# Patient Record
Sex: Male | Born: 1955 | Hispanic: No | Marital: Married | Smoking: Former smoker
Health system: Southern US, Community
[De-identification: ages and names within clinical notes are randomized; demographics above are authoritative.]

## PROBLEM LIST (undated history)

## (undated) DIAGNOSIS — K56609 Unspecified intestinal obstruction, unspecified as to partial versus complete obstruction: Secondary | ICD-10-CM

## (undated) DIAGNOSIS — I10 Essential (primary) hypertension: Secondary | ICD-10-CM

## (undated) DIAGNOSIS — E1110 Type 2 diabetes mellitus with ketoacidosis without coma: Secondary | ICD-10-CM

## (undated) DIAGNOSIS — N179 Acute kidney failure, unspecified: Secondary | ICD-10-CM

## (undated) DIAGNOSIS — E119 Type 2 diabetes mellitus without complications: Secondary | ICD-10-CM

## (undated) DIAGNOSIS — E785 Hyperlipidemia, unspecified: Secondary | ICD-10-CM

## (undated) HISTORY — PX: ESOPHAGOGASTRODUODENOSCOPY ENDOSCOPY: SHX5814

## (undated) HISTORY — PX: TONSILLECTOMY: SUR1361

## (undated) HISTORY — PX: WRIST SURGERY: SHX841

---

## 1998-05-31 ENCOUNTER — Other Ambulatory Visit: Admission: RE | Admit: 1998-05-31 | Discharge: 1998-05-31 | Payer: Self-pay | Admitting: Emergency Medicine

## 2009-01-18 ENCOUNTER — Inpatient Hospital Stay (HOSPITAL_COMMUNITY): Admission: EM | Admit: 2009-01-18 | Discharge: 2009-01-19 | Payer: Self-pay | Admitting: Emergency Medicine

## 2011-02-26 LAB — LIPID PANEL
HDL: 42 mg/dL (ref >39)
LDL Cholesterol: 91 mg/dL (ref 0–99)
Total CHOL/HDL Ratio: 3.7 RATIO
Triglycerides: 106 mg/dL (ref <150)
VLDL: 21 mg/dL (ref 0–40)

## 2011-02-26 LAB — BASIC METABOLIC PANEL
GFR calc Af Amer: >60 mL/min (ref >60)
GFR calc non Af Amer: >60 mL/min (ref >60)
Potassium: 4.2 mEq/L (ref 3.5–5.1)
Sodium: 139 mEq/L (ref 135–145)

## 2011-02-26 LAB — CBC
HCT: 42.5 % (ref 39.0–52.0)
Hemoglobin: 14.7 g/dL (ref 13.0–17.0)
RBC: 4.86 MIL/uL (ref 4.22–5.81)
WBC: 4.8 10*3/uL (ref 4.0–10.5)

## 2011-02-26 LAB — GLUCOSE, CAPILLARY: Glucose-Capillary: 118 mg/dL — ABNORMAL HIGH (ref 70–99)

## 2011-02-26 LAB — CK TOTAL AND CKMB (NOT AT ARMC)
CK, MB: 1 ng/mL (ref 0.3–4.0)
Total CK: 123 U/L (ref 7–232)

## 2011-02-26 LAB — HEMOGLOBIN A1C
Hgb A1c MFr Bld: 6 % (ref 4.6–6.1)
Mean Plasma Glucose: 126 mg/dL

## 2011-02-26 LAB — DIFFERENTIAL
Eosinophils Relative: 3 % (ref 0–5)
Lymphocytes Relative: 40 % (ref 12–46)
Lymphs Abs: 1.9 10*3/uL (ref 0.7–4.0)
Monocytes Absolute: 0.4 10*3/uL (ref 0.1–1.0)

## 2011-02-26 LAB — POCT I-STAT, CHEM 8
BUN: 7 mg/dL (ref 6–23)
Chloride: 106 mEq/L (ref 96–112)
HCT: 39 % (ref 39.0–52.0)
Sodium: 138 mEq/L (ref 135–145)
TCO2: 24 mmol/L (ref 0–100)

## 2011-02-26 LAB — CARDIAC PANEL(CRET KIN+CKTOT+MB+TROPI)
CK, MB: 0.8 ng/mL (ref 0.3–4.0)
Total CK: 110 U/L (ref 7–232)

## 2011-02-26 LAB — TSH: TSH: 1.925 u[IU]/mL (ref 0.350–4.500)

## 2011-02-26 LAB — BRAIN NATRIURETIC PEPTIDE: Pro B Natriuretic peptide (BNP): <30 pg/mL (ref 0.0–100.0)

## 2011-02-26 LAB — APTT: aPTT: 32 seconds (ref 24–37)

## 2011-02-26 LAB — POCT CARDIAC MARKERS: Troponin i, poc: <0.05 ng/mL (ref 0.00–0.09)

## 2011-03-31 NOTE — H&P (Signed)
NAME:  Jeremiah Bowman, Jeremiah Bowman NO.:  [id number]   MEDICAL RECORD NO.:  [id number]          PATIENT TYPE:  EMS   LOCATION:  [facility]JO                         FACILITY:  [facility]   PHYSICIAN:  Lucita Ferrara, MD         DATE OF BIRTH:  09/17/1956   DATE OF ADMISSION:  01/18/2009  DATE OF DISCHARGE:                              HISTORY & PHYSICAL   CHIEF COMPLAINT:  Chest pain.  The patient is a 55 year old African male  with chest pain located in the left anterior precordial area and  substernal area, nonradiating, it has been going on for the last 24  hours, worse upon exertion, aching, dull, pressure-like in its  characterization, 4/10 in intensity, not aggravated by p.o. intake,  there is no gastric symptoms.  No reproducibility or change with body  position.  No fevers, chills, cough.  No family history of coronary  artery disease; however, other risk factors include hypertension,  diabetes, hyperlipidemia.   FAMILY HISTORY:  Negative for premature coronary disease.   PAST SURGICAL HISTORY:  None.   SOCIAL HISTORY:  Nonsmoker, nondrinker.  Denies drugs.   ALLERGIES:  No known drug allergies.   MEDICATIONS:  Aspirin, lisinopril, metformin, Pravachol, Prevacid,  verapamil.   REVIEW OF SYSTEMS:  As per HPI, otherwise negative.   PHYSICAL EXAMINATION:  Generally speaking, the patient is in no acute  distress.  Blood pressure 155/95, pulse 76, respirations 23, temperature  98.1, pulse oximetry 95% on room air.  HEENT:  Normocephalic, atraumatic.  Sclerae is anicteric.  NECK:  Supple.  No JVD, no carotid bruits.  PERLA.  Extraocular muscles  intact.  CARDIOVASCULAR:  S1-S2, regular rate and rhythm.  No murmurs, rubs or  clicks.  LUNGS:  Clear to auscultation bilaterally.  No rhonchi, rales or wheeze.  ABDOMEN:  Obese, soft, nontender, nondistended.  Positive bowel sounds.  EXTREMITIES:  No clubbing, cyanosis or edema.  NEURO:  The patient is alert and oriented x3.  Cranial  nerves II-XII  grossly intact.   LABORATORY DATA:  Basic metabolic panel within normal limits.  Beta  natriuretic peptide less than 30.  Cardiac markers negative.   ASSESSMENT/PLAN:  The patient is a 55 year old with a really atypical  type of chest pain, relatively low risk.  The risk factors, however, do  include diabetes, hypertension which is not well-controlled.  Will go  ahead and admit the patient for observational stay for rule out  myocardial infarction, cardiac enzymes times three every 8 hours.  Will  put the patient on aspirin 325 mg, beta blocker 12.5 mg by mouth two  times daily.  The patient should be risk stratified.  The patient would  likely benefit from an outpatient stress test prior to leaving the  hospital.  Deep vein thrombosis prophylaxis with Lovenox, GI prophylaxis  with Protonix.      Lucita Ferrara, MD  Electronically Signed     RR/MEDQ  D:  01/18/2009  T:  01/18/2009  Job:  [id number]

## 2011-03-31 NOTE — Discharge Summary (Signed)
NAME:  Jeremiah Bowman, Jeremiah Bowman NO.:  [id number]   MEDICAL RECORD NO.:  [id number]          PATIENT TYPE:  INP   LOCATION:  4705                         FACILITY:  [facility]   PHYSICIAN:  Peggye Pitt, M.D. DATE OF BIRTH:  Jan 30, 1956   DATE OF ADMISSION:  01/18/2009  DATE OF DISCHARGE:  01/19/2009                               DISCHARGE SUMMARY   DISCHARGE DIAGNOSES:  1. Chest pain, ruled out for acute myocardial infarction.  2. Type 2 diabetes mellitus.  3. Hypertension.  4. Hyperlipidemia.   DISCHARGE MEDICATIONS:  1. Aspirin 81 mg daily.  2. Verapamil 180 mg daily.  3. Lisinopril/Hydrochlorothiazide 20/12.5 mg daily.  4. Metformin 1000 mg twice daily.  5. Pravachol 20 mg daily.   DISPOSITION AND FOLLOWUP:  Jeremiah Bowman is discharged home in stable  condition.  He has no longer had any chest pain.  Given his risk  factors, I would like him to have an outpatient stress test and I have  called Dr. Verdis Prime to schedule this.  I am still waiting for a call  back by him.  Nonetheless, the patient has been givien Dr. Michaelle Copas  office number [phone] to call on Monday and make sure that this  appointment has been arranged for him.   CONSULTATION THIS HOSPITALIZATION:  None.   IMAGES AND PROCEDURES PERFORMED:  Chest x-ray on January 18, 2009, that  showed no acute cardiopulmonary abnormality.   HISTORY AND PHYSICAL EXAMINATION:  For full details refer the dictation  by Dr. Purcell Mouton on January 18, 2009, but in brief, Jeremiah Bowman is a 55-year-  old African American man who presented to the hospital with chest pain  located in the left anterior precordial area, which was nonradiating  which have been going on for the prior 24 hours, intermittent, no  aggravating or alleviating factors.  He states that he has had this  chest pain on and off for the past month.  His coronary artery disease  risk factors include hypertension, hyperlipidemia, and diabetes.   HOSPITAL COURSE BY  PROBLEM:  1. Chest pain.  He has ruled out for an acute myocardial infarction by      3 sets of negative cardiac enzymes as well as a completely normal      EKG with no acute ST-T wave changes.  However, given a numerous      coronary artery disease risk factors, I have recommended that he      get an outpatient stress test.  I have called the cardiologist on      call Dr. Katrinka Blazing and are currently waiting for a call back.  In the      meantime, I have given Jeremiah Bowman, Dr. Michaelle Copas office number to      call on Monday and make sure this appointment has been arranged for      him.  2. Hyperlipidemia.  He has had an excellent fasting lipid profile      while in the hospital.  We continued his statin at home dose.  His      fasting lipid profile  was as followed, total cholesterol 154,      triglycerides 106, HDL 42, and an LDL of 91.  3. Hypertension.  He has obtained blood pressure control in the      hospital.  Continue on his home medications.  4. Type 2 diabetes mellitus, which is very well controlled with the      hemoglobin A1c of 6.0.  He will continue his metformin in the      outpatient setting.   VITAL SIGNS ON DAY OF DISCHARGE:  Blood pressure 123/82, heart rate 82,  respirations 18, O2 sat 95% on room air with temp of 98.4.      Peggye Pitt, M.D.  Electronically Signed     EH/MEDQ  D:  01/19/2009  T:  01/20/2009  Job:  [id number]   cc:   Maren Beach, M.D.

## 2013-03-01 ENCOUNTER — Encounter: Payer: Self-pay | Admitting: Gastroenterology

## 2013-05-01 ENCOUNTER — Encounter: Payer: Self-pay | Admitting: Gastroenterology

## 2013-05-24 ENCOUNTER — Encounter: Payer: Self-pay | Admitting: Gastroenterology

## 2015-10-17 DIAGNOSIS — K56609 Unspecified intestinal obstruction, unspecified as to partial versus complete obstruction: Secondary | ICD-10-CM

## 2015-10-17 HISTORY — DX: Unspecified intestinal obstruction, unspecified as to partial versus complete obstruction: K56.609

## 2015-10-24 ENCOUNTER — Inpatient Hospital Stay (HOSPITAL_COMMUNITY)
Admission: EM | Admit: 2015-10-24 | Discharge: 2015-10-27 | DRG: 388 | Disposition: A | Discharge status: Home / Self Care | Payer: Self-pay | Attending: Internal Medicine | Admitting: Internal Medicine

## 2015-10-24 ENCOUNTER — Encounter (HOSPITAL_COMMUNITY): Payer: Self-pay

## 2015-10-24 ENCOUNTER — Emergency Department (HOSPITAL_COMMUNITY): Payer: Self-pay

## 2015-10-24 DIAGNOSIS — E872 Acidosis, unspecified: Secondary | ICD-10-CM

## 2015-10-24 DIAGNOSIS — K56609 Unspecified intestinal obstruction, unspecified as to partial versus complete obstruction: Secondary | ICD-10-CM | POA: Diagnosis present

## 2015-10-24 DIAGNOSIS — K922 Gastrointestinal hemorrhage, unspecified: Secondary | ICD-10-CM | POA: Diagnosis present

## 2015-10-24 DIAGNOSIS — A419 Sepsis, unspecified organism: Secondary | ICD-10-CM | POA: Diagnosis present

## 2015-10-24 DIAGNOSIS — Z87891 Personal history of nicotine dependence: Secondary | ICD-10-CM

## 2015-10-24 DIAGNOSIS — E860 Dehydration: Secondary | ICD-10-CM | POA: Diagnosis present

## 2015-10-24 DIAGNOSIS — E669 Obesity, unspecified: Secondary | ICD-10-CM | POA: Diagnosis present

## 2015-10-24 DIAGNOSIS — R109 Unspecified abdominal pain: Secondary | ICD-10-CM | POA: Diagnosis present

## 2015-10-24 DIAGNOSIS — Z6841 Body Mass Index (BMI) 40.0 and over, adult: Secondary | ICD-10-CM

## 2015-10-24 DIAGNOSIS — K56600 Partial intestinal obstruction, unspecified as to cause: Secondary | ICD-10-CM | POA: Insufficient documentation

## 2015-10-24 DIAGNOSIS — R339 Retention of urine, unspecified: Secondary | ICD-10-CM | POA: Diagnosis present

## 2015-10-24 DIAGNOSIS — Z0189 Encounter for other specified special examinations: Secondary | ICD-10-CM

## 2015-10-24 DIAGNOSIS — R112 Nausea with vomiting, unspecified: Secondary | ICD-10-CM | POA: Diagnosis present

## 2015-10-24 DIAGNOSIS — R6520 Severe sepsis without septic shock: Secondary | ICD-10-CM | POA: Diagnosis present

## 2015-10-24 DIAGNOSIS — R338 Other retention of urine: Secondary | ICD-10-CM | POA: Diagnosis present

## 2015-10-24 DIAGNOSIS — E785 Hyperlipidemia, unspecified: Secondary | ICD-10-CM | POA: Diagnosis present

## 2015-10-24 DIAGNOSIS — F1020 Alcohol dependence, uncomplicated: Secondary | ICD-10-CM | POA: Diagnosis present

## 2015-10-24 DIAGNOSIS — D72829 Elevated white blood cell count, unspecified: Secondary | ICD-10-CM | POA: Diagnosis present

## 2015-10-24 DIAGNOSIS — I10 Essential (primary) hypertension: Secondary | ICD-10-CM | POA: Diagnosis present

## 2015-10-24 DIAGNOSIS — E119 Type 2 diabetes mellitus without complications: Secondary | ICD-10-CM

## 2015-10-24 DIAGNOSIS — I959 Hypotension, unspecified: Secondary | ICD-10-CM | POA: Diagnosis present

## 2015-10-24 DIAGNOSIS — N179 Acute kidney failure, unspecified: Secondary | ICD-10-CM

## 2015-10-24 HISTORY — DX: Essential (primary) hypertension: I10

## 2015-10-24 HISTORY — DX: Hyperlipidemia, unspecified: E78.5

## 2015-10-24 HISTORY — DX: Type 2 diabetes mellitus without complications: E11.9

## 2015-10-24 HISTORY — DX: Unspecified intestinal obstruction, unspecified as to partial versus complete obstruction: K56.609

## 2015-10-24 LAB — CBC WITH DIFFERENTIAL/PLATELET
BASOS ABS: 0 10*3/uL (ref 0.0–0.1)
BASOS ABS: 0 10*3/uL (ref 0.0–0.1)
Basophils Relative: 0 %
Basophils Relative: 0 %
EOS ABS: 0 10*3/uL (ref 0.0–0.7)
Eosinophils Absolute: 0.1 10*3/uL (ref 0.0–0.7)
Eosinophils Relative: 0 %
Eosinophils Relative: 1 %
HCT: 48 % (ref 39.0–52.0)
HEMATOCRIT: 39.2 % (ref 39.0–52.0)
Hemoglobin: 16.5 g/dL (ref 13.0–17.0)
Hemoglobin: 13.6 g/dL (ref 13.0–17.0)
LYMPHS ABS: 0.8 10*3/uL (ref 0.7–4.0)
LYMPHS ABS: 0.9 10*3/uL (ref 0.7–4.0)
Lymphocytes Relative: 6 %
Lymphocytes Relative: 10 %
MCH: 29.8 pg (ref 26.0–34.0)
MCH: 29.8 pg (ref 26.0–34.0)
MCHC: 34.4 g/dL (ref 30.0–36.0)
MCHC: 34.7 g/dL (ref 30.0–36.0)
MCV: 86.8 fL (ref 78.0–100.0)
MCV: 86 fL (ref 78.0–100.0)
MONO ABS: 1.5 10*3/uL — AB (ref 0.1–1.0)
MONOS PCT: 11 %
Monocytes Absolute: 1.4 10*3/uL — ABNORMAL HIGH (ref 0.1–1.0)
Monocytes Relative: 17 %
Neutro Abs: 10.8 10*3/uL — ABNORMAL HIGH (ref 1.7–7.7)
Neutro Abs: 6.4 10*3/uL (ref 1.7–7.7)
Neutrophils Relative %: 83 %
Neutrophils Relative %: 72 %
PLATELETS: 320 10*3/uL (ref 150–400)
Platelets: 230 10*3/uL (ref 150–400)
RBC: 5.53 MIL/uL (ref 4.22–5.81)
RBC: 4.56 MIL/uL (ref 4.22–5.81)
RDW: 13.4 % (ref 11.5–15.5)
RDW: 13.5 % (ref 11.5–15.5)
WBC Morphology: INCREASED
WBC Morphology: INCREASED
WBC: 13 10*3/uL — AB (ref 4.0–10.5)
WBC: 8.9 10*3/uL (ref 4.0–10.5)

## 2015-10-24 LAB — PROTIME-INR
INR: 1.19 (ref 0.00–1.49)
INR: 1.16 (ref 0.00–1.49)
PROTHROMBIN TIME: 15.2 s (ref 11.6–15.2)
PROTHROMBIN TIME: 15 s (ref 11.6–15.2)

## 2015-10-24 LAB — COMPREHENSIVE METABOLIC PANEL
ALT: 34 U/L (ref 17–63)
AST: 31 U/L (ref 15–41)
Albumin: 4.3 g/dL (ref 3.5–5.0)
Alkaline Phosphatase: 75 U/L (ref 38–126)
Anion gap: 19 — ABNORMAL HIGH (ref 5–15)
BILIRUBIN TOTAL: 1.7 mg/dL — AB (ref 0.3–1.2)
BUN: 27 mg/dL — ABNORMAL HIGH (ref 6–20)
CHLORIDE: 97 mmol/L — AB (ref 101–111)
CO2: 21 mmol/L — ABNORMAL LOW (ref 22–32)
CREATININE: 1.98 mg/dL — AB (ref 0.61–1.24)
Calcium: 11 mg/dL — ABNORMAL HIGH (ref 8.9–10.3)
GFR, EST AFRICAN AMERICAN: 41 mL/min — AB (ref >60)
GFR, EST NON AFRICAN AMERICAN: 35 mL/min — AB (ref >60)
Glucose, Bld: 413 mg/dL — ABNORMAL HIGH (ref 65–99)
POTASSIUM: 4.1 mmol/L (ref 3.5–5.1)
Sodium: 137 mmol/L (ref 135–145)
TOTAL PROTEIN: 8.8 g/dL — AB (ref 6.5–8.1)

## 2015-10-24 LAB — BASIC METABOLIC PANEL
ANION GAP: 8 (ref 5–15)
BUN: 20 mg/dL (ref 6–20)
CALCIUM: 9.4 mg/dL (ref 8.9–10.3)
CO2: 27 mmol/L (ref 22–32)
Chloride: 109 mmol/L (ref 101–111)
Creatinine, Ser: 1.13 mg/dL (ref 0.61–1.24)
Glucose, Bld: 200 mg/dL — ABNORMAL HIGH (ref 65–99)
POTASSIUM: 3.7 mmol/L (ref 3.5–5.1)
Sodium: 144 mmol/L (ref 135–145)

## 2015-10-24 LAB — POC OCCULT BLOOD, ED: FECAL OCCULT BLD: NEGATIVE

## 2015-10-24 LAB — T4, FREE: FREE T4: 0.94 ng/dL (ref 0.61–1.12)

## 2015-10-24 LAB — TYPE AND SCREEN
ABO/RH(D): A POS
Antibody Screen: NEGATIVE

## 2015-10-24 LAB — I-STAT CHEM 8, ED
BUN: 31 mg/dL — AB (ref 6–20)
CHLORIDE: 98 mmol/L — AB (ref 101–111)
Calcium, Ion: 1.13 mmol/L (ref 1.13–1.30)
Creatinine, Ser: 1.7 mg/dL — ABNORMAL HIGH (ref 0.61–1.24)
Glucose, Bld: 431 mg/dL — ABNORMAL HIGH (ref 65–99)
HEMATOCRIT: 54 % — AB (ref 39.0–52.0)
Hemoglobin: 18.4 g/dL — ABNORMAL HIGH (ref 13.0–17.0)
Potassium: 3.8 mmol/L (ref 3.5–5.1)
SODIUM: 137 mmol/L (ref 135–145)
TCO2: 23 mmol/L (ref 0–100)

## 2015-10-24 LAB — LACTIC ACID, PLASMA
LACTIC ACID, VENOUS: 2.3 mmol/L — AB (ref 0.5–2.0)
LACTIC ACID, VENOUS: 1.6 mmol/L (ref 0.5–2.0)

## 2015-10-24 LAB — MRSA PCR SCREENING: MRSA BY PCR: NEGATIVE

## 2015-10-24 LAB — I-STAT CG4 LACTIC ACID, ED
LACTIC ACID, VENOUS: 5.78 mmol/L — AB (ref 0.5–2.0)
LACTIC ACID, VENOUS: 2.46 mmol/L — AB (ref 0.5–2.0)
Lactic Acid, Venous: 5.9 mmol/L (ref 0.5–2.0)

## 2015-10-24 LAB — GLUCOSE, CAPILLARY: Glucose-Capillary: 160 mg/dL — ABNORMAL HIGH (ref 65–99)

## 2015-10-24 LAB — ABO/RH: ABO/RH(D): A POS

## 2015-10-24 LAB — TSH: TSH: 1.16 u[IU]/mL (ref 0.350–4.500)

## 2015-10-24 LAB — APTT: aPTT: 24 seconds (ref 24–37)

## 2015-10-24 LAB — CBG MONITORING, ED: Glucose-Capillary: 206 mg/dL — ABNORMAL HIGH (ref 65–99)

## 2015-10-24 LAB — LIPASE, BLOOD: LIPASE: 26 U/L (ref 11–51)

## 2015-10-24 LAB — PROCALCITONIN: PROCALCITONIN: 1.05 ng/mL

## 2015-10-24 MED ORDER — MORPHINE SULFATE (PF) 2 MG/ML IV SOLN
2.0000 mg | INTRAVENOUS | Status: DC | PRN
  Administered 2015-10-25: 2 mg via INTRAVENOUS
  Filled 2015-10-24: qty 1

## 2015-10-24 MED ORDER — IOHEXOL 300 MG/ML  SOLN
25.0000 mL | Freq: Once | INTRAMUSCULAR | Status: AC | PRN
  Administered 2015-10-24: 25 mL via ORAL

## 2015-10-24 MED ORDER — SODIUM CHLORIDE 0.9 % IV BOLUS (SEPSIS)
1000.0000 mL | Freq: Once | INTRAVENOUS | Status: AC
  Administered 2015-10-24: 1000 mL via INTRAVENOUS

## 2015-10-24 MED ORDER — PIPERACILLIN-TAZOBACTAM 3.375 G IVPB 30 MIN
3.3750 g | Freq: Once | INTRAVENOUS | Status: AC
  Administered 2015-10-24: 3.375 g via INTRAVENOUS
  Filled 2015-10-24: qty 50

## 2015-10-24 MED ORDER — METOPROLOL TARTRATE 1 MG/ML IV SOLN
2.5000 mg | INTRAVENOUS | Status: DC | PRN
  Administered 2015-10-25: 2.5 mg via INTRAVENOUS
  Filled 2015-10-24: qty 5

## 2015-10-24 MED ORDER — SODIUM CHLORIDE 0.9 % IV BOLUS (SEPSIS)
1000.0000 mL | Freq: Once | INTRAVENOUS | Status: AC
Start: 2015-10-24 — End: 2015-10-24
  Administered 2015-10-24: 1000 mL via INTRAVENOUS

## 2015-10-24 MED ORDER — LACTATED RINGERS IV SOLN
INTRAVENOUS | Status: DC
  Administered 2015-10-24: 17:00:00 via INTRAVENOUS

## 2015-10-24 MED ORDER — PHENOL 1.4 % MT LIQD
1.0000 | OROMUCOSAL | Status: DC | PRN
  Filled 2015-10-24: qty 177

## 2015-10-24 MED ORDER — MORPHINE SULFATE (PF) 4 MG/ML IV SOLN
4.0000 mg | Freq: Once | INTRAVENOUS | Status: AC
  Administered 2015-10-24: 4 mg via INTRAVENOUS
  Filled 2015-10-24: qty 1

## 2015-10-24 MED ORDER — THIAMINE HCL 100 MG/ML IJ SOLN
100.0000 mg | Freq: Every day | INTRAMUSCULAR | Status: DC
  Administered 2015-10-25 – 2015-10-26 (×2): 100 mg via INTRAVENOUS
  Filled 2015-10-24 (×2): qty 2

## 2015-10-24 MED ORDER — INSULIN ASPART 100 UNIT/ML ~~LOC~~ SOLN
0.0000 [IU] | SUBCUTANEOUS | Status: DC
  Administered 2015-10-24: 3 [IU] via SUBCUTANEOUS
  Administered 2015-10-24: 2 [IU] via SUBCUTANEOUS
  Administered 2015-10-25: 1 [IU] via SUBCUTANEOUS
  Administered 2015-10-25 (×2): 2 [IU] via SUBCUTANEOUS
  Administered 2015-10-25: 1 [IU] via SUBCUTANEOUS
  Administered 2015-10-25 (×2): 2 [IU] via SUBCUTANEOUS
  Administered 2015-10-26: 1 [IU] via SUBCUTANEOUS

## 2015-10-24 MED ORDER — SODIUM CHLORIDE 0.9 % IV SOLN
INTRAVENOUS | Status: DC
  Administered 2015-10-24 – 2015-10-26 (×3): via INTRAVENOUS
  Administered 2015-10-26: 1 mL via INTRAVENOUS
  Administered 2015-10-26: 13:00:00 via INTRAVENOUS

## 2015-10-24 MED ORDER — PIPERACILLIN-TAZOBACTAM 3.375 G IVPB 30 MIN: 3.3750 g | Freq: Once | INTRAVENOUS | Status: DC

## 2015-10-24 MED ORDER — LORAZEPAM 2 MG/ML IJ SOLN: 1.0000 mg | Freq: Four times a day (QID) | INTRAMUSCULAR | Status: DC | PRN

## 2015-10-24 MED ORDER — VITAMIN B-1 100 MG PO TABS
100.0000 mg | ORAL_TABLET | Freq: Every day | ORAL | Status: DC
  Administered 2015-10-27: 100 mg via ORAL
  Filled 2015-10-24: qty 1

## 2015-10-24 MED ORDER — ONDANSETRON HCL 4 MG/2ML IJ SOLN
4.0000 mg | Freq: Once | INTRAMUSCULAR | Status: AC
  Administered 2015-10-24: 4 mg via INTRAVENOUS
  Filled 2015-10-24: qty 2

## 2015-10-24 MED ORDER — PIPERACILLIN-TAZOBACTAM 3.375 G IVPB
3.3750 g | Freq: Three times a day (TID) | INTRAVENOUS | Status: DC
  Administered 2015-10-24 – 2015-10-26 (×6): 3.375 g via INTRAVENOUS
  Filled 2015-10-24 (×8): qty 50

## 2015-10-24 MED ORDER — INSULIN ASPART 100 UNIT/ML ~~LOC~~ SOLN: 0.0000 [IU] | Freq: Three times a day (TID) | SUBCUTANEOUS | Status: DC

## 2015-10-24 MED ORDER — DIATRIZOATE MEGLUMINE & SODIUM 66-10 % PO SOLN
ORAL | Status: AC
  Administered 2015-10-24: 16:00:00
  Filled 2015-10-24: qty 90

## 2015-10-24 MED ORDER — DIATRIZOATE MEGLUMINE & SODIUM 66-10 % PO SOLN
90.0000 mL | Freq: Once | ORAL | Status: AC
  Administered 2015-10-24: 90 mL via NASOGASTRIC

## 2015-10-24 MED ORDER — LORAZEPAM 1 MG PO TABS: 1.0000 mg | ORAL_TABLET | Freq: Four times a day (QID) | ORAL | Status: DC | PRN

## 2015-10-24 MED ORDER — PANTOPRAZOLE SODIUM 40 MG IV SOLR
80.0000 mg | Freq: Once | INTRAVENOUS | Status: AC
  Administered 2015-10-24: 80 mg via INTRAVENOUS
  Filled 2015-10-24: qty 80

## 2015-10-24 MED ORDER — ONDANSETRON HCL 4 MG/2ML IJ SOLN
4.0000 mg | Freq: Four times a day (QID) | INTRAMUSCULAR | Status: DC | PRN
  Administered 2015-10-26: 4 mg via INTRAVENOUS
  Filled 2015-10-24 (×2): qty 2

## 2015-10-24 MED ORDER — HEPARIN SODIUM (PORCINE) 5000 UNIT/ML IJ SOLN
5000.0000 [IU] | Freq: Three times a day (TID) | INTRAMUSCULAR | Status: DC
  Administered 2015-10-24 – 2015-10-27 (×9): 5000 [IU] via SUBCUTANEOUS
  Filled 2015-10-24 (×8): qty 1

## 2015-10-24 MED ORDER — ONDANSETRON HCL 4 MG PO TABS: 4.0000 mg | ORAL_TABLET | Freq: Four times a day (QID) | ORAL | Status: DC | PRN

## 2015-10-24 MED ORDER — INSULIN ASPART 100 UNIT/ML ~~LOC~~ SOLN: 10.0000 [IU] | Freq: Once | SUBCUTANEOUS | Status: DC

## 2015-10-24 NOTE — ED Notes (Signed)
General surgery at bedside. 

## 2015-10-24 NOTE — ED Notes (Signed)
Shown lactic acid to dr.goldston

## 2015-10-24 NOTE — ED Provider Notes (Signed)
CSN: [id number]     Arrival date & time 10/24/15  0940 History   First MD Initiated Contact with Patient 10/24/15 1000     Chief Complaint  Patient presents with  . Hypotension  . Emesis     (Consider location/radiation/quality/duration/timing/severity/associated sxs/prior Treatment) HPI  59year-old male presents with abdominal pain x 2 days. He states that the pain is epigastric and is also expressing heartburn. Today he has vomited twice, both times the emesis was black. No bowel movement in the last 2 days. He went to urgent care but was sent here because they could not get a blood pressure. He started having diaphoresis and worse dizziness today. Patient states he drinks about 8 shots of brandy per week. Denies any known liver problems. Has been using ibuprofen intermittently over the past 1 week for a headache, states he uses 2 tablets about once per day over a few days. Denies any known stomach problems.  Past Medical History  Diagnosis Date  . Diabetes ([place])   . Hypertension   . Hyperlipidemia    History reviewed. No pertinent past surgical history. History reviewed. No pertinent family history. Social History  Substance Use Topics  . Smoking status: Former Research scientist (life sciences)  . Smokeless tobacco: None  . Alcohol Use: Yes     Comment: 8 shots of brandy a week    Review of Systems  Constitutional: Positive for diaphoresis. Negative for fever.  Respiratory: Negative for shortness of breath.   Gastrointestinal: Positive for nausea, vomiting, abdominal pain, constipation and abdominal distention.  Neurological: Positive for dizziness. Negative for syncope.  All other systems reviewed and are negative.     Allergies  Review of patient's allergies indicates no known allergies.  Home Medications   Prior to Admission medications   Not on File   BP 118/108 mmHg  Pulse 134  Temp(Src) 98.1 F (36.7 C) (Oral)  Resp 13  Ht 5' (1.524 m)  Wt 210 lb (95.255 kg)  BMI 41.01 kg/m2   SpO2 97% Physical Exam  Constitutional: He is oriented to person, place, and time. He appears well-developed and well-nourished. No distress.  HENT:  Head: Normocephalic and atraumatic.  Right Ear: External ear normal.  Left Ear: External ear normal.  Nose: Nose normal.  Eyes: Right eye exhibits no discharge. Left eye exhibits no discharge.  Neck: Neck supple.  Cardiovascular: Regular rhythm, normal heart sounds and intact distal pulses.  Tachycardia present.   Pulmonary/Chest: Effort normal and breath sounds normal.  Abdominal: Soft. He exhibits distension. There is tenderness in the left upper quadrant. There is no rigidity.  Mild generalized tenderness, worst in LUQ  Musculoskeletal: He exhibits no edema.  Neurological: He is alert and oriented to person, place, and time.  Skin: Skin is warm. He is diaphoretic.  Nursing note and vitals reviewed.   ED Course  Procedures (including critical care time) Labs Review Labs Reviewed  COMPREHENSIVE METABOLIC PANEL - Abnormal; Notable for the following:    Chloride 97 (*)    CO2 21 (*)    Glucose, Bld 413 (*)    BUN 27 (*)    Creatinine, Ser 1.98 (*)    Calcium 11.0 (*)    Total Protein 8.8 (*)    Total Bilirubin 1.7 (*)    GFR calc non Af Amer 35 (*)    GFR calc Af Amer 41 (*)    Anion gap 19 (*)    All other components within normal limits  CBC WITH DIFFERENTIAL/PLATELET - Abnormal;  Notable for the following:    WBC 13.0 (*)    Neutro Abs 10.8 (*)    Monocytes Absolute 1.4 (*)    All other components within normal limits  I-STAT CG4 LACTIC ACID, ED - Abnormal; Notable for the following:    Lactic Acid, Venous 5.90 (*)    All other components within normal limits  I-STAT CHEM 8, ED - Abnormal; Notable for the following:    Chloride 98 (*)    BUN 31 (*)    Creatinine, Ser 1.70 (*)    Glucose, Bld 431 (*)    Hemoglobin 18.4 (*)    HCT 54.0 (*)    All other components within normal limits  I-STAT CG4 LACTIC ACID, ED -  Abnormal; Notable for the following:    Lactic Acid, Venous 5.78 (*)    All other components within normal limits  CULTURE, BLOOD (ROUTINE X 2)  CULTURE, BLOOD (ROUTINE X 2)  LIPASE, BLOOD  PROTIME-INR  LACTIC ACID, PLASMA  CBC WITH DIFFERENTIAL/PLATELET  LACTIC ACID, PLASMA  LACTIC ACID, PLASMA  PROCALCITONIN  PROTIME-INR  APTT  HEMOGLOBIN A1C  POC OCCULT BLOOD, ED  I-STAT CG4 LACTIC ACID, ED  TYPE AND SCREEN  ABO/RH    Imaging Review Ct Abdomen Pelvis Wo Contrast  10/24/2015  CLINICAL DATA:  Epigastric pain starting this morning. Coffee ground emesis. Diaphoresis. EXAM: CT ABDOMEN AND PELVIS WITHOUT CONTRAST TECHNIQUE: Multidetector CT imaging of the abdomen and pelvis was performed following the standard protocol without IV contrast. COMPARISON:  None. FINDINGS: Lower chest: Lung bases show subpleural nodules measuring up to 7 mm in the lateral segment right middle lobe (series 3, image 4). Minimal subsegmental atelectasis is seen dependently in both lower lobes. Heart size normal. No pericardial or pleural effusion. Nasogastric tube is seen in the distal esophagus. Hepatobiliary: Liver appears slightly decreased in attenuation diffusely. Liver and gallbladder are otherwise unremarkable. No biliary ductal dilatation. Pancreas: Negative. Spleen: Negative. Adrenals/Urinary Tract: Right adrenal gland is unremarkable. Thickening of the left adrenal gland. Kidneys and ureters are unremarkable. Bladder is grossly unremarkable. Stomach/Bowel: Stomach is distended with oral contrast. Duodenum is decompressed. Jejunum is dilated with contrast seen only in the proximal jejunum. There is decompression of small bowel in the left lower quadrant. A discrete transition point is difficult to see due to lack of oral contrast in this area. Remainder of the small bowel is markedly decompressed, as is the colon. Vascular/Lymphatic: Vascular structures are unremarkable. No pathologically enlarged lymph nodes.  Reproductive: Prostate is at the upper limits of normal in size. Other: Small amount of fluid is seen in the anterior left anatomic pelvis (series 2, image 71). Scattered haziness in the right-sided omentum (series 2, images 41 and 43), nonspecific. No nodularity. No free air. Musculoskeletal: No worrisome lytic or sclerotic lesions. IMPRESSION: 1. Small bowel obstruction with transition point likely in the distal jejunum. Please see discussion above. 2. Trace ascites. 3. Liver appears fatty. 4. Mild haziness in the right sided omentum, nonspecific. 5. Subpleural nodules in the lung bases, likely subpleural lymph nodes. If the patient is at high risk for bronchogenic carcinoma, follow-up chest CT at 3-55months is recommended. If the patient is at low risk for bronchogenic carcinoma, follow-up chest CT at 6-12 months is recommended. This recommendation follows the consensus statement: Guidelines for Management of Small Pulmonary Nodules Detected on CT Scans: A Statement from the [place] as published in Radiology 2005; 237:395-400. Electronically Signed   By: Lorin Picket M.D.  On: 10/24/2015 14:18   Dg Chest Port 1 View  10/24/2015  CLINICAL DATA:  Abdominal pain and vomiting EXAM: PORTABLE CHEST 1 VIEW COMPARISON:  01/18/2009 FINDINGS: Normal heart size and mediastinal contours. Low volumes without acute infiltrate or edema. No effusion or pneumothorax. No acute osseous findings. Gas below the diaphragm appears intragastric. IMPRESSION: Negative low volume chest. Electronically Signed   By: Monte Fantasia M.D.   On: 10/24/2015 10:38   I have personally reviewed and evaluated these images and lab results as part of my medical decision-making.   EKG Interpretation   Date/Time:  Thursday October 24 2015 09:52:29 EST Ventricular Rate:  134 PR Interval:  150 QRS Duration: 76 QT Interval:  280 QTC Calculation: 418 R Axis:   23 Text Interpretation:  Sinus tachycardia Ventricular premature  complex  Probable left atrial enlargement Low voltage, precordial leads Borderline  T wave abnormalities Baseline wander in lead(s) II III aVR aVF rate is  faster, afib gone compared to 2010 Confirmed by Makisha Marrin  MD, Vonn Sliger ([id number])  on 10/24/2015 10:03:27 AM      CRITICAL CARE Performed by: Sherwood Gambler T   Total critical care time: 50 minutes  Critical care time was exclusive of separately billable procedures and treating other patients.  Critical care was necessary to treat or prevent imminent or life-threatening deterioration.  Critical care was time spent personally by me on the following activities: development of treatment plan with patient and/or surrogate as well as nursing, discussions with consultants, evaluation of patient's response to treatment, examination of patient, obtaining history from patient or surrogate, ordering and performing treatments and interventions, ordering and review of laboratory studies, ordering and review of radiographic studies, pulse oximetry and re-evaluation of patient's condition.  MDM   Final diagnoses:  Lactic acidosis  Acute kidney injury ([place])    10:53 AM patient appears to have upper GI bleeding, concern for possible gastric rupture. Patient has some distention but no rigidity. He has mild diffuse tenderness, worst in his left upper quadrant. Chest x-ray shows possible free air although this could all just be gastric bubble. Given high concern with lactate of almost 6 and poor overall appearance, surgery was consulted. They recommend rechecking lactate after 3 L IV fluid bolus as well as placing an NG tube and getting a CT with oral contrast. They will evaluate the patient.  Patient CT scan shows small bowel obstruction is likely the cause of his abdominal distention and vomiting. Patient has not been hypotensive but has been quite tachycardic. His diaphoresis resolved, pain improved, and he appears much better after fluids and pain medicine.  A large volume of brown material has come out of his NG tube. He will continued to be resuscitated with IV fluids. Lactate is unchanged after the initial 3 L IV fluid bolus. He was given 2 more liters and placed on a lactated Ringer's infusion. Surgery has evaluated due to comorbidities requests medicine admit. Initially, patient was given antibiotics to cover an intra-abdominal source given concern for possible perforation, may not be necessary now that it seems the patient is more having fluid loss as the cause of his lactate and tachycardia. Admit to medicine in the step down unit.    Sherwood Gambler, MD 10/24/15 [phone]

## 2015-10-24 NOTE — Consult Note (Signed)
[name] 01/18/1956  [id number].   Requesting MD: Dr. Sherwood Gambler Chief Complaint/Reason for Consult: free air, abdominal pain HPI: This is a 59 yo [place] male who has lived in the [place] 38 yrs.  2 days ago he began having chest pain with heart burn.  He has initially no other symptoms.  He was passing minimal flatus and had not had a BM now in 2 days.  He awoke this morning around 0500am with horrible chest pain and heart burn.  He states that his abdomen was very distended and looked like he was pregnant.  He threw up twice prior to arrival in the [facility].  He states it was dark.  He denies seeing any bright red blood.  He threw up once more here.  He c/o bad pain and was somewhat diaphoretic according to the EDP.  He does take ibuprofen a couple of times a week only and a daily baby ASA.  He had a plain CXR which was normal.  We were asked to see the patient due to tachycardia, diaphoresis, and abdominal pain and distention.    ROS : Please see HPI, otherwise negative   History reviewed. No pertinent family history.  Past Medical History  Diagnosis Date  . Diabetes ([place])   . Hypertension   . Hyperlipidemia     Past Surgical History  Procedure Laterality Date  . Tonsillectomy    . Wrist surgery      Social History:  reports that he has quit smoking. He does not have any smokeless tobacco history on file. He reports that he drinks about 9.6 oz of alcohol per week. He reports that he does not use illicit drugs.  Allergies: No Known Allergies   (Not in a hospital admission)  Blood pressure 121/90, pulse 113, temperature 98.1 F (36.7 C), temperature source Oral, resp. rate 23, height 5' (1.524 m), weight 95.255 kg (210 lb), SpO2 94 %. Physical Exam: General: pleasant, obese black male who is laying in bed in NAD HEENT: head is normocephalic, atraumatic.  Sclera are noninjected.  PERRL.  Ears and nose without any masses or lesions.  Mouth is pink Heart: regular rhythm, but  tachycardic in 1 teens.  Normal s1,s2. No obvious murmurs, gallops, or rubs noted.  Palpable radial and pedal pulses bilaterally Lungs: CTAB, no wheezes, rhonchi, or rales noted.  Respiratory effort nonlabored Abd: soft, NT,  Less distended after NGT placed,  Hypoactive BS, no masses, hernias, or organomegaly.  NGT was placed in my presence.  Between his emesis bag of 600cc and the over 2000cc he immediately put out after his NGT was placed, the patient's abdominal pain dissipated and resolved and his HR decreased from the 130s to at that time the 110-120s and is now in the low 100s. MS: all 4 extremities are symmetrical with no cyanosis, clubbing, or edema. Skin: warm and dry with no masses, lesions, or rashes Psych: A&Ox3 with an appropriate affect.    Results for orders placed or performed during the hospital encounter of 10/24/15 (from the past 48 hour(s))  Comprehensive metabolic panel     Status: Abnormal   Collection Time: 10/24/15 10:09 AM  Result Value Ref Range   Sodium 137 135 - 145 mmol/L   Potassium 4.1 3.5 - 5.1 mmol/L    Comment: SPECIMEN HEMOLYZED. HEMOLYSIS MAY AFFECT INTEGRITY OF RESULTS.   Chloride 97 (L) 101 - 111 mmol/L   CO2 21 (L) 22 - 32 mmol/L   Glucose, Bld 413 (H)  65 - 99 mg/dL   BUN 27 (H) 6 - 20 mg/dL   Creatinine, Ser 1.98 (H) 0.61 - 1.24 mg/dL   Calcium 11.0 (H) 8.9 - 10.3 mg/dL   Total Protein 8.8 (H) 6.5 - 8.1 g/dL   Albumin 4.3 3.5 - 5.0 g/dL   AST 31 15 - 41 U/L   ALT 34 17 - 63 U/L   Alkaline Phosphatase 75 38 - 126 U/L   Total Bilirubin 1.7 (H) 0.3 - 1.2 mg/dL   GFR calc non Af Amer 35 (L) >60 mL/min   GFR calc Af Amer 41 (L) >60 mL/min    Comment: (NOTE) The eGFR has been calculated using the CKD EPI equation. This calculation has not been validated in all clinical situations. eGFR's persistently <60 mL/min signify possible Chronic Kidney Disease.    Anion gap 19 (H) 5 - 15  Lipase, blood     Status: None   Collection Time: 10/24/15 10:09 AM   Result Value Ref Range   Lipase 26 11 - 51 U/L  CBC with Differential     Status: Abnormal   Collection Time: 10/24/15 10:09 AM  Result Value Ref Range   WBC 13.0 (H) 4.0 - 10.5 K/uL   RBC 5.53 4.22 - 5.81 MIL/uL   Hemoglobin 16.5 13.0 - 17.0 g/dL   HCT 48.0 39.0 - 52.0 %   MCV 86.8 78.0 - 100.0 fL   MCH 29.8 26.0 - 34.0 pg   MCHC 34.4 30.0 - 36.0 g/dL   RDW 13.4 11.5 - 15.5 %   Platelets 320 150 - 400 K/uL   Neutrophils Relative % 83 %   Lymphocytes Relative 6 %   Monocytes Relative 11 %   Eosinophils Relative 0 %   Basophils Relative 0 %   Neutro Abs 10.8 (H) 1.7 - 7.7 K/uL   Lymphs Abs 0.8 0.7 - 4.0 K/uL   Monocytes Absolute 1.4 (H) 0.1 - 1.0 K/uL   Eosinophils Absolute 0.0 0.0 - 0.7 K/uL   Basophils Absolute 0.0 0.0 - 0.1 K/uL   RBC Morphology POLYCHROMASIA PRESENT    WBC Morphology INCREASED BANDS (>20% BANDS)    Smear Review LARGE PLATELETS PRESENT   Protime-INR     Status: None   Collection Time: 10/24/15 10:09 AM  Result Value Ref Range   Prothrombin Time 15.2 11.6 - 15.2 seconds   INR 1.19 0.00 - 1.49  Type and screen     Status: None   Collection Time: 10/24/15 10:09 AM  Result Value Ref Range   ABO/RH(D) A POS    Antibody Screen NEG    Sample Expiration 10/27/2015   ABO/Rh     Status: None   Collection Time: 10/24/15 10:09 AM  Result Value Ref Range   ABO/RH(D) A POS   I-stat Chem 8, ED     Status: Abnormal   Collection Time: 10/24/15 10:28 AM  Result Value Ref Range   Sodium 137 135 - 145 mmol/L   Potassium 3.8 3.5 - 5.1 mmol/L   Chloride 98 (L) 101 - 111 mmol/L   BUN 31 (H) 6 - 20 mg/dL   Creatinine, Ser 1.70 (H) 0.61 - 1.24 mg/dL   Glucose, Bld 431 (H) 65 - 99 mg/dL   Calcium, Ion 1.13 1.13 - 1.30 mmol/L   TCO2 23 0 - 100 mmol/L   Hemoglobin 18.4 (H) 13.0 - 17.0 g/dL   HCT 54.0 (H) 39.0 - 52.0 %  I-Stat CG4 Lactic Acid, ED  Status: Abnormal   Collection Time: 10/24/15 10:29 AM  Result Value Ref Range   Lactic Acid, Venous 5.90 (HH) 0.5 -  2.0 mmol/L   Comment NOTIFIED PHYSICIAN   POC occult blood, ED RN will collect     Status: None   Collection Time: 10/24/15 11:43 AM  Result Value Ref Range   Fecal Occult Bld NEGATIVE NEGATIVE   Dg Chest Port 1 View  10/24/2015  CLINICAL DATA:  Abdominal pain and vomiting EXAM: PORTABLE CHEST 1 VIEW COMPARISON:  01/18/2009 FINDINGS: Normal heart size and mediastinal contours. Low volumes without acute infiltrate or edema. No effusion or pneumothorax. No acute osseous findings. Gas below the diaphragm appears intragastric. IMPRESSION: Negative low volume chest. Electronically Signed   By: Monte Fantasia M.D.   On: 10/24/2015 10:38       Assessment/Plan 1. Small bowel obstruction -The patient has had close to 3L, if not more, of total emesis and NGT output since arrival this morning.  His CT scan is c/w SBO.  Dr. Hulen Skains and I have reviewed his CT scan and we can not determine whether we think this is a straight forward bowel obstruction vs possible closed loop.  Contrast does not reach this area, which makes it quite difficult to determine.  Either way, he needs to be admitted for further medical management and may need surgical intervention.  He is still very underresuscitated.  He is -3L at least just from enteric contents.  He also had a Cr of 1.70 from dehydration on arrival, and is currently only on his 4th or 5th bolus.  He needs aggressive IVF resuscitation and this will also likely start correcting his lactic acid more as well.  This will need to be followed closely though.  We will initiate the SBO protocol as well.  If he is not better relatively quickly, he will likely require a laparotomy given he has a virgin abdomen.  No prior surgeries means he is at higher risk for not correcting this problem on his own with conservative medical management. 2. DM -per primary service 3. HTN -per primary service 4. ARI -likely secondary to dehydration.  Cont aggressive fluid hydration, per primary  service as well   Jaxxon Naeem E 10/24/2015, 12:57 PM Pager: [phone]

## 2015-10-24 NOTE — ED Notes (Signed)
[facility]-pt. Coming from urgent care because of hypotension. Pt. C/o epigastric pain starting this morning around 0530. Pt. Has thrown up twice with dark coffee ground emesis. Pt reports relief after vomiting. EMS arrival pt. Was diaphoretic and was given 324mg  ASA en route. Pt. Has not have had a BM since Tuesday. Pt. Traveled to [place] 45 days ago. Pt. CBG 352 en route. Pt. Has no taken metformin/or other meds yet this morning. Pt. AOx4.

## 2015-10-24 NOTE — Progress Notes (Signed)
ANTIBIOTIC CONSULT NOTE - INITIAL  Pharmacy Consult for zosyn Indication: intra-abdominal pain  No Known Allergies  Patient Measurements: Height: 5' (152.4 cm) Weight: 210 lb (95.255 kg) IBW/kg (Calculated) : 50 Adjusted Body Weight:   Vital Signs: Temp: 98.1 F (36.7 C) (12/08 0950) Temp Source: Oral (12/08 0950) BP: 145/94 mmHg (12/08 1445) Pulse Rate: 111 (12/08 1445) Intake/Output from previous day:   Intake/Output from this shift: Total I/O In: 4000 [I.V.:4000] Out: 2850 [Emesis/NG output:900; Other:1950]  Labs:  Recent Labs  10/24/15 1009 10/24/15 1028  WBC 13.0*  --   HGB 16.5 18.4*  PLT 320  --   CREATININE 1.98* 1.70*   Estimated Creatinine Clearance: 44.5 mL/min (by C-G formula based on Cr of 1.7). No results for input(s): VANCOTROUGH, VANCOPEAK, VANCORANDOM, GENTTROUGH, GENTPEAK, GENTRANDOM, TOBRATROUGH, TOBRAPEAK, TOBRARND, AMIKACINPEAK, AMIKACINTROU, AMIKACIN in the last 72 hours.   Microbiology: No results found for this or any previous visit (from the past 720 hour(s)).  Medical History: Past Medical History  Diagnosis Date  . Diabetes ([place])   . Hypertension   . Hyperlipidemia     Medications:  Anti-infectives    Start     Dose/Rate Route Frequency Ordered Stop   10/24/15 1700  piperacillin-tazobactam (ZOSYN) IVPB 3.375 g     3.375 g 12.5 mL/hr over 240 Minutes Intravenous Every 8 hours 10/24/15 1523     10/24/15 1530  piperacillin-tazobactam (ZOSYN) IVPB 3.375 g  Status:  Discontinued     3.375 g 100 mL/hr over 30 Minutes Intravenous  Once 10/24/15 1520 10/24/15 1522   10/24/15 1045  piperacillin-tazobactam (ZOSYN) IVPB 3.375 g     3.375 g 100 mL/hr over 30 Minutes Intravenous  Once 10/24/15 1039 10/24/15 1122     Assessment: 17 yom presented to the ED with hypotension and epigastric pain. Pt is afebrile and WBC is elevated at 13. Lactic acid is elevated at 5.78. Scr is elevated at 1.7. First dose ordered by EDP.  Zosyn  12/8>>  Goal of Therapy:  Eradication infection  Plan:  - Zosyn 3.375gm IV Q8H (4 hr inf) - F/u renal fxn, C&S, clinical status  Margi Edmundson, Rande Lawman 10/24/2015,3:25 PM

## 2015-10-24 NOTE — ED Notes (Signed)
Wifes phone number: [phone]

## 2015-10-24 NOTE — ED Notes (Signed)
Pt able to stand at bedside and urinate with assistance into urinal. Pt denies dizziness upon standing. Vitals remain the same.

## 2015-10-24 NOTE — ED Notes (Signed)
Radiology called because delay in Portable XR for NG placement and told pt. Was to be next to get xray.

## 2015-10-24 NOTE — H&P (Signed)
[facility] History and Physical                                                                                    Jeremiah Bowman, is a 59 y.o. male  MRN: [id number]   DOB - [birth year]  Admit Date - 10/24/2015  Outpatient Primary MD for the patient is unassigned. Patient states he goes to late [place] urgent care.  Referring Physician:  Dr. Regenia Skeeter  Chief Complaint:   Chief Complaint  Patient presents with  . Hypotension  . Emesis     HPI  Jeremiah Bowman  is a 59 y.o. male, with diabetes, hypertension and hyperlipidemia.  He presents the emergency department with severe abdominal pain and vomiting black liquid. He reports that his last bowel movement was approximately 2 days ago when he began to develop GERD symptoms. The symptoms became more severe, his abdomen became distended and this morning he developed severe abdominal pain and vomiting. He has not eaten or had much to drink in the past 2 days. He has urinated very little in the past 2 days. He denies any previous history of bowel obstruction, or abdominal surgery. He has no family history of GI cancers or bowel obstruction.  He does not smoke tobacco. He states that he takes 2 shots of brandy each evening and will feel "antsy" if he doesn't get his brandy.  In the emergency department he is found to have severe sepsis with a pulse rate of 136, respirations 34, white count 13, lactic acid 5.9. His glucose is in the 400s. CT scan of his abdomen pelvis shows a small bowel obstruction with a transition point in the distal jejunum.  Review of Systems  Constitutional: Positive for malaise/fatigue.  HENT: Negative.   Eyes: Negative.   Respiratory: Negative.   Cardiovascular: Positive for chest pain. Negative for claudication, leg swelling and PND.  Gastrointestinal: Positive for heartburn, nausea, vomiting, abdominal pain and constipation.  Genitourinary: Positive for dysuria.  Musculoskeletal: Negative.   Skin: Negative.    Neurological: Negative.   Endo/Heme/Allergies: Negative.   Psychiatric/Behavioral: Negative.      Past Medical History  Past Medical History  Diagnosis Date  . Diabetes ([place])   . Hypertension   . Hyperlipidemia     Past Surgical History  Procedure Laterality Date  . Tonsillectomy    . Wrist surgery        Social History Social History  Substance Use Topics  . Smoking status: Former Research scientist (life sciences)  . Smokeless tobacco: Not on file  . Alcohol Use: 9.6 oz/week    2 Cans of beer, 14 Shots of liquor per week   lives at home with his wife. Independent with ADLs. Manages a Psychologist, occupational.  Family History He knows of no GI cancers or bowel obstructions in his family.  Prior to Admission medications   Medication Sig Start Date End Date Taking? Authorizing Provider  aspirin 81 MG chewable tablet Chew 81 mg by mouth daily.   Yes Historical Provider, MD  lisinopril-hydrochlorothiazide (PRINZIDE,ZESTORETIC) 20-12.5 MG tablet Take 1 tablet by mouth daily.   Yes Historical Provider, MD  metFORMIN (GLUCOPHAGE) 1000 MG tablet Take 1,000 mg by  mouth 2 (two) times daily with a meal.   Yes Historical Provider, MD  pravastatin (PRAVACHOL) 20 MG tablet Take 20 mg by mouth daily.   Yes Historical Provider, MD  verapamil (CALAN-SR) 180 MG CR tablet Take 180 mg by mouth daily.   Yes Historical Provider, MD    No Known Allergies  Physical Exam  Vitals  Blood pressure 145/94, pulse 111, temperature 98.1 F (36.7 C), temperature source Oral, resp. rate 25, height 5' (1.524 m), weight 95.255 kg (210 lb), SpO2 95 %.   General: Very pleasant, obese male lying in bed in NAD, NG tube in place with black liquid coming out.  Psych:  Normal affect and insight, Not Suicidal or Homicidal, Awake Alert, Oriented X 3.  Neuro:   No F.N deficits, ALL C.Nerves Intact, Strength 5/5 all 4 extremities, Sensation intact all 4 extremities.  ENT:  Ears and Eyes appear Normal, Conjunctivae clear, PER. Moist  oral mucosa without erythema or exudates.  Neck:  Supple, No lymphadenopathy appreciated  Respiratory:  Symmetrical chest wall movement, Good air movement bilaterally, CTAB.  Cardiac:  Tachycardic, No Murmurs, no LE edema noted, no JVD.    Abdomen:  Distended, positive normal sounding bowel sounds, nontender, unable to assess for masses due to distention.  Skin:  No Cyanosis, Normal Skin Turgor, No Skin Rash or Bruise.  Extremities:  Able to move all 4. 5/5 strength in each,  no effusions.  Data Review  Wt Readings from Last 3 Encounters:  10/24/15 95.255 kg (210 lb)    CBC  Recent Labs Lab 10/24/15 1009 10/24/15 1028  WBC 13.0*  --   HGB 16.5 18.4*  HCT 48.0 54.0*  PLT 320  --   MCV 86.8  --   MCH 29.8  --   MCHC 34.4  --   RDW 13.4  --   LYMPHSABS 0.8  --   MONOABS 1.4*  --   EOSABS 0.0  --   BASOSABS 0.0  --     Chemistries   Recent Labs Lab 10/24/15 1009 10/24/15 1028  NA 137 137  K 4.1 3.8  CL 97* 98*  CO2 21*  --   GLUCOSE 413* 431*  BUN 27* 31*  CREATININE 1.98* 1.70*  CALCIUM 11.0*  --   AST 31  --   ALT 34  --   ALKPHOS 75  --   BILITOT 1.7*  --        Lab Results  Component Value Date   HGBA1C  01/18/2009    6.0 (NOTE)   The ADA recommends the following therapeutic goal for glycemic   control related to Hgb A1C measurement:   Goal of Therapy:   < 7.0% Hgb A1C   Reference: American Diabetes Association: Clinical Practice   Recommendations 2008, Diabetes Care,  2008, 31:(Suppl 1).    CREATININE: 1.7 mg/dL ABNORMAL (10/24/15 1028) Estimated creatinine clearance - 44.5 mL/min    Coagulation profile  Recent Labs Lab 10/24/15 1009  INR 1.19     Imaging results:   Ct Abdomen Pelvis Wo Contrast  10/24/2015  CLINICAL DATA:  Epigastric pain starting this morning. Coffee ground emesis. Diaphoresis. EXAM: CT ABDOMEN AND PELVIS WITHOUT CONTRAST TECHNIQUE: Multidetector CT imaging of the abdomen and pelvis was performed following the  standard protocol without IV contrast. COMPARISON:  None. FINDINGS: Lower chest: Lung bases show subpleural nodules measuring up to 7 mm in the lateral segment right middle lobe (series 3, image 4). Minimal subsegmental atelectasis is seen  dependently in both lower lobes. Heart size normal. No pericardial or pleural effusion. Nasogastric tube is seen in the distal esophagus. Hepatobiliary: Liver appears slightly decreased in attenuation diffusely. Liver and gallbladder are otherwise unremarkable. No biliary ductal dilatation. Pancreas: Negative. Spleen: Negative. Adrenals/Urinary Tract: Right adrenal gland is unremarkable. Thickening of the left adrenal gland. Kidneys and ureters are unremarkable. Bladder is grossly unremarkable. Stomach/Bowel: Stomach is distended with oral contrast. Duodenum is decompressed. Jejunum is dilated with contrast seen only in the proximal jejunum. There is decompression of small bowel in the left lower quadrant. A discrete transition point is difficult to see due to lack of oral contrast in this area. Remainder of the small bowel is markedly decompressed, as is the colon. Vascular/Lymphatic: Vascular structures are unremarkable. No pathologically enlarged lymph nodes. Reproductive: Prostate is at the upper limits of normal in size. Other: Small amount of fluid is seen in the anterior left anatomic pelvis (series 2, image 71). Scattered haziness in the right-sided omentum (series 2, images 41 and 43), nonspecific. No nodularity. No free air. Musculoskeletal: No worrisome lytic or sclerotic lesions. IMPRESSION: 1. Small bowel obstruction with transition point likely in the distal jejunum. Please see discussion above. 2. Trace ascites. 3. Liver appears fatty. 4. Mild haziness in the right sided omentum, nonspecific. 5. Subpleural nodules in the lung bases, likely subpleural lymph nodes. If the patient is at high risk for bronchogenic carcinoma, follow-up chest CT at 3-77months is  recommended. If the patient is at low risk for bronchogenic carcinoma, follow-up chest CT at 6-12 months is recommended. This recommendation follows the consensus statement: Guidelines for Management of Small Pulmonary Nodules Detected on CT Scans: A Statement from the [employer] as published in Radiology 2005; 237:395-400. Electronically Signed   By: Lorin Picket M.D.   On: 10/24/2015 14:18   Dg Chest Port 1 View  10/24/2015  CLINICAL DATA:  Abdominal pain and vomiting EXAM: PORTABLE CHEST 1 VIEW COMPARISON:  01/18/2009 FINDINGS: Normal heart size and mediastinal contours. Low volumes without acute infiltrate or edema. No effusion or pneumothorax. No acute osseous findings. Gas below the diaphragm appears intragastric. IMPRESSION: Negative low volume chest. Electronically Signed   By: Monte Fantasia M.D.   On: 10/24/2015 10:38    My personal review of EKG: Sinus tach, QTC is 418.   Assessment & Plan  Principal Problem:   Severe sepsis ([place]) Active Problems:   Abdominal pain   Nausea and vomiting   Small bowel obstruction ([facility])   Alcohol dependency ([place])   Diabetes mellitus ([place])   HTN (hypertension)   Hyperlipidemia   Acute urinary retention   Acute renal failure (ARF) ([facility])   Severe sepsis Given white count 13, lactic acid of 5+, pulse rate and respirations. As well as acute renal failure. Patient has received 5 L of IV fluids in the emergency department and will continue to receive fluid at 150 mL an hour Blood cultures 2 are being obtained and he has been placed on Zosyn per pharmacy.  We will continue to follow his lactic acid.   Small bowel obstruction Gen. surgery has been consulted. They have initiated "SBO protocol". Per their note if he is not better relatively quickly he will likely require a laparotomy. NG tube is in place. Nothing by mouth.   Acute renal failure Secondary to severe sepsis and dehydration and likely acute urinary retention. Foley is being  placed. Strict I's & Os.  Continue IV hydration. Lisinopril and metformin are currently being held.  Diabetes mellitus Hold metformin. Start sliding scale sensitive with every 4 CBGs.   Alcohol dependency CIWA protocol ordered.  Patient states he gets "Antsy" if he does not have his brandy each evening.     Hypertension Holding lisinopril in the setting of ARF and sepsis.  Holding verapamil will order metoprolol IV PRN.   Consultants Called:    Surgery  Family Communication:     Patient is alert, orientated and understands their plan of care.  Code Status:    Full code  Condition:    Very guarded.  Potential Disposition:   To be determined.  Will likely need some type of rehab  eventually.  Time spent in minutes : [place],  [place] on 10/24/2015 at 3:41 PM Between 7am to 7pm - Pager - [phone] After 7pm go to [email or web address] - password [email or web address] And look for the night coverage person covering me after hours

## 2015-10-25 ENCOUNTER — Encounter (HOSPITAL_COMMUNITY): Payer: Self-pay | Admitting: General Practice

## 2015-10-25 ENCOUNTER — Inpatient Hospital Stay (HOSPITAL_COMMUNITY): Payer: Self-pay

## 2015-10-25 DIAGNOSIS — K56609 Unspecified intestinal obstruction, unspecified as to partial versus complete obstruction: Secondary | ICD-10-CM | POA: Insufficient documentation

## 2015-10-25 DIAGNOSIS — K5669 Other intestinal obstruction: Secondary | ICD-10-CM

## 2015-10-25 DIAGNOSIS — F1020 Alcohol dependence, uncomplicated: Secondary | ICD-10-CM

## 2015-10-25 DIAGNOSIS — E872 Acidosis: Secondary | ICD-10-CM

## 2015-10-25 DIAGNOSIS — N179 Acute kidney failure, unspecified: Secondary | ICD-10-CM

## 2015-10-25 DIAGNOSIS — E785 Hyperlipidemia, unspecified: Secondary | ICD-10-CM

## 2015-10-25 LAB — CBC
HEMATOCRIT: 40.3 % (ref 39.0–52.0)
HEMOGLOBIN: 13.5 g/dL (ref 13.0–17.0)
MCH: 29.2 pg (ref 26.0–34.0)
MCHC: 33.5 g/dL (ref 30.0–36.0)
MCV: 87.2 fL (ref 78.0–100.0)
Platelets: 242 10*3/uL (ref 150–400)
RBC: 4.62 MIL/uL (ref 4.22–5.81)
RDW: 13.6 % (ref 11.5–15.5)
WBC: 7.2 10*3/uL (ref 4.0–10.5)

## 2015-10-25 LAB — COMPREHENSIVE METABOLIC PANEL
ALT: 25 U/L (ref 17–63)
ANION GAP: 12 (ref 5–15)
AST: 22 U/L (ref 15–41)
Albumin: 3.7 g/dL (ref 3.5–5.0)
Alkaline Phosphatase: 50 U/L (ref 38–126)
BUN: 20 mg/dL (ref 6–20)
CHLORIDE: 108 mmol/L (ref 101–111)
CO2: 24 mmol/L (ref 22–32)
CREATININE: 1.11 mg/dL (ref 0.61–1.24)
Calcium: 9.4 mg/dL (ref 8.9–10.3)
GFR calc non Af Amer: >60 mL/min (ref >60)
Glucose, Bld: 202 mg/dL — ABNORMAL HIGH (ref 65–99)
POTASSIUM: 3.5 mmol/L (ref 3.5–5.1)
SODIUM: 144 mmol/L (ref 135–145)
Total Bilirubin: 1.1 mg/dL (ref 0.3–1.2)
Total Protein: 7 g/dL (ref 6.5–8.1)

## 2015-10-25 LAB — GLUCOSE, CAPILLARY
GLUCOSE-CAPILLARY: 151 mg/dL — AB (ref 65–99)
GLUCOSE-CAPILLARY: 170 mg/dL — AB (ref 65–99)
GLUCOSE-CAPILLARY: 193 mg/dL — AB (ref 65–99)
Glucose-Capillary: 199 mg/dL — ABNORMAL HIGH (ref 65–99)
Glucose-Capillary: 123 mg/dL — ABNORMAL HIGH (ref 65–99)
Glucose-Capillary: 130 mg/dL — ABNORMAL HIGH (ref 65–99)

## 2015-10-25 LAB — HEMOGLOBIN A1C
HEMOGLOBIN A1C: 7.4 % — AB (ref 4.8–5.6)
MEAN PLASMA GLUCOSE: 166 mg/dL

## 2015-10-25 MED ORDER — SODIUM CHLORIDE 0.9 % IV SOLN
12.5000 mg | Freq: Once | INTRAVENOUS | Status: AC
  Administered 2015-10-25: 12.5 mg via INTRAVENOUS
  Filled 2015-10-25: qty 0.5

## 2015-10-25 MED ORDER — CETYLPYRIDINIUM CHLORIDE 0.05 % MT LIQD
7.0000 mL | Freq: Two times a day (BID) | OROMUCOSAL | Status: DC
  Administered 2015-10-25 – 2015-10-26 (×4): 7 mL via OROMUCOSAL

## 2015-10-25 MED ORDER — METOPROLOL TARTRATE 1 MG/ML IV SOLN
2.5000 mg | Freq: Four times a day (QID) | INTRAVENOUS | Status: DC
  Administered 2015-10-25 – 2015-10-27 (×9): 2.5 mg via INTRAVENOUS
  Filled 2015-10-25 (×8): qty 5

## 2015-10-25 MED ORDER — PNEUMOCOCCAL VAC POLYVALENT 25 MCG/0.5ML IJ INJ
0.5000 mL | INJECTION | INTRAMUSCULAR | Status: AC
  Administered 2015-10-26: 0.5 mL via INTRAMUSCULAR
  Filled 2015-10-25: qty 0.5

## 2015-10-25 NOTE — Progress Notes (Signed)
PROGRESS NOTE  Jeremiah Bowman [id number] DOB: 1956-07-01 DOA: 10/24/2015 PCP: No primary care provider on file.    Brief History 59 year old male with a history of diabetes mellitus, hypertension, hyperlipidemia presented with 2 day history of worsening abdominal pain and distention. The patient also developed nausea and vomiting on the day prior to admission. Workup in the emergency department revealed tachycardia with lactic acid 5.9 CT abdomen and pelvis revealed small bowel obstruction with a transition point in the distal jejunum. Gen. surgery was consulted.NG tube was placed to suction with improvement of the patient's abdominal symptoms. The patient denies any previous history of a bowel obstruction or intra-abdominal surgeries.  Assessment/Plan: Small bowel obstruction  -Appreciate general surgery follow-up  -Continue NG decompression  -Remain nothing by mouth  -Transition pertinent medications to IV  -Continue IV fluids  -10/24/2015 CT abdomen and pelvis--small bowel obstruction with transition point in the distal jejunum  Lactic acidosis  -Secondary to volume depletion  -Improving with intravenous fluid resuscitation  Leukocytosis  -Likely stress demargination  -Continue Zosyn pending culture data  Acute kidney injury  -Secondary to volume depletion  -Improving with fluid resuscitation  Diabetes mellitus type 2  -Hemoglobin A1c  -Discontinue metformin  -NovoLog sliding scale  Hypertension  -Hold lisinopril in the setting of acute kidney injury  -Hold verapamil due to npo status  -Intravenous Lopressor while the patient remains npo -Discontinue HCTZ  Alcohol dependence  -CIWA protocol -pt has 2 shots brandy daily x 10 yrs  Family Communication:   Pt at beside Disposition Plan:   Transfer to tele        Procedures/Studies: Ct Abdomen Pelvis Wo Contrast  10/24/2015  CLINICAL DATA:  Epigastric pain starting this morning. Coffee ground emesis.  Diaphoresis. EXAM: CT ABDOMEN AND PELVIS WITHOUT CONTRAST TECHNIQUE: Multidetector CT imaging of the abdomen and pelvis was performed following the standard protocol without IV contrast. COMPARISON:  None. FINDINGS: Lower chest: Lung bases show subpleural nodules measuring up to 7 mm in the lateral segment right middle lobe (series 3, image 4). Minimal subsegmental atelectasis is seen dependently in both lower lobes. Heart size normal. No pericardial or pleural effusion. Nasogastric tube is seen in the distal esophagus. Hepatobiliary: Liver appears slightly decreased in attenuation diffusely. Liver and gallbladder are otherwise unremarkable. No biliary ductal dilatation. Pancreas: Negative. Spleen: Negative. Adrenals/Urinary Tract: Right adrenal gland is unremarkable. Thickening of the left adrenal gland. Kidneys and ureters are unremarkable. Bladder is grossly unremarkable. Stomach/Bowel: Stomach is distended with oral contrast. Duodenum is decompressed. Jejunum is dilated with contrast seen only in the proximal jejunum. There is decompression of small bowel in the left lower quadrant. A discrete transition point is difficult to see due to lack of oral contrast in this area. Remainder of the small bowel is markedly decompressed, as is the colon. Vascular/Lymphatic: Vascular structures are unremarkable. No pathologically enlarged lymph nodes. Reproductive: Prostate is at the upper limits of normal in size. Other: Small amount of fluid is seen in the anterior left anatomic pelvis (series 2, image 71). Scattered haziness in the right-sided omentum (series 2, images 41 and 43), nonspecific. No nodularity. No free air. Musculoskeletal: No worrisome lytic or sclerotic lesions. IMPRESSION: 1. Small bowel obstruction with transition point likely in the distal jejunum. Please see discussion above. 2. Trace ascites. 3. Liver appears fatty. 4. Mild haziness in the right sided omentum, nonspecific. 5. Subpleural nodules in the  lung bases, likely subpleural lymph nodes. If  the patient is at high risk for bronchogenic carcinoma, follow-up chest CT at 3-101months is recommended. If the patient is at low risk for bronchogenic carcinoma, follow-up chest CT at 6-12 months is recommended. This recommendation follows the consensus statement: Guidelines for Management of Small Pulmonary Nodules Detected on CT Scans: A Statement from the [place] as published in Radiology 2005; 237:395-400. Electronically Signed   By: Lorin Picket M.D.   On: 10/24/2015 14:18   Dg Chest Port 1 View  10/24/2015  CLINICAL DATA:  Abdominal pain and vomiting EXAM: PORTABLE CHEST 1 VIEW COMPARISON:  01/18/2009 FINDINGS: Normal heart size and mediastinal contours. Low volumes without acute infiltrate or edema. No effusion or pneumothorax. No acute osseous findings. Gas below the diaphragm appears intragastric. IMPRESSION: Negative low volume chest. Electronically Signed   By: Monte Fantasia M.D.   On: 10/24/2015 10:38   Dg Abd Portable 1v  10/25/2015  CLINICAL DATA:  Nasogastric tube placement.  Initial encounter. EXAM: PORTABLE ABDOMEN - 1 VIEW COMPARISON:  Abdominal radiograph performed earlier today at 12:39 a.m. FINDINGS: The patient's enteric tube is noted ending overlying the antrum of the stomach. The stomach is largely filled with dense material. The visualized bowel gas pattern is unremarkable. Scattered air and stool filled loops of colon are seen; no abnormal dilatation of small bowel loops is seen to suggest small bowel obstruction. No free intra-abdominal air is identified, though evaluation for free air is limited on a single supine view. The visualized osseous structures are within normal limits; the sacroiliac joints are unremarkable in appearance. IMPRESSION: Enteric tube noted ending overlying the antrum of the stomach. The stomach is largely filled with dense material. Electronically Signed   By: Garald Balding M.D.   On: 10/25/2015  02:49   Dg Abd Portable 1v-small Bowel Obstruction Protocol-initial, 8 Hr Delay  10/25/2015  CLINICAL DATA:  59 year old male with small bowel obstruction EXAM: PORTABLE ABDOMEN - 1 VIEW COMPARISON:  CT dated 10/24/2015 FINDINGS: Single portable view of the abdomen demonstrates severe distention of the stomach with oral contrast. Dilated loops of small bowel noted in the mid abdomen measuring up to 4 cm in diameter. No contrast identified within the colon. There is degenerative changes of the lower lumbar spine. No acute fracture. IMPRESSION: Severe distention of the stomach with dilated loops of small bowel in the mid abdomen. Consider placement of an enteric tube for decompression of the stomach. These results were called by telephone at the time of interpretation on 10/25/2015 at 1:16 am to nurse Marlowe Sax who verbally acknowledged these results. Electronically Signed   By: Anner Crete M.D.   On: 10/25/2015 01:17        Subjective:  patient still has some dominant pain but much improved. He is passing flatus but no bowel movement. Denies any fevers, chills, chest pain, shortness breath, dysuria, hematuria. No hematochezia or melena. Denies any headache or neck pain. No rashes. No arthralgias.   Objective: Filed Vitals:   10/25/15 0405 10/25/15 0500 10/25/15 0600 10/25/15 0800  BP: 141/104 133/96 142/100 136/96  Pulse: 128 96 105 122  Temp: 98.8 F (37.1 C)   98.1 F (36.7 C)  TempSrc: Oral   Oral  Resp: 23 18 24 26   Height:      Weight:   95 kg (209 lb 7 oz)   SpO2: 93% 96% 94% 94%    Intake/Output Summary (Last 24 hours) at 10/25/15 0942 Last data filed at 10/25/15 0235  Gross per 24 hour  Intake 4592.5 ml  Output   4150 ml  Net  442.5 ml   Weight change:  Exam:   General:  Pt is alert, follows commands appropriately, not in acute distress  HEENT: No icterus, No thrush, No neck mass, [place]/AT  Cardiovascular: RRR, S1/S2, no rubs, no gallops  Respiratory: bibasilar rales  without any wheezing. Good air movement  Abdomen: Soft/+BS, non distended, no guarding; mild lower quadrant tenderness without any rebound   Extremities: trace LE edema, No lymphangitis, No petechiae, No rashes, no synovitis  Data Reviewed: Basic Metabolic Panel:  Recent Labs Lab 10/24/15 1009 10/24/15 1028 10/24/15 1933 10/25/15 0313  NA 137 137 144 144  K 4.1 3.8 3.7 3.5  CL 97* 98* 109 108  CO2 21*  --  27 24  GLUCOSE 413* 431* 200* 202*  BUN 27* 31* 20 20  CREATININE 1.98* 1.70* 1.13 1.11  CALCIUM 11.0*  --  9.4 9.4   Liver Function Tests:  Recent Labs Lab 10/24/15 1009 10/25/15 0313  AST 31 22  ALT 34 25  ALKPHOS 75 50  BILITOT 1.7* 1.1  PROT 8.8* 7.0  ALBUMIN 4.3 3.7    Recent Labs Lab 10/24/15 1009  LIPASE 26   No results for input(s): AMMONIA in the last 168 hours. CBC:  Recent Labs Lab 10/24/15 1009 10/24/15 1028 10/24/15 1645 10/25/15 0313  WBC 13.0*  --  8.9 7.2  NEUTROABS 10.8*  --  6.4  --   HGB 16.5 18.4* 13.6 13.5  HCT 48.0 54.0* 39.2 40.3  MCV 86.8  --  86.0 87.2  PLT 320  --  230 242   Cardiac Enzymes: No results for input(s): CKTOTAL, CKMB, CKMBINDEX, TROPONINI in the last 168 hours. BNP: Invalid input(s): POCBNP CBG:  Recent Labs Lab 10/24/15 1602 10/24/15 2035 10/25/15 0020 10/25/15 0411 10/25/15 0835  GLUCAP 206* 160* 170* 199* 193*    Recent Results (from the past 240 hour(s))  MRSA PCR Screening     Status: None   Collection Time: 10/24/15  6:15 PM  Result Value Ref Range Status   MRSA by PCR NEGATIVE NEGATIVE Final    Comment:        The GeneXpert MRSA Assay (FDA approved for NASAL specimens only), is one component of a comprehensive MRSA colonization surveillance program. It is not intended to diagnose MRSA infection nor to guide or monitor treatment for MRSA infections.      Scheduled Meds: . heparin  5,000 Units Subcutaneous 3 times per day  . insulin aspart  0-9 Units Subcutaneous 6 times per  day  . metoprolol  2.5 mg Intravenous 4 times per day  . piperacillin-tazobactam (ZOSYN)  IV  3.375 g Intravenous Q8H  . thiamine  100 mg Oral Daily   Or  . thiamine  100 mg Intravenous Daily   Continuous Infusions: . sodium chloride 150 mL/hr at 10/24/15 1835     Jacinta Penalver, DO  [facility] Pager [phone]  If 7PM-7AM, please contact night-coverage [email or web address] Password [email or web address] 10/25/2015, 9:42 AM   LOS: 1 day

## 2015-10-25 NOTE — Progress Notes (Signed)
0055Called to patient room by patient.Patient stated," I was having hiccups and the tube in my nose just fell out." Call placed to NP Kathline Magic. NP Rogue Bussing wants NGT replaced and order received for medication for hiccups.

## 2015-10-25 NOTE — Evaluation (Signed)
Physical Therapy Evaluation Patient Details Name: Jeremiah Bowman MRN: [id number] DOB: 1956-01-19 Today's Date: 10/25/2015   History of Present Illness  59 year old male with a history of diabetes mellitus, hypertension, hyperlipidemia presented with 2 day history of worsening abdominal pain and distention. The patient also developed nausea and vomiting on the day prior to admission. Workup in the emergency department revealed tachycardia with lactic acid 5.9 CT abdomen and pelvis revealed small bowel obstruction with a transition point in the distal jejunum. Gen. surgery was consulted.NG tube was placed to suction with improvement of the patient's abdominal symptoms. The patient denies any previous history of a bowel obstruction or intra-abdominal surgeries  Clinical Impression  Pt admitted with above diagnosis. Pt currently with functional limitations due to the deficits listed below (see PT Problem List). Limited eval as pt on bedrest due to abdominal issues.  Will follow acutely.  At bed level, pt encouraged to perform UE and LE exercises to maintain strength.   Pt will benefit from skilled PT to increase their independence and safety with mobility to allow discharge to the venue listed below.      Follow Up Recommendations Home health PT;Supervision/Assistance - 24 hour    Equipment Recommendations  Other (comment) (TBA)    Recommendations for Other Services       Precautions / Restrictions Precautions Precautions: Fall Restrictions Weight Bearing Restrictions: No      Mobility  Bed Mobility Overal bed mobility: Independent             General bed mobility comments: Nursing stated to perform exercises only as pt on bedrest.  Pt moved up in bed without need for assist however once pt did so he was slightly nauseated with just that little movement.   Transfers                    Ambulation/Gait                Stairs            Wheelchair Mobility     Modified Rankin (Stroke Patients Only)       Balance                                             Pertinent Vitals/Pain Pain Assessment: No/denies pain  106 bpm, 93% RA, 136/96    Home Living Family/patient expects to be discharged to:: Private residence Living Arrangements: Spouse/significant other Available Help at Discharge: Family;Available 24 hours/day (wife and daughter) Type of Home: House Home Access: Stairs to enter Entrance Stairs-Rails: Left Entrance Stairs-Number of Steps: 3 Home Layout: Two level;Able to live on main level with bedroom/bathroom Home Equipment: None Additional Comments: Pt owns his own business.     Prior Function Level of Independence: Independent               Hand Dominance        Extremity/Trunk Assessment   Upper Extremity Assessment: Defer to OT evaluation           Lower Extremity Assessment: Overall WFL for tasks assessed      Cervical / Trunk Assessment: Normal  Communication   Communication: No difficulties  Cognition Arousal/Alertness: Awake/alert Behavior During Therapy: WFL for tasks assessed/performed Overall Cognitive Status: Within Functional Limits for tasks assessed  General Comments      Exercises General Exercises - Upper Extremity Shoulder Flexion: AROM;Both;10 reps;Supine Shoulder Extension: AROM;Both;10 reps;Supine Shoulder Horizontal ADduction: AROM;Both;10 reps;Supine General Exercises - Lower Extremity Ankle Circles/Pumps: AROM;Both;10 reps;Supine Quad Sets: AROM;Both;10 reps;Supine Heel Slides: AROM;Both;10 reps;Supine Hip ABduction/ADduction: AROM;Both;10 reps;Supine Straight Leg Raises: AROM;Both;10 reps;Supine      Assessment/Plan    PT Assessment Patient needs continued PT services  PT Diagnosis Generalized weakness   PT Problem List Decreased activity tolerance;Decreased balance;Decreased mobility;Decreased knowledge of use of  DME;Decreased safety awareness;Decreased knowledge of precautions  PT Treatment Interventions DME instruction;Gait training;Functional mobility training;Therapeutic activities;Therapeutic exercise;Balance training;Patient/family education   PT Goals (Current goals can be found in the Care Plan section) Acute Rehab PT Goals Patient Stated Goal: to get better PT Goal Formulation: With patient Time For Goal Achievement: 11/08/15 Potential to Achieve Goals: Good    Frequency Min 3X/week   Barriers to discharge        Co-evaluation               End of Session   Activity Tolerance: Patient limited by fatigue (limited by bedrest) Patient left: in bed;with call bell/phone within reach Nurse Communication: Mobility status         Time: 0911-0922 PT Time Calculation (min) (ACUTE ONLY): 11 min   Charges:   PT Evaluation $Initial PT Evaluation Tier I: 1 Procedure     PT G CodesDenice Paradise 2015/11/18, 10:26 AM [facility] [phone] (pager)

## 2015-10-25 NOTE — Progress Notes (Signed)
Utilization review completed.  

## 2015-10-25 NOTE — Clinical Documentation Improvement (Signed)
Hospitalist  Abnormal Lab/Test Results:  Blood sugars on admission was 431, 413, 206  Possible Clinical Conditions associated with below indicators  Diabetes uncontrolled  Other Condition  Cannot Clinically Determine   Supporting Information: Pt is diabetic and high blood sugars acknowledged in record  Treatment Provided: Changed to sliding scale.   Please exercise your independent, professional judgment when responding. A specific answer is not anticipated or expected.   Thank You,  [place] [phone]

## 2015-10-25 NOTE — Progress Notes (Signed)
Patient ID: Jeremiah Bowman, male   DOB: 1956-05-16, 59 y.o.   MRN: [id number]    Subjective: Pt having more pain this morning.  Some flatus, but no stool.  NGT came out overnight and replaced.  Wall mount does not register numbers so no one knew what the suction was set to all night.  When manipulated over 3L of gastric contents were removed and patient's pain dramatically improved and he feels much better!  Objective: Vital signs in last 24 hours: Temp:  [98.1 F (36.7 C)-100 F (37.8 C)] 98.8 F (37.1 C) (12/09 0405) Pulse Rate:  [96-136] 105 (12/09 0600) Resp:  [13-34] 24 (12/09 0600) BP: (110-160)/(76-108) 142/100 mmHg (12/09 0600) SpO2:  [90 %-97 %] 94 % (12/09 0600) Weight:  [95 kg (209 lb 7 oz)-95.4 kg (210 lb 5.1 oz)] 95 kg (209 lb 7 oz) (12/09 0600) Last BM Date: 10/22/15  Intake/Output from previous day: 12/08 0701 - 12/09 0700 In: 4592.5 [I.V.:4492.5; IV Piggyback:100] Out: 4150 [Urine:1000; Emesis/NG output:1200] Intake/Output this shift:    PE: Abd: initially distended, but after decompression it is much softer, few BS, NGT with brown bilious gastric output, less tender, but still mildly tender Heart: tachy, but regular  Lab Results:   Recent Labs  10/24/15 1645 10/25/15 0313  WBC 8.9 7.2  HGB 13.6 13.5  HCT 39.2 40.3  PLT 230 242   BMET  Recent Labs  10/24/15 1933 10/25/15 0313  NA 144 144  K 3.7 3.5  CL 109 108  CO2 27 24  GLUCOSE 200* 202*  BUN 20 20  CREATININE 1.13 1.11  CALCIUM 9.4 9.4   PT/INR  Recent Labs  10/24/15 1009 10/24/15 1712  LABPROT 15.2 15.0  INR 1.19 1.16   CMP     Component Value Date/Time   NA 144 10/25/2015 0313   K 3.5 10/25/2015 0313   CL 108 10/25/2015 0313   CO2 24 10/25/2015 0313   GLUCOSE 202* 10/25/2015 0313   BUN 20 10/25/2015 0313   CREATININE 1.11 10/25/2015 0313   CALCIUM 9.4 10/25/2015 0313   PROT 7.0 10/25/2015 0313   ALBUMIN 3.7 10/25/2015 0313   AST 22 10/25/2015 0313   ALT 25 10/25/2015 0313    ALKPHOS 50 10/25/2015 0313   BILITOT 1.1 10/25/2015 0313   GFRNONAA >60 10/25/2015 0313   GFRAA >60 10/25/2015 0313   Lipase     Component Value Date/Time   LIPASE 26 10/24/2015 1009       Studies/Results: Ct Abdomen Pelvis Wo Contrast  10/24/2015  CLINICAL DATA:  Epigastric pain starting this morning. Coffee ground emesis. Diaphoresis. EXAM: CT ABDOMEN AND PELVIS WITHOUT CONTRAST TECHNIQUE: Multidetector CT imaging of the abdomen and pelvis was performed following the standard protocol without IV contrast. COMPARISON:  None. FINDINGS: Lower chest: Lung bases show subpleural nodules measuring up to 7 mm in the lateral segment right middle lobe (series 3, image 4). Minimal subsegmental atelectasis is seen dependently in both lower lobes. Heart size normal. No pericardial or pleural effusion. Nasogastric tube is seen in the distal esophagus. Hepatobiliary: Liver appears slightly decreased in attenuation diffusely. Liver and gallbladder are otherwise unremarkable. No biliary ductal dilatation. Pancreas: Negative. Spleen: Negative. Adrenals/Urinary Tract: Right adrenal gland is unremarkable. Thickening of the left adrenal gland. Kidneys and ureters are unremarkable. Bladder is grossly unremarkable. Stomach/Bowel: Stomach is distended with oral contrast. Duodenum is decompressed. Jejunum is dilated with contrast seen only in the proximal jejunum. There is decompression of small bowel in the left  lower quadrant. A discrete transition point is difficult to see due to lack of oral contrast in this area. Remainder of the small bowel is markedly decompressed, as is the colon. Vascular/Lymphatic: Vascular structures are unremarkable. No pathologically enlarged lymph nodes. Reproductive: Prostate is at the upper limits of normal in size. Other: Small amount of fluid is seen in the anterior left anatomic pelvis (series 2, image 71). Scattered haziness in the right-sided omentum (series 2, images 41 and 43),  nonspecific. No nodularity. No free air. Musculoskeletal: No worrisome lytic or sclerotic lesions. IMPRESSION: 1. Small bowel obstruction with transition point likely in the distal jejunum. Please see discussion above. 2. Trace ascites. 3. Liver appears fatty. 4. Mild haziness in the right sided omentum, nonspecific. 5. Subpleural nodules in the lung bases, likely subpleural lymph nodes. If the patient is at high risk for bronchogenic carcinoma, follow-up chest CT at 3-24months is recommended. If the patient is at low risk for bronchogenic carcinoma, follow-up chest CT at 6-12 months is recommended. This recommendation follows the consensus statement: Guidelines for Management of Small Pulmonary Nodules Detected on CT Scans: A Statement from the [place] as published in Radiology 2005; 237:395-400. Electronically Signed   By: Lorin Picket M.D.   On: 10/24/2015 14:18   Dg Chest Port 1 View  10/24/2015  CLINICAL DATA:  Abdominal pain and vomiting EXAM: PORTABLE CHEST 1 VIEW COMPARISON:  01/18/2009 FINDINGS: Normal heart size and mediastinal contours. Low volumes without acute infiltrate or edema. No effusion or pneumothorax. No acute osseous findings. Gas below the diaphragm appears intragastric. IMPRESSION: Negative low volume chest. Electronically Signed   By: Monte Fantasia M.D.   On: 10/24/2015 10:38   Dg Abd Portable 1v  10/25/2015  CLINICAL DATA:  Nasogastric tube placement.  Initial encounter. EXAM: PORTABLE ABDOMEN - 1 VIEW COMPARISON:  Abdominal radiograph performed earlier today at 12:39 a.m. FINDINGS: The patient's enteric tube is noted ending overlying the antrum of the stomach. The stomach is largely filled with dense material. The visualized bowel gas pattern is unremarkable. Scattered air and stool filled loops of colon are seen; no abnormal dilatation of small bowel loops is seen to suggest small bowel obstruction. No free intra-abdominal air is identified, though evaluation for free  air is limited on a single supine view. The visualized osseous structures are within normal limits; the sacroiliac joints are unremarkable in appearance. IMPRESSION: Enteric tube noted ending overlying the antrum of the stomach. The stomach is largely filled with dense material. Electronically Signed   By: Garald Balding M.D.   On: 10/25/2015 02:49   Dg Abd Portable 1v-small Bowel Obstruction Protocol-initial, 8 Hr Delay  10/25/2015  CLINICAL DATA:  59 year old male with small bowel obstruction EXAM: PORTABLE ABDOMEN - 1 VIEW COMPARISON:  CT dated 10/24/2015 FINDINGS: Single portable view of the abdomen demonstrates severe distention of the stomach with oral contrast. Dilated loops of small bowel noted in the mid abdomen measuring up to 4 cm in diameter. No contrast identified within the colon. There is degenerative changes of the lower lumbar spine. No acute fracture. IMPRESSION: Severe distention of the stomach with dilated loops of small bowel in the mid abdomen. Consider placement of an enteric tube for decompression of the stomach. These results were called by telephone at the time of interpretation on 10/25/2015 at 1:16 am to nurse Marlowe Sax who verbally acknowledged these results. Electronically Signed   By: Anner Crete M.D.   On: 10/25/2015 01:17    Anti-infectives: Anti-infectives  Start     Dose/Rate Route Frequency Ordered Stop   10/24/15 1700  piperacillin-tazobactam (ZOSYN) IVPB 3.375 g     3.375 g 12.5 mL/hr over 240 Minutes Intravenous Every 8 hours 10/24/15 1523     10/24/15 1530  piperacillin-tazobactam (ZOSYN) IVPB 3.375 g  Status:  Discontinued     3.375 g 100 mL/hr over 30 Minutes Intravenous  Once 10/24/15 1520 10/24/15 1522   10/24/15 1045  piperacillin-tazobactam (ZOSYN) IVPB 3.375 g     3.375 g 100 mL/hr over 30 Minutes Intravenous  Once 10/24/15 1039 10/24/15 1122       Assessment/Plan  1. SBO -will repeat films this morning and see where his contrast currently is.   Given his NGT has not been working, we would really like to give him a good 24 hours on suction to determine if he is going to improve on his own or if he will need an operation. -would recommend transfer from [place] to a floor if felt medically stable, specifically the surgical floor, 6N. - i spoke to the director so she can educate her nursing staff on NGTs and what happened overnight.  2. ARI -resolved -cont fluid hydration given significant NGT output -follow electrolytes  LOS: 1 day    Arrington Bencomo E 10/25/2015, 8:30 AM Pager: [phone]

## 2015-10-26 ENCOUNTER — Inpatient Hospital Stay (HOSPITAL_COMMUNITY): Payer: Self-pay

## 2015-10-26 DIAGNOSIS — I10 Essential (primary) hypertension: Secondary | ICD-10-CM

## 2015-10-26 DIAGNOSIS — R109 Unspecified abdominal pain: Secondary | ICD-10-CM

## 2015-10-26 DIAGNOSIS — K56600 Partial intestinal obstruction, unspecified as to cause: Secondary | ICD-10-CM | POA: Insufficient documentation

## 2015-10-26 LAB — GLUCOSE, CAPILLARY
GLUCOSE-CAPILLARY: 110 mg/dL — AB (ref 65–99)
GLUCOSE-CAPILLARY: 117 mg/dL — AB (ref 65–99)
GLUCOSE-CAPILLARY: 99 mg/dL (ref 65–99)
Glucose-Capillary: 125 mg/dL — ABNORMAL HIGH (ref 65–99)
Glucose-Capillary: 137 mg/dL — ABNORMAL HIGH (ref 65–99)
Glucose-Capillary: 94 mg/dL (ref 65–99)

## 2015-10-26 LAB — BASIC METABOLIC PANEL
Anion gap: 10 (ref 5–15)
BUN: 19 mg/dL (ref 6–20)
CHLORIDE: 113 mmol/L — AB (ref 101–111)
CO2: 26 mmol/L (ref 22–32)
Calcium: 9 mg/dL (ref 8.9–10.3)
Creatinine, Ser: 1.02 mg/dL (ref 0.61–1.24)
Glucose, Bld: 122 mg/dL — ABNORMAL HIGH (ref 65–99)
POTASSIUM: 3.4 mmol/L — AB (ref 3.5–5.1)
SODIUM: 149 mmol/L — AB (ref 135–145)

## 2015-10-26 LAB — CBC
HCT: 37.1 % — ABNORMAL LOW (ref 39.0–52.0)
HEMOGLOBIN: 12.4 g/dL — AB (ref 13.0–17.0)
MCH: 29.6 pg (ref 26.0–34.0)
MCHC: 33.4 g/dL (ref 30.0–36.0)
MCV: 88.5 fL (ref 78.0–100.0)
PLATELETS: 208 10*3/uL (ref 150–400)
RBC: 4.19 MIL/uL — AB (ref 4.22–5.81)
RDW: 13.7 % (ref 11.5–15.5)
WBC: 8.2 10*3/uL (ref 4.0–10.5)

## 2015-10-26 MED ORDER — CHLORPROMAZINE HCL 25 MG/ML IJ SOLN
25.0000 mg | Freq: Three times a day (TID) | INTRAMUSCULAR | Status: DC | PRN
  Filled 2015-10-26: qty 1

## 2015-10-26 MED ORDER — INSULIN ASPART 100 UNIT/ML ~~LOC~~ SOLN
0.0000 [IU] | Freq: Three times a day (TID) | SUBCUTANEOUS | Status: DC
  Administered 2015-10-26: 1 [IU] via SUBCUTANEOUS

## 2015-10-26 NOTE — Progress Notes (Signed)
  Subjective: Passing flatus and had BM, pain better LLQ  Objective: Vital signs in last 24 hours: Temp:  [98.7 F (37.1 C)-99.3 F (37.4 C)] 98.7 F (37.1 C) (12/10 0526) Pulse Rate:  [86-110] 87 (12/10 0526) Resp:  [14-17] 17 (12/10 0526) BP: (108-135)/(77-82) 135/80 mmHg (12/10 0526) SpO2:  [95 %-100 %] 98 % (12/10 0526) Weight:  [94.1 kg (207 lb 7.3 oz)] 94.1 kg (207 lb 7.3 oz) (12/10 0526) Last BM Date: 10/25/15  Intake/Output from previous day: 12/09 0701 - 12/10 0700 In: 3370 [I.V.:3320; IV Piggyback:50] Out: 5726 [Urine:500; Emesis/NG output:5225; Stool:1] Intake/Output this shift: Total I/O In: 20 [P.O.:20] Out: 400 [Urine:400]  General appearance: cooperative Resp: clear to auscultation bilaterally Cardio: regular rate and rhythm GI: soft, distended, not sig tender, +BS  Lab Results:   Recent Labs  10/25/15 0313 10/26/15 0359  WBC 7.2 8.2  HGB 13.5 12.4*  HCT 40.3 37.1*  PLT 242 208   BMET  Recent Labs  10/25/15 0313 10/26/15 0359  NA 144 149*  K 3.5 3.4*  CL 108 113*  CO2 24 26  GLUCOSE 202* 122*  BUN 20 19  CREATININE 1.11 1.02  CALCIUM 9.4 9.0   Anti-infectives: Anti-infectives    Start     Dose/Rate Route Frequency Ordered Stop   10/24/15 1700  piperacillin-tazobactam (ZOSYN) IVPB 3.375 g     3.375 g 12.5 mL/hr over 240 Minutes Intravenous Every 8 hours 10/24/15 1523     10/24/15 1530  piperacillin-tazobactam (ZOSYN) IVPB 3.375 g  Status:  Discontinued     3.375 g 100 mL/hr over 30 Minutes Intravenous  Once 10/24/15 1520 10/24/15 1522   10/24/15 1045  piperacillin-tazobactam (ZOSYN) IVPB 3.375 g     3.375 g 100 mL/hr over 30 Minutes Intravenous  Once 10/24/15 1039 10/24/15 1122      Assessment/Plan: PSBO - film this AM has contrast in colon, dilated SB LLQ somewhat smaller. Try clamping NGT for 4h and possibly D/C.  LOS: 2 days    Aniket Paye E 10/26/2015

## 2015-10-26 NOTE — Progress Notes (Signed)
PROGRESS NOTE  Avaan Vink [id number] DOB: 19-Feb-1956 DOA: 10/24/2015 PCP: Imelda Pillow, NP   Brief History 59 year old male with a history of diabetes mellitus, hypertension, hyperlipidemia presented with 2 day history of worsening abdominal pain and distention. The patient also developed nausea and vomiting on the day prior to admission. Workup in the emergency department revealed tachycardia with lactic acid 5.9 CT abdomen and pelvis revealed small bowel obstruction with a transition point in the distal jejunum. Gen. surgery was consulted.NG tube was placed to suction with improvement of the patient's abdominal symptoms. The patient denies any previous history of a bowel obstruction or intra-abdominal surgeries.  Assessment/Plan: Small bowel obstruction  -Appreciate general surgery follow-up  -NG decompression-->repeat AXR on 10/26/15--improving SBO -10/26/2015--> remove NG and start clear liquids -Keep pertinent medications  IV for now until pt has reliable oral intake -Continue IV fluids  -10/24/2015 CT abdomen and pelvis--small bowel obstruction with transition point in the distal jejunum  Lactic acidosis  -Secondary to volume depletion  -Improving with intravenous fluid resuscitation  Leukocytosis  -Likely stress demargination  -Blood cultures negative  -Discontinue Zosyn  Acute kidney injury  -Secondary to volume depletion  -Improving with fluid resuscitation  Diabetes mellitus type 2  -Hemoglobin A1c--7.4  -Discontinue metformin  -NovoLog sliding scale  Hypertension  -Hold lisinopril in the setting of acute kidney injury  -Hold verapamil due to npo status  -Intravenous Lopressor until the patient has reliable oral intake -Discontinue HCTZ  Alcohol dependence  -CIWA protocol -pt has 2 shots brandy daily x 10 yrs  Family Communication: Pt at beside Disposition Plan: 1-2 days   Procedures/Studies: Ct Abdomen Pelvis Wo  Contrast  10/24/2015  CLINICAL DATA:  Epigastric pain starting this morning. Coffee ground emesis. Diaphoresis. EXAM: CT ABDOMEN AND PELVIS WITHOUT CONTRAST TECHNIQUE: Multidetector CT imaging of the abdomen and pelvis was performed following the standard protocol without IV contrast. COMPARISON:  None. FINDINGS: Lower chest: Lung bases show subpleural nodules measuring up to 7 mm in the lateral segment right middle lobe (series 3, image 4). Minimal subsegmental atelectasis is seen dependently in both lower lobes. Heart size normal. No pericardial or pleural effusion. Nasogastric tube is seen in the distal esophagus. Hepatobiliary: Liver appears slightly decreased in attenuation diffusely. Liver and gallbladder are otherwise unremarkable. No biliary ductal dilatation. Pancreas: Negative. Spleen: Negative. Adrenals/Urinary Tract: Right adrenal gland is unremarkable. Thickening of the left adrenal gland. Kidneys and ureters are unremarkable. Bladder is grossly unremarkable. Stomach/Bowel: Stomach is distended with oral contrast. Duodenum is decompressed. Jejunum is dilated with contrast seen only in the proximal jejunum. There is decompression of small bowel in the left lower quadrant. A discrete transition point is difficult to see due to lack of oral contrast in this area. Remainder of the small bowel is markedly decompressed, as is the colon. Vascular/Lymphatic: Vascular structures are unremarkable. No pathologically enlarged lymph nodes. Reproductive: Prostate is at the upper limits of normal in size. Other: Small amount of fluid is seen in the anterior left anatomic pelvis (series 2, image 71). Scattered haziness in the right-sided omentum (series 2, images 41 and 43), nonspecific. No nodularity. No free air. Musculoskeletal: No worrisome lytic or sclerotic lesions. IMPRESSION: 1. Small bowel obstruction with transition point likely in the distal jejunum. Please see discussion above. 2. Trace ascites. 3. Liver  appears fatty. 4. Mild haziness in the right sided omentum, nonspecific. 5. Subpleural nodules in the lung bases, likely subpleural lymph nodes.  If the patient is at high risk for bronchogenic carcinoma, follow-up chest CT at 3-89months is recommended. If the patient is at low risk for bronchogenic carcinoma, follow-up chest CT at 6-12 months is recommended. This recommendation follows the consensus statement: Guidelines for Management of Small Pulmonary Nodules Detected on CT Scans: A Statement from the [employer] as published in Radiology 2005; 237:395-400. Electronically Signed   By: Lorin Picket M.D.   On: 10/24/2015 14:18   Dg Chest Port 1 View  10/24/2015  CLINICAL DATA:  Abdominal pain and vomiting EXAM: PORTABLE CHEST 1 VIEW COMPARISON:  01/18/2009 FINDINGS: Normal heart size and mediastinal contours. Low volumes without acute infiltrate or edema. No effusion or pneumothorax. No acute osseous findings. Gas below the diaphragm appears intragastric. IMPRESSION: Negative low volume chest. Electronically Signed   By: Monte Fantasia M.D.   On: 10/24/2015 10:38   Dg Abd 2 Views  10/26/2015  CLINICAL DATA:  Partial small obstruction EXAM: ABDOMEN - 2 VIEW COMPARISON:  10/25/2015, CT 10/24/2015 FINDINGS: There is a NG tube extends to the gastric antrum. No dilated loops of large or small bowel. Oral contrast from CT of 10/24/2015 has progressed into the LEFT colon and rectum. There are multiple diverticula the colon noted. IMPRESSION: Improvement in small bowel obstruction with oral contrast progressing to the rectum. Electronically Signed   By: Suzy Bouchard M.D.   On: 10/26/2015 09:59   Dg Abd 2 Views  10/25/2015  CLINICAL DATA:  Follow-up small bowel obstruction. EXAM: ABDOMEN - 2 VIEW COMPARISON:  Earlier today FINDINGS: Interval shortening of nasogastric tube with tip either at the cardia or in the lower esophagus (uncertainty due to motion). Small bowel obstruction with unchanged bowel  distention and fluid levels. Oral contrast has reached the proximal colon. No evidence of pneumoperitoneum or pneumatosis. Lung bases are clear. IMPRESSION: 1. Ongoing small bowel obstruction, partial based on oral contrast in the colon. 2. Migrated nasogastric tube with tip either at the gastric cardia or lower esophagus. Electronically Signed   By: Monte Fantasia M.D.   On: 10/25/2015 10:08   Dg Abd Portable 1v  10/25/2015  CLINICAL DATA:  Nasogastric tube placement.  Initial encounter. EXAM: PORTABLE ABDOMEN - 1 VIEW COMPARISON:  Abdominal radiograph performed earlier today at 12:39 a.m. FINDINGS: The patient's enteric tube is noted ending overlying the antrum of the stomach. The stomach is largely filled with dense material. The visualized bowel gas pattern is unremarkable. Scattered air and stool filled loops of colon are seen; no abnormal dilatation of small bowel loops is seen to suggest small bowel obstruction. No free intra-abdominal air is identified, though evaluation for free air is limited on a single supine view. The visualized osseous structures are within normal limits; the sacroiliac joints are unremarkable in appearance. IMPRESSION: Enteric tube noted ending overlying the antrum of the stomach. The stomach is largely filled with dense material. Electronically Signed   By: Garald Balding M.D.   On: 10/25/2015 02:49   Dg Abd Portable 1v-small Bowel Obstruction Protocol-initial, 8 Hr Delay  10/25/2015  CLINICAL DATA:  59 year old male with small bowel obstruction EXAM: PORTABLE ABDOMEN - 1 VIEW COMPARISON:  CT dated 10/24/2015 FINDINGS: Single portable view of the abdomen demonstrates severe distention of the stomach with oral contrast. Dilated loops of small bowel noted in the mid abdomen measuring up to 4 cm in diameter. No contrast identified within the colon. There is degenerative changes of the lower lumbar spine. No acute fracture. IMPRESSION: Severe distention  of the stomach with dilated  loops of small bowel in the mid abdomen. Consider placement of an enteric tube for decompression of the stomach. These results were called by telephone at the time of interpretation on 10/25/2015 at 1:16 am to nurse Marlowe Sax who verbally acknowledged these results. Electronically Signed   By: Anner Crete M.D.   On: 10/25/2015 01:17         Subjective: Patient is feeling better. States abdominal pain is improved. The patient is a 3 bowel movements for 4 hours. Denies any fevers, chills, chest pain contrast but, vomiting, diarrhea. No dysuria or hematuria.   Objective: Filed Vitals:   10/25/15 1451 10/25/15 2157 10/26/15 0526 10/26/15 1445  BP: 135/82 131/82 135/80 142/87  Pulse: 110 98 87 83  Temp: 99.2 F (37.3 C) 99.3 F (37.4 C) 98.7 F (37.1 C) 98.1 F (36.7 C)  TempSrc: Oral Oral  Oral  Resp: 16 17 17 17   Height:      Weight:   94.1 kg (207 lb 7.3 oz)   SpO2: 96% 100% 98% 100%    Intake/Output Summary (Last 24 hours) at 10/26/15 1636 Last data filed at 10/26/15 1448  Gross per 24 hour  Intake   4059 ml  Output   2878 ml  Net   1181 ml   Weight change: -1.155 kg (-2 lb 8.8 oz) Exam:   General:  Pt is alert, follows commands appropriately, not in acute distress  HEENT: No icterus, No thrush, No neck mass, [place]/AT  Cardiovascular: RRR, S1/S2, no rubs, no gallops  Respiratory: CTA bilaterally, no wheezing, no crackles, no rhonchi  Abdomen: Soft/+BS, non tender, non distended, no guarding  Extremities: No edema, No lymphangitis, No petechiae, No rashes, no synovitis  Data Reviewed: Basic Metabolic Panel:  Recent Labs Lab 10/24/15 1009 10/24/15 1028 10/24/15 1933 10/25/15 0313 10/26/15 0359  NA 137 137 144 144 149*  K 4.1 3.8 3.7 3.5 3.4*  CL 97* 98* 109 108 113*  CO2 21*  --  27 24 26   GLUCOSE 413* 431* 200* 202* 122*  BUN 27* 31* 20 20 19   CREATININE 1.98* 1.70* 1.13 1.11 1.02  CALCIUM 11.0*  --  9.4 9.4 9.0   Liver Function Tests:  Recent  Labs Lab 10/24/15 1009 10/25/15 0313  AST 31 22  ALT 34 25  ALKPHOS 75 50  BILITOT 1.7* 1.1  PROT 8.8* 7.0  ALBUMIN 4.3 3.7    Recent Labs Lab 10/24/15 1009  LIPASE 26   No results for input(s): AMMONIA in the last 168 hours. CBC:  Recent Labs Lab 10/24/15 1009 10/24/15 1028 10/24/15 1645 10/25/15 0313 10/26/15 0359  WBC 13.0*  --  8.9 7.2 8.2  NEUTROABS 10.8*  --  6.4  --   --   HGB 16.5 18.4* 13.6 13.5 12.4*  HCT 48.0 54.0* 39.2 40.3 37.1*  MCV 86.8  --  86.0 87.2 88.5  PLT 320  --  230 242 208   Cardiac Enzymes: No results for input(s): CKTOTAL, CKMB, CKMBINDEX, TROPONINI in the last 168 hours. BNP: Invalid input(s): POCBNP CBG:  Recent Labs Lab 10/25/15 1625 10/25/15 2024 10/26/15 0003 10/26/15 0405 10/26/15 0741  GLUCAP 123* 130* 117* 110* 125*    Recent Results (from the past 240 hour(s))  Culture, blood (x 2)     Status: None (Preliminary result)   Collection Time: 10/24/15  4:28 PM  Result Value Ref Range Status   Specimen Description BLOOD LEFT WRIST  Final  Special Requests IN PEDIATRIC BOTTLE 1CC  Final   Culture NO GROWTH 2 DAYS  Final   Report Status PENDING  Incomplete  Culture, blood (x 2)     Status: None (Preliminary result)   Collection Time: 10/24/15  4:45 PM  Result Value Ref Range Status   Specimen Description BLOOD RIGHT HAND  Final   Special Requests BOTTLES DRAWN AEROBIC AND ANAEROBIC 5CC  Final   Culture NO GROWTH 2 DAYS  Final   Report Status PENDING  Incomplete  MRSA PCR Screening     Status: None   Collection Time: 10/24/15  6:15 PM  Result Value Ref Range Status   MRSA by PCR NEGATIVE NEGATIVE Final    Comment:        The GeneXpert MRSA Assay (FDA approved for NASAL specimens only), is one component of a comprehensive MRSA colonization surveillance program. It is not intended to diagnose MRSA infection nor to guide or monitor treatment for MRSA infections.      Scheduled Meds: . antiseptic oral rinse  7  mL Mouth Rinse BID  . heparin  5,000 Units Subcutaneous 3 times per day  . insulin aspart  0-9 Units Subcutaneous 6 times per day  . metoprolol  2.5 mg Intravenous 4 times per day  . piperacillin-tazobactam (ZOSYN)  IV  3.375 g Intravenous Q8H  . thiamine  100 mg Oral Daily   Or  . thiamine  100 mg Intravenous Daily   Continuous Infusions: . sodium chloride 150 mL/hr at 10/26/15 1302     Allesha Aronoff, DO  [facility] Pager [phone]  If 7PM-7AM, please contact night-coverage [email or web address] Password [email or web address] 10/26/2015, 4:36 PM   LOS: 2 days

## 2015-10-26 NOTE — Progress Notes (Signed)
At 1400, checked NGT to see if any residual, none.  No c/o of abd pain or nausea.  D/C'd NGT.

## 2015-10-27 DIAGNOSIS — A419 Sepsis, unspecified organism: Secondary | ICD-10-CM

## 2015-10-27 DIAGNOSIS — R6520 Severe sepsis without septic shock: Secondary | ICD-10-CM

## 2015-10-27 LAB — GLUCOSE, CAPILLARY
GLUCOSE-CAPILLARY: 98 mg/dL (ref 65–99)
GLUCOSE-CAPILLARY: 121 mg/dL — AB (ref 65–99)

## 2015-10-27 NOTE — Progress Notes (Signed)
  Subjective: Having bms and flatus, no n/v, hungry  Objective: Vital signs in last 24 hours: Temp:  [98.1 F (36.7 C)-99.4 F (37.4 C)] 99.2 F (37.3 C) (12/11 0544) Pulse Rate:  [71-83] 71 (12/11 0544) Resp:  [17-18] 18 (12/11 0544) BP: (116-142)/(76-87) 116/76 mmHg (12/11 0544) SpO2:  [97 %-100 %] 97 % (12/11 0544) Weight:  [98.6 kg (217 lb 6 oz)] 98.6 kg (217 lb 6 oz) (12/11 0500) Last BM Date: 11/20/2015  Intake/Output from previous day: Nov 20, 2023 0701 - 12/11 0700 In: 5201.5 [P.O.:1780; I.V.:3271.5; IV Piggyback:150] Out: 452 [Urine:400; Emesis/NG output:50; Stool:2] Intake/Output this shift: Total I/O In: -  Out: 375 [Urine:375]  GI: soft nt nd  Lab Results:   Recent Labs  10/25/15 0313 11/20/2015 0359  WBC 7.2 8.2  HGB 13.5 12.4*  HCT 40.3 37.1*  PLT 242 208   BMET  Recent Labs  10/25/15 0313 11-20-2015 0359  NA 144 149*  K 3.5 3.4*  CL 108 113*  CO2 24 26  GLUCOSE 202* 122*  BUN 20 19  CREATININE 1.11 1.02  CALCIUM 9.4 9.0   PT/INR  Recent Labs  10/24/15 1009 10/24/15 1712  LABPROT 15.2 15.0  INR 1.19 1.16   ABG No results for input(s): PHART, HCO3 in the last 72 hours.  Invalid input(s): PCO2, PO2  Studies/Results: Dg Abd 2 Views  2015-11-20  CLINICAL DATA:  Partial small obstruction EXAM: ABDOMEN - 2 VIEW COMPARISON:  10/25/2015, CT 10/24/2015 FINDINGS: There is a NG tube extends to the gastric antrum. No dilated loops of large or small bowel. Oral contrast from CT of 10/24/2015 has progressed into the LEFT colon and rectum. There are multiple diverticula the colon noted. IMPRESSION: Improvement in small bowel obstruction with oral contrast progressing to the rectum. Electronically Signed   By: Suzy Bouchard M.D.   On: 20-Nov-2015 09:59   Dg Abd 2 Views  10/25/2015  CLINICAL DATA:  Follow-up small bowel obstruction. EXAM: ABDOMEN - 2 VIEW COMPARISON:  Earlier today FINDINGS: Interval shortening of nasogastric tube with tip either at the  cardia or in the lower esophagus (uncertainty due to motion). Small bowel obstruction with unchanged bowel distention and fluid levels. Oral contrast has reached the proximal colon. No evidence of pneumoperitoneum or pneumatosis. Lung bases are clear. IMPRESSION: 1. Ongoing small bowel obstruction, partial based on oral contrast in the colon. 2. Migrated nasogastric tube with tip either at the gastric cardia or lower esophagus. Electronically Signed   By: Monte Fantasia M.D.   On: 10/25/2015 10:08    Anti-infectives: Anti-infectives    Start     Dose/Rate Route Frequency Ordered Stop   10/24/15 1700  piperacillin-tazobactam (ZOSYN) IVPB 3.375 g  Status:  Discontinued     3.375 g 12.5 mL/hr over 240 Minutes Intravenous Every 8 hours 10/24/15 1523 20-Nov-2015 1650   10/24/15 1530  piperacillin-tazobactam (ZOSYN) IVPB 3.375 g  Status:  Discontinued     3.375 g 100 mL/hr over 30 Minutes Intravenous  Once 10/24/15 1520 10/24/15 1522   10/24/15 1045  piperacillin-tazobactam (ZOSYN) IVPB 3.375 g     3.375 g 100 mL/hr over 30 Minutes Intravenous  Once 10/24/15 1039 10/24/15 1122      Assessment/Plan: psbo resolved  Has not had prior surgery but this has resolved quickly with no other concerns on exam or ct Can advance diet and dc today if tolerates Will follow up as needed  [facility] 10/27/2015

## 2015-10-27 NOTE — Progress Notes (Signed)
Pt ready for discharge to home.

## 2015-10-27 NOTE — Discharge Summary (Addendum)
Physician Discharge Summary  Jeremiah Bowman [id number] DOB: 1956-05-30 DOA: 10/24/2015  PCP: Imelda Pillow, NP  Admit date: 10/24/2015 Discharge date: 10/27/2015  Recommendations for Outpatient Follow-up:  1. Pt will need to follow up with PCP in 2 weeks post discharge 2. Please obtain BMP in one week Discharge Diagnoses:   Small bowel obstruction  -Appreciate general surgery follow-up  -NG decompression-->repeat AXR on 10/26/15--improving SBO -10/26/2015--> remove NG and start clear liquids -10/27/15 diet advanced to soft diet-->tolerated -Keep pertinent medications IV for now until pt has reliable oral intake -Continued IV fluids while pt was npo -10/24/2015 CT abdomen and pelvis--small bowel obstruction with transition point in the distal jejunum  Lactic acidosis  -Secondary to volume depletion  -Improving with intravenous fluid resuscitation  Leukocytosis  -Likely stress demargination  -Blood cultures negative  -Discontinue Zosyn--remained afebrile and hemodynamically stable Acute kidney injury  -Secondary to volume depletion  -Improving with fluid resuscitation  Diabetes mellitus type 2  -Hemoglobin A1c--7.4  -Discontinue metformin-->resume after discharge -NovoLog sliding scale while in hospital Hypertension  -Hold lisinopril in the setting of acute kidney injury  -Hold verapamil due to npo status--restart after d/c -Intravenous Lopressor until the patient has reliable oral intake -Discontinue HCTZ  -instructed pt to stop taking lisinopril/HCTZ in the setting of AKI--BP remains controlled -follow up with PCP to determine need to restart Alcohol dependence  -CIWA protocol -pt has 2 shots brandy daily x 10 yrs   Discharge Condition: stable  Disposition: home  Diet:soft Wt Readings from Last 3 Encounters:  10/27/15 98.6 kg (217 lb 6 oz)    History of present illness:  59 year old male with a history of diabetes mellitus,  hypertension, hyperlipidemia presented with 2 day history of worsening abdominal pain and distention. The patient also developed nausea and vomiting on the day prior to admission. Workup in the emergency department revealed tachycardia with lactic acid 5.9 CT abdomen and pelvis revealed small bowel obstruction with a transition point in the distal jejunum. Gen. surgery was consulted.NG tube was placed to suction with improvement of the patient's abdominal symptoms. The patient denies any previous history of a bowel obstruction or intra-abdominal surgeries. The patient improved clinically with conservative therapy. Repeat abdominal x-ray showed improvement of the patient's multiple obstruction. The patient's NG tube was removed and his diet was advanced. The patient tolerated his diet without difficulty.  Consultants: General surgery  Discharge Exam: Filed Vitals:   10/26/15 2213 10/27/15 0544  BP: 133/83 116/76  Pulse: 78 71  Temp: 99.4 F (37.4 C) 99.2 F (37.3 C)  Resp: 18 18   Filed Vitals:   10/26/15 1445 10/26/15 2213 10/27/15 0500 10/27/15 0544  BP: 142/87 133/83  116/76  Pulse: 83 78  71  Temp: 98.1 F (36.7 C) 99.4 F (37.4 C)  99.2 F (37.3 C)  TempSrc: Oral Oral  Oral  Resp: 17 18  18   Height:      Weight:   98.6 kg (217 lb 6 oz)   SpO2: 100% 98%  97%   General: A&O x 3, NAD, pleasant, cooperative Cardiovascular: RRR, no rub, no gallop, no S3 Respiratory: CTAB, no wheeze, no rhonchi Abdomen:soft, nontender, nondistended, positive bowel sounds Extremities: No edema, No lymphangitis, no petechiae  Discharge Instructions     Medication List    STOP taking these medications        lisinopril-hydrochlorothiazide 20-12.5 MG tablet  Commonly known as:  PRINZIDE,ZESTORETIC      TAKE these medications  aspirin 81 MG chewable tablet  Chew 81 mg by mouth daily.     metFORMIN 1000 MG tablet  Commonly known as:  GLUCOPHAGE  Take 1,000 mg by mouth 2 (two)  times daily with a meal.     pravastatin 20 MG tablet  Commonly known as:  PRAVACHOL  Take 20 mg by mouth daily.     verapamil 180 MG CR tablet  Commonly known as:  CALAN-SR  Take 180 mg by mouth daily.         The results of significant diagnostics from this hospitalization (including imaging, microbiology, ancillary and laboratory) are listed below for reference.    Significant Diagnostic Studies: Ct Abdomen Pelvis Wo Contrast  10/24/2015  CLINICAL DATA:  Epigastric pain starting this morning. Coffee ground emesis. Diaphoresis. EXAM: CT ABDOMEN AND PELVIS WITHOUT CONTRAST TECHNIQUE: Multidetector CT imaging of the abdomen and pelvis was performed following the standard protocol without IV contrast. COMPARISON:  None. FINDINGS: Lower chest: Lung bases show subpleural nodules measuring up to 7 mm in the lateral segment right middle lobe (series 3, image 4). Minimal subsegmental atelectasis is seen dependently in both lower lobes. Heart size normal. No pericardial or pleural effusion. Nasogastric tube is seen in the distal esophagus. Hepatobiliary: Liver appears slightly decreased in attenuation diffusely. Liver and gallbladder are otherwise unremarkable. No biliary ductal dilatation. Pancreas: Negative. Spleen: Negative. Adrenals/Urinary Tract: Right adrenal gland is unremarkable. Thickening of the left adrenal gland. Kidneys and ureters are unremarkable. Bladder is grossly unremarkable. Stomach/Bowel: Stomach is distended with oral contrast. Duodenum is decompressed. Jejunum is dilated with contrast seen only in the proximal jejunum. There is decompression of small bowel in the left lower quadrant. A discrete transition point is difficult to see due to lack of oral contrast in this area. Remainder of the small bowel is markedly decompressed, as is the colon. Vascular/Lymphatic: Vascular structures are unremarkable. No pathologically enlarged lymph nodes. Reproductive: Prostate is at the upper  limits of normal in size. Other: Small amount of fluid is seen in the anterior left anatomic pelvis (series 2, image 71). Scattered haziness in the right-sided omentum (series 2, images 41 and 43), nonspecific. No nodularity. No free air. Musculoskeletal: No worrisome lytic or sclerotic lesions. IMPRESSION: 1. Small bowel obstruction with transition point likely in the distal jejunum. Please see discussion above. 2. Trace ascites. 3. Liver appears fatty. 4. Mild haziness in the right sided omentum, nonspecific. 5. Subpleural nodules in the lung bases, likely subpleural lymph nodes. If the patient is at high risk for bronchogenic carcinoma, follow-up chest CT at 3-20months is recommended. If the patient is at low risk for bronchogenic carcinoma, follow-up chest CT at 6-12 months is recommended. This recommendation follows the consensus statement: Guidelines for Management of Small Pulmonary Nodules Detected on CT Scans: A Statement from the [employer] as published in Radiology 2005; 237:395-400. Electronically Signed   By: Lorin Picket M.D.   On: 10/24/2015 14:18   Dg Chest Port 1 View  10/24/2015  CLINICAL DATA:  Abdominal pain and vomiting EXAM: PORTABLE CHEST 1 VIEW COMPARISON:  01/18/2009 FINDINGS: Normal heart size and mediastinal contours. Low volumes without acute infiltrate or edema. No effusion or pneumothorax. No acute osseous findings. Gas below the diaphragm appears intragastric. IMPRESSION: Negative low volume chest. Electronically Signed   By: Monte Fantasia M.D.   On: 10/24/2015 10:38   Dg Abd 2 Views  10/26/2015  CLINICAL DATA:  Partial small obstruction EXAM: ABDOMEN - 2 VIEW COMPARISON:  10/25/2015,  CT 10/24/2015 FINDINGS: There is a NG tube extends to the gastric antrum. No dilated loops of large or small bowel. Oral contrast from CT of 10/24/2015 has progressed into the LEFT colon and rectum. There are multiple diverticula the colon noted. IMPRESSION: Improvement in small bowel  obstruction with oral contrast progressing to the rectum. Electronically Signed   By: Suzy Bouchard M.D.   On: 10/26/2015 09:59   Dg Abd 2 Views  10/25/2015  CLINICAL DATA:  Follow-up small bowel obstruction. EXAM: ABDOMEN - 2 VIEW COMPARISON:  Earlier today FINDINGS: Interval shortening of nasogastric tube with tip either at the cardia or in the lower esophagus (uncertainty due to motion). Small bowel obstruction with unchanged bowel distention and fluid levels. Oral contrast has reached the proximal colon. No evidence of pneumoperitoneum or pneumatosis. Lung bases are clear. IMPRESSION: 1. Ongoing small bowel obstruction, partial based on oral contrast in the colon. 2. Migrated nasogastric tube with tip either at the gastric cardia or lower esophagus. Electronically Signed   By: Monte Fantasia M.D.   On: 10/25/2015 10:08   Dg Abd Portable 1v  10/25/2015  CLINICAL DATA:  Nasogastric tube placement.  Initial encounter. EXAM: PORTABLE ABDOMEN - 1 VIEW COMPARISON:  Abdominal radiograph performed earlier today at 12:39 a.m. FINDINGS: The patient's enteric tube is noted ending overlying the antrum of the stomach. The stomach is largely filled with dense material. The visualized bowel gas pattern is unremarkable. Scattered air and stool filled loops of colon are seen; no abnormal dilatation of small bowel loops is seen to suggest small bowel obstruction. No free intra-abdominal air is identified, though evaluation for free air is limited on a single supine view. The visualized osseous structures are within normal limits; the sacroiliac joints are unremarkable in appearance. IMPRESSION: Enteric tube noted ending overlying the antrum of the stomach. The stomach is largely filled with dense material. Electronically Signed   By: Garald Balding M.D.   On: 10/25/2015 02:49   Dg Abd Portable 1v-small Bowel Obstruction Protocol-initial, 8 Hr Delay  10/25/2015  CLINICAL DATA:  59 year old male with small bowel  obstruction EXAM: PORTABLE ABDOMEN - 1 VIEW COMPARISON:  CT dated 10/24/2015 FINDINGS: Single portable view of the abdomen demonstrates severe distention of the stomach with oral contrast. Dilated loops of small bowel noted in the mid abdomen measuring up to 4 cm in diameter. No contrast identified within the colon. There is degenerative changes of the lower lumbar spine. No acute fracture. IMPRESSION: Severe distention of the stomach with dilated loops of small bowel in the mid abdomen. Consider placement of an enteric tube for decompression of the stomach. These results were called by telephone at the time of interpretation on 10/25/2015 at 1:16 am to nurse Marlowe Sax who verbally acknowledged these results. Electronically Signed   By: Anner Crete M.D.   On: 10/25/2015 01:17     Microbiology: Recent Results (from the past 240 hour(s))  Culture, blood (x 2)     Status: None (Preliminary result)   Collection Time: 10/24/15  4:28 PM  Result Value Ref Range Status   Specimen Description BLOOD LEFT WRIST  Final   Special Requests IN PEDIATRIC BOTTLE [place]  Final   Culture NO GROWTH 3 DAYS  Final   Report Status PENDING  Incomplete  Culture, blood (x 2)     Status: None (Preliminary result)   Collection Time: 10/24/15  4:45 PM  Result Value Ref Range Status   Specimen Description BLOOD RIGHT HAND  Final  Special Requests BOTTLES DRAWN AEROBIC AND ANAEROBIC 5CC  Final   Culture NO GROWTH 3 DAYS  Final   Report Status PENDING  Incomplete  MRSA PCR Screening     Status: None   Collection Time: 10/24/15  6:15 PM  Result Value Ref Range Status   MRSA by PCR NEGATIVE NEGATIVE Final    Comment:        The GeneXpert MRSA Assay (FDA approved for NASAL specimens only), is one component of a comprehensive MRSA colonization surveillance program. It is not intended to diagnose MRSA infection nor to guide or monitor treatment for MRSA infections.      Labs: Basic Metabolic Panel:  Recent  Labs Lab 10/24/15 1009 10/24/15 1028 10/24/15 1933 10/25/15 0313 10/26/15 0359  NA 137 137 144 144 149*  K 4.1 3.8 3.7 3.5 3.4*  CL 97* 98* 109 108 113*  CO2 21*  --  27 24 26   GLUCOSE 413* 431* 200* 202* 122*  BUN 27* 31* 20 20 19   CREATININE 1.98* 1.70* 1.13 1.11 1.02  CALCIUM 11.0*  --  9.4 9.4 9.0   Liver Function Tests:  Recent Labs Lab 10/24/15 1009 10/25/15 0313  AST 31 22  ALT 34 25  ALKPHOS 75 50  BILITOT 1.7* 1.1  PROT 8.8* 7.0  ALBUMIN 4.3 3.7    Recent Labs Lab 10/24/15 1009  LIPASE 26   No results for input(s): AMMONIA in the last 168 hours. CBC:  Recent Labs Lab 10/24/15 1009 10/24/15 1028 10/24/15 1645 10/25/15 0313 10/26/15 0359  WBC 13.0*  --  8.9 7.2 8.2  NEUTROABS 10.8*  --  6.4  --   --   HGB 16.5 18.4* 13.6 13.5 12.4*  HCT 48.0 54.0* 39.2 40.3 37.1*  MCV 86.8  --  86.0 87.2 88.5  PLT 320  --  230 242 208   Cardiac Enzymes: No results for input(s): CKTOTAL, CKMB, CKMBINDEX, TROPONINI in the last 168 hours. BNP: Invalid input(s): POCBNP CBG:  Recent Labs Lab 10/26/15 1218 10/26/15 1632 10/26/15 2212 10/27/15 0748 10/27/15 1201  GLUCAP 99 137* 94 98 121*    Time coordinating discharge:  Greater than 30 minutes  Signed:  Zaine Elsass, DO [facility] Pager: [phone] 10/27/2015, 12:42 PM

## 2015-10-27 NOTE — Progress Notes (Signed)
Reviewed DC instructions and copy given to pt.  Instructed to see PCP 1-2 weeks and for blood draw BMP 1 week as per Dr. Doristine Devoid notes.  Pt understands.  Also, his CBG meter is broken.  Informed him and he agreed to get a new meter and to check his CBG regularly.  No further questions about home self care.

## 2015-10-29 LAB — CULTURE, BLOOD (ROUTINE X 2)
CULTURE: NO GROWTH
CULTURE: NO GROWTH

## 2017-05-13 ENCOUNTER — Encounter (HOSPITAL_COMMUNITY): Payer: Self-pay | Admitting: Emergency Medicine

## 2017-05-13 ENCOUNTER — Inpatient Hospital Stay (HOSPITAL_COMMUNITY)
Admission: EM | Admit: 2017-05-13 | Discharge: 2017-05-16 | DRG: 638 | Disposition: A | Discharge status: Home / Self Care | Payer: Self-pay | Attending: Internal Medicine | Admitting: Internal Medicine

## 2017-05-13 ENCOUNTER — Inpatient Hospital Stay (HOSPITAL_COMMUNITY): Payer: Self-pay

## 2017-05-13 ENCOUNTER — Emergency Department (HOSPITAL_COMMUNITY): Payer: Self-pay

## 2017-05-13 DIAGNOSIS — E860 Dehydration: Secondary | ICD-10-CM | POA: Insufficient documentation

## 2017-05-13 DIAGNOSIS — E1165 Type 2 diabetes mellitus with hyperglycemia: Secondary | ICD-10-CM

## 2017-05-13 DIAGNOSIS — E118 Type 2 diabetes mellitus with unspecified complications: Secondary | ICD-10-CM | POA: Insufficient documentation

## 2017-05-13 DIAGNOSIS — Z7982 Long term (current) use of aspirin: Secondary | ICD-10-CM

## 2017-05-13 DIAGNOSIS — Z791 Long term (current) use of non-steroidal anti-inflammatories (NSAID): Secondary | ICD-10-CM

## 2017-05-13 DIAGNOSIS — I10 Essential (primary) hypertension: Secondary | ICD-10-CM | POA: Diagnosis present

## 2017-05-13 DIAGNOSIS — Z8249 Family history of ischemic heart disease and other diseases of the circulatory system: Secondary | ICD-10-CM

## 2017-05-13 DIAGNOSIS — Z79899 Other long term (current) drug therapy: Secondary | ICD-10-CM

## 2017-05-13 DIAGNOSIS — R112 Nausea with vomiting, unspecified: Secondary | ICD-10-CM | POA: Diagnosis present

## 2017-05-13 DIAGNOSIS — K56600 Partial intestinal obstruction, unspecified as to cause: Secondary | ICD-10-CM | POA: Diagnosis present

## 2017-05-13 DIAGNOSIS — Z7984 Long term (current) use of oral hypoglycemic drugs: Secondary | ICD-10-CM

## 2017-05-13 DIAGNOSIS — N179 Acute kidney failure, unspecified: Secondary | ICD-10-CM | POA: Diagnosis present

## 2017-05-13 DIAGNOSIS — IMO0002 Reserved for concepts with insufficient information to code with codable children: Secondary | ICD-10-CM

## 2017-05-13 DIAGNOSIS — K567 Ileus, unspecified: Secondary | ICD-10-CM | POA: Diagnosis present

## 2017-05-13 DIAGNOSIS — K56609 Unspecified intestinal obstruction, unspecified as to partial versus complete obstruction: Secondary | ICD-10-CM

## 2017-05-13 DIAGNOSIS — E872 Acidosis, unspecified: Secondary | ICD-10-CM | POA: Diagnosis present

## 2017-05-13 DIAGNOSIS — E785 Hyperlipidemia, unspecified: Secondary | ICD-10-CM | POA: Diagnosis present

## 2017-05-13 DIAGNOSIS — Z9111 Patient's noncompliance with dietary regimen: Secondary | ICD-10-CM

## 2017-05-13 DIAGNOSIS — Z9114 Patient's other noncompliance with medication regimen: Secondary | ICD-10-CM

## 2017-05-13 DIAGNOSIS — R000 Tachycardia, unspecified: Secondary | ICD-10-CM | POA: Diagnosis present

## 2017-05-13 DIAGNOSIS — E1143 Type 2 diabetes mellitus with diabetic autonomic (poly)neuropathy: Secondary | ICD-10-CM | POA: Diagnosis present

## 2017-05-13 DIAGNOSIS — E119 Type 2 diabetes mellitus without complications: Secondary | ICD-10-CM

## 2017-05-13 DIAGNOSIS — A419 Sepsis, unspecified organism: Secondary | ICD-10-CM | POA: Diagnosis present

## 2017-05-13 DIAGNOSIS — R739 Hyperglycemia, unspecified: Secondary | ICD-10-CM

## 2017-05-13 DIAGNOSIS — K3184 Gastroparesis: Secondary | ICD-10-CM | POA: Diagnosis present

## 2017-05-13 DIAGNOSIS — Z87891 Personal history of nicotine dependence: Secondary | ICD-10-CM

## 2017-05-13 DIAGNOSIS — E876 Hypokalemia: Secondary | ICD-10-CM

## 2017-05-13 DIAGNOSIS — R6510 Systemic inflammatory response syndrome (SIRS) of non-infectious origin without acute organ dysfunction: Secondary | ICD-10-CM | POA: Diagnosis present

## 2017-05-13 DIAGNOSIS — E1110 Type 2 diabetes mellitus with ketoacidosis without coma: Principal | ICD-10-CM | POA: Diagnosis present

## 2017-05-13 DIAGNOSIS — R109 Unspecified abdominal pain: Secondary | ICD-10-CM | POA: Diagnosis present

## 2017-05-13 DIAGNOSIS — Z0189 Encounter for other specified special examinations: Secondary | ICD-10-CM

## 2017-05-13 HISTORY — DX: Acute kidney failure, unspecified: N17.9

## 2017-05-13 HISTORY — DX: Type 2 diabetes mellitus with ketoacidosis without coma: E11.10

## 2017-05-13 LAB — CBC WITH DIFFERENTIAL/PLATELET
BASOS PCT: 0 %
Basophils Absolute: 0 10*3/uL (ref 0.0–0.1)
EOS PCT: 0 %
Eosinophils Absolute: 0 10*3/uL (ref 0.0–0.7)
HEMATOCRIT: 42.9 % (ref 39.0–52.0)
Hemoglobin: 14.9 g/dL (ref 13.0–17.0)
LYMPHS ABS: 2 10*3/uL (ref 0.7–4.0)
Lymphocytes Relative: 14 %
MCH: 29.7 pg (ref 26.0–34.0)
MCHC: 34.7 g/dL (ref 30.0–36.0)
MCV: 85.5 fL (ref 78.0–100.0)
MONOS PCT: 14 %
Monocytes Absolute: 2 10*3/uL — ABNORMAL HIGH (ref 0.1–1.0)
NEUTROS ABS: 10.5 10*3/uL — AB (ref 1.7–7.7)
Neutrophils Relative %: 72 %
Platelets: 245 10*3/uL (ref 150–400)
RBC: 5.02 MIL/uL (ref 4.22–5.81)
RDW: 13.9 % (ref 11.5–15.5)
WBC Morphology: INCREASED
WBC: 14.5 10*3/uL — ABNORMAL HIGH (ref 4.0–10.5)

## 2017-05-13 LAB — COMPREHENSIVE METABOLIC PANEL
ALK PHOS: 62 U/L (ref 38–126)
ALT: 30 U/L (ref 17–63)
ALT: 25 U/L (ref 17–63)
ANION GAP: 21 — AB (ref 5–15)
ANION GAP: 13 (ref 5–15)
AST: 29 U/L (ref 15–41)
AST: 26 U/L (ref 15–41)
Albumin: 4.2 g/dL (ref 3.5–5.0)
Albumin: 3.4 g/dL — ABNORMAL LOW (ref 3.5–5.0)
Alkaline Phosphatase: 78 U/L (ref 38–126)
BILIRUBIN TOTAL: 1.4 mg/dL — AB (ref 0.3–1.2)
BILIRUBIN TOTAL: 1 mg/dL (ref 0.3–1.2)
BUN: 40 mg/dL — AB (ref 6–20)
BUN: 44 mg/dL — AB (ref 6–20)
CALCIUM: 8.9 mg/dL (ref 8.9–10.3)
CHLORIDE: 91 mmol/L — AB (ref 101–111)
CO2: 22 mmol/L (ref 22–32)
CO2: 21 mmol/L — ABNORMAL LOW (ref 22–32)
Calcium: 9.9 mg/dL (ref 8.9–10.3)
Chloride: 105 mmol/L (ref 101–111)
Creatinine, Ser: 2.61 mg/dL — ABNORMAL HIGH (ref 0.61–1.24)
Creatinine, Ser: 2.49 mg/dL — ABNORMAL HIGH (ref 0.61–1.24)
GFR calc Af Amer: 30 mL/min — ABNORMAL LOW (ref >60)
GFR, EST AFRICAN AMERICAN: 29 mL/min — AB (ref >60)
GFR, EST NON AFRICAN AMERICAN: 25 mL/min — AB (ref >60)
GFR, EST NON AFRICAN AMERICAN: 26 mL/min — AB (ref >60)
Glucose, Bld: 615 mg/dL (ref 65–99)
Glucose, Bld: 140 mg/dL — ABNORMAL HIGH (ref 65–99)
POTASSIUM: 4.2 mmol/L (ref 3.5–5.1)
POTASSIUM: 3.9 mmol/L (ref 3.5–5.1)
Sodium: 134 mmol/L — ABNORMAL LOW (ref 135–145)
Sodium: 139 mmol/L (ref 135–145)
TOTAL PROTEIN: 7.8 g/dL (ref 6.5–8.1)
TOTAL PROTEIN: 6.7 g/dL (ref 6.5–8.1)

## 2017-05-13 LAB — URINALYSIS, ROUTINE W REFLEX MICROSCOPIC
BILIRUBIN URINE: NEGATIVE
KETONES UR: NEGATIVE mg/dL
NITRITE: NEGATIVE
PH: 5 (ref 5.0–8.0)
PROTEIN: 30 mg/dL — AB
Specific Gravity, Urine: 1.013 (ref 1.005–1.030)

## 2017-05-13 LAB — CBC
HEMATOCRIT: 46.4 % (ref 39.0–52.0)
HEMOGLOBIN: 16 g/dL (ref 13.0–17.0)
MCH: 29.7 pg (ref 26.0–34.0)
MCHC: 34.5 g/dL (ref 30.0–36.0)
MCV: 86.1 fL (ref 78.0–100.0)
Platelets: 278 10*3/uL (ref 150–400)
RBC: 5.39 MIL/uL (ref 4.22–5.81)
RDW: 13.9 % (ref 11.5–15.5)
WBC: 15.8 10*3/uL — ABNORMAL HIGH (ref 4.0–10.5)

## 2017-05-13 LAB — I-STAT TROPONIN, ED: Troponin i, poc: 0.02 ng/mL (ref 0.00–0.08)

## 2017-05-13 LAB — I-STAT CG4 LACTIC ACID, ED
LACTIC ACID, VENOUS: 4.48 mmol/L — AB (ref 0.5–1.9)
Lactic Acid, Venous: 5.59 mmol/L (ref 0.5–1.9)
Lactic Acid, Venous: 3.62 mmol/L (ref 0.5–1.9)

## 2017-05-13 LAB — GLUCOSE, CAPILLARY
Glucose-Capillary: 131 mg/dL — ABNORMAL HIGH (ref 65–99)
Glucose-Capillary: 168 mg/dL — ABNORMAL HIGH (ref 65–99)
Glucose-Capillary: 182 mg/dL — ABNORMAL HIGH (ref 65–99)

## 2017-05-13 LAB — I-STAT VENOUS BLOOD GAS, ED
Bicarbonate: 26.5 mmol/L (ref 20.0–28.0)
O2 Saturation: 33 %
TCO2: 28 mmol/L (ref 0–100)
pCO2, Ven: 48.7 mmHg (ref 44.0–60.0)
pH, Ven: 7.344 (ref 7.250–7.430)
pO2, Ven: 22 mmHg — CL (ref 32.0–45.0)

## 2017-05-13 LAB — BASIC METABOLIC PANEL
Anion gap: 11 (ref 5–15)
BUN: 47 mg/dL — AB (ref 6–20)
CHLORIDE: 103 mmol/L (ref 101–111)
CO2: 23 mmol/L (ref 22–32)
Calcium: 8.6 mg/dL — ABNORMAL LOW (ref 8.9–10.3)
Creatinine, Ser: 2.37 mg/dL — ABNORMAL HIGH (ref 0.61–1.24)
GFR calc Af Amer: 32 mL/min — ABNORMAL LOW (ref >60)
GFR calc non Af Amer: 28 mL/min — ABNORMAL LOW (ref >60)
GLUCOSE: 171 mg/dL — AB (ref 65–99)
POTASSIUM: 4.3 mmol/L (ref 3.5–5.1)
Sodium: 137 mmol/L (ref 135–145)

## 2017-05-13 LAB — CBG MONITORING, ED
GLUCOSE-CAPILLARY: 396 mg/dL — AB (ref 65–99)
GLUCOSE-CAPILLARY: 341 mg/dL — AB (ref 65–99)
Glucose-Capillary: 552 mg/dL (ref 65–99)
Glucose-Capillary: 271 mg/dL — ABNORMAL HIGH (ref 65–99)
Glucose-Capillary: 206 mg/dL — ABNORMAL HIGH (ref 65–99)
Glucose-Capillary: 158 mg/dL — ABNORMAL HIGH (ref 65–99)
Glucose-Capillary: 170 mg/dL — ABNORMAL HIGH (ref 65–99)

## 2017-05-13 LAB — PROTIME-INR
INR: 1.08
Prothrombin Time: 14 seconds (ref 11.4–15.2)

## 2017-05-13 LAB — OCCULT BLOOD GASTRIC / DUODENUM (SPECIMEN CUP): Occult Blood, Gastric: POSITIVE — AB

## 2017-05-13 LAB — PROCALCITONIN: PROCALCITONIN: 1.01 ng/mL

## 2017-05-13 LAB — APTT: APTT: 27 s (ref 24–36)

## 2017-05-13 LAB — LIPASE, BLOOD: Lipase: 27 U/L (ref 11–51)

## 2017-05-13 MED ORDER — HEPARIN SODIUM (PORCINE) 5000 UNIT/ML IJ SOLN
5000.0000 [IU] | Freq: Three times a day (TID) | INTRAMUSCULAR | Status: DC
  Administered 2017-05-13 – 2017-05-16 (×10): 5000 [IU] via SUBCUTANEOUS
  Filled 2017-05-13 (×10): qty 1

## 2017-05-13 MED ORDER — DEXTROSE-NACL 5-0.45 % IV SOLN: INTRAVENOUS | Status: DC

## 2017-05-13 MED ORDER — SODIUM CHLORIDE 0.9 % IV BOLUS (SEPSIS)
1000.0000 mL | Freq: Once | INTRAVENOUS | Status: AC
  Administered 2017-05-13: 1000 mL via INTRAVENOUS

## 2017-05-13 MED ORDER — PIPERACILLIN-TAZOBACTAM 3.375 G IVPB 30 MIN
3.3750 g | Freq: Once | INTRAVENOUS | Status: DC
  Administered 2017-05-13: 3.375 g via INTRAVENOUS
  Filled 2017-05-13: qty 50

## 2017-05-13 MED ORDER — SODIUM CHLORIDE 0.9 % IV SOLN
INTRAVENOUS | Status: DC
  Administered 2017-05-13: 2.9 [IU]/h via INTRAVENOUS
  Filled 2017-05-13: qty 1

## 2017-05-13 MED ORDER — ONDANSETRON 4 MG PO TBDP
4.0000 mg | ORAL_TABLET | Freq: Once | ORAL | Status: AC | PRN
  Administered 2017-05-13: 4 mg via ORAL

## 2017-05-13 MED ORDER — METOCLOPRAMIDE HCL 5 MG/ML IJ SOLN
5.0000 mg | Freq: Three times a day (TID) | INTRAMUSCULAR | Status: DC
  Administered 2017-05-13 – 2017-05-15 (×7): 5 mg via INTRAVENOUS
  Filled 2017-05-13 (×7): qty 2

## 2017-05-13 MED ORDER — METOPROLOL TARTRATE 5 MG/5ML IV SOLN
5.0000 mg | Freq: Four times a day (QID) | INTRAVENOUS | Status: DC
  Administered 2017-05-14 – 2017-05-16 (×9): 5 mg via INTRAVENOUS
  Filled 2017-05-13 (×12): qty 5

## 2017-05-13 MED ORDER — DEXTROSE-NACL 5-0.45 % IV SOLN
INTRAVENOUS | Status: DC
  Administered 2017-05-13: 75 mL/h via INTRAVENOUS
  Administered 2017-05-14: 05:00:00 via INTRAVENOUS

## 2017-05-13 MED ORDER — POTASSIUM CHLORIDE 10 MEQ/100ML IV SOLN
10.0000 meq | INTRAVENOUS | Status: AC
  Filled 2017-05-13: qty 100

## 2017-05-13 MED ORDER — SODIUM CHLORIDE 0.9 % IV SOLN
INTRAVENOUS | Status: DC
  Administered 2017-05-13: 4.9 [IU]/h via INTRAVENOUS
  Filled 2017-05-13: qty 1

## 2017-05-13 MED ORDER — SODIUM CHLORIDE 0.9 % IV SOLN
INTRAVENOUS | Status: DC
  Administered 2017-05-13: 125 mL/h via INTRAVENOUS

## 2017-05-13 MED ORDER — PIPERACILLIN-TAZOBACTAM 3.375 G IVPB: 3.3750 g | Freq: Three times a day (TID) | INTRAVENOUS | Status: DC

## 2017-05-13 MED ORDER — ONDANSETRON 4 MG PO TBDP
ORAL_TABLET | ORAL | Status: AC
  Administered 2017-05-13: 4 mg via ORAL
  Filled 2017-05-13: qty 1

## 2017-05-13 MED ORDER — SODIUM CHLORIDE 0.9 % IV SOLN: INTRAVENOUS | Status: DC

## 2017-05-13 MED ORDER — IOPAMIDOL (ISOVUE-300) INJECTION 61%
INTRAVENOUS | Status: AC
Start: 2017-05-13 — End: 2017-05-13
  Filled 2017-05-13: qty 30

## 2017-05-13 NOTE — Progress Notes (Signed)
Pharmacy Antibiotic Note  Jeremiah Bowman is a 61 y.o. male admitted on 05/13/2017 with sepsis.  Pharmacy has been consulted for Zosyn dosing.  Plan: Zosyn 3.375g IV q8h (4 hour infusion) starting at 2200 Follow c/s, clinical progression, renal function  Height: 5\' 8"  (172.7 cm) Weight: 215 lb (97.5 kg) IBW/kg (Calculated) : 68.4  Temp (24hrs), Avg:98.1 F (36.7 C), Min:98.1 F (36.7 C), Max:98.1 F (36.7 C)   Recent Labs Lab 05/13/17 1007 05/13/17 1156 05/13/17 1550  WBC 15.8*  --   --   CREATININE 2.61*  --   --   LATICACIDVEN  --  5.59* 4.48*    Estimated Creatinine Clearance: 33.6 mL/min (A) (by C-G formula based on SCr of 2.61 mg/dL (H)).    No Known Allergies  Antimicrobials this admission: Zosyn 6/28 >>   Dose adjustments this admission: n/a  Microbiology results: 6/28 BCx:   Thank you for allowing pharmacy to be a part of this patient's care.  Rayson Rando D. Elayna Tobler, PharmD, BCPS Clinical Pharmacist [phone] 05/13/2017 4:09 PM

## 2017-05-13 NOTE — ED Notes (Signed)
No suction was applied but pt has filled NG drainage canister.

## 2017-05-13 NOTE — ED Notes (Signed)
Patient transported to CT 

## 2017-05-13 NOTE — ED Notes (Signed)
Dr. Marily Memos contacted about 2 liter out in ng collection without sucction.Dr. Marily Memos states to clamp tube and reopen at 20008 pm

## 2017-05-13 NOTE — ED Notes (Signed)
Iv team at bedside  

## 2017-05-13 NOTE — ED Notes (Signed)
Karen NP at bedside 

## 2017-05-13 NOTE — Consult Note (Signed)
Reason for Consult:sbo Referring Physician: Dr Mikki Santee is an 61 y.o. male.  HPI: 61 yom from [place] with history of dm and htn not well managed it sounds like. For past four days he has diffuse abdominal pain and bloating.  This is associated with frequent urination and thirst.  He has not had flatus or bm in 2 days.  He has no fever. He has had n/v.  He has prior history in 2016 that resolved quickly and never had any further workup.  He has no history of endoscopy.Nothing was making pain better at home leading him to come to er. No radiation of pain.    Past Medical History:  Diagnosis Date  . Diabetes ([place])   . Hyperlipidemia   . Hypertension   . Small bowel obstruction ([place]) 10/2015    Past Surgical History:  Procedure Laterality Date  . TONSILLECTOMY     patient denies  . WRIST SURGERY      No family history on file.  Social History:  reports that he has quit smoking. He has never used smokeless tobacco. He reports that he drinks about 9.6 oz of alcohol per week . He reports that he does not use drugs.  Allergies: No Known Allergies  Medications: I have reviewed the patient's current medications.  Results for orders placed or performed during the hospital encounter of 05/13/17 (from the past 48 hour(s))  CBC     Status: Abnormal   Collection Time: 05/13/17 10:07 AM  Result Value Ref Range   WBC 15.8 (H) 4.0 - 10.5 K/uL   RBC 5.39 4.22 - 5.81 MIL/uL   Hemoglobin 16.0 13.0 - 17.0 g/dL   HCT 46.4 39.0 - 52.0 %   MCV 86.1 78.0 - 100.0 fL   MCH 29.7 26.0 - 34.0 pg   MCHC 34.5 30.0 - 36.0 g/dL   RDW 13.9 11.5 - 15.5 %   Platelets 278 150 - 400 K/uL  Lipase, blood     Status: None   Collection Time: 05/13/17 10:07 AM  Result Value Ref Range   Lipase 27 11 - 51 U/L  Comprehensive metabolic panel     Status: Abnormal   Collection Time: 05/13/17 10:07 AM  Result Value Ref Range   Sodium 134 (L) 135 - 145 mmol/L   Potassium 4.2 3.5 - 5.1 mmol/L   Chloride 91  (L) 101 - 111 mmol/L   CO2 22 22 - 32 mmol/L   Glucose, Bld 615 (HH) 65 - 99 mg/dL    Comment: CRITICAL RESULT CALLED TO, READ BACK BY AND VERIFIED WITH: GAGE,L RN 05/13/2017 1143 JORDANS    BUN 40 (H) 6 - 20 mg/dL   Creatinine, Ser 2.61 (H) 0.61 - 1.24 mg/dL   Calcium 9.9 8.9 - 10.3 mg/dL   Total Protein 7.8 6.5 - 8.1 g/dL   Albumin 4.2 3.5 - 5.0 g/dL   AST 29 15 - 41 U/L   ALT 30 17 - 63 U/L   Alkaline Phosphatase 78 38 - 126 U/L   Total Bilirubin 1.4 (H) 0.3 - 1.2 mg/dL   GFR calc non Af Amer 25 (L) >60 mL/min   GFR calc Af Amer 29 (L) >60 mL/min    Comment: (NOTE) The eGFR has been calculated using the CKD EPI equation. This calculation has not been validated in all clinical situations. eGFR's persistently <60 mL/min signify possible Chronic Kidney Disease.    Anion gap 21 (H) 5 - 15    Comment:  RESULT CHECKED  I-stat troponin, ED     Status: None   Collection Time: 05/13/17 10:54 AM  Result Value Ref Range   Troponin i, poc 0.02 0.00 - 0.08 ng/mL   Comment 3            Comment: Due to the release kinetics of cTnI, a negative result within the first hours of the onset of symptoms does not rule out myocardial infarction with certainty. If myocardial infarction is still suspected, repeat the test at appropriate intervals.   I-Stat CG4 Lactic Acid, ED     Status: Abnormal   Collection Time: 05/13/17 11:56 AM  Result Value Ref Range   Lactic Acid, Venous 5.59 (HH) 0.5 - 1.9 mmol/L   Comment NOTIFIED PHYSICIAN   CBG monitoring, ED     Status: Abnormal   Collection Time: 05/13/17 12:45 PM  Result Value Ref Range   Glucose-Capillary 552 (HH) 65 - 99 mg/dL  CBG monitoring, ED     Status: Abnormal   Collection Time: 05/13/17  2:06 PM  Result Value Ref Range   Glucose-Capillary 396 (H) 65 - 99 mg/dL  CBG monitoring, ED     Status: Abnormal   Collection Time: 05/13/17  3:19 PM  Result Value Ref Range   Glucose-Capillary 341 (H) 65 - 99 mg/dL  I-Stat CG4 Lactic Acid, ED      Status: Abnormal   Collection Time: 05/13/17  3:50 PM  Result Value Ref Range   Lactic Acid, Venous 4.48 (HH) 0.5 - 1.9 mmol/L   Comment NOTIFIED PHYSICIAN   I-Stat venous blood gas, ED     Status: Abnormal   Collection Time: 05/13/17  3:51 PM  Result Value Ref Range   pH, Ven 7.344 7.250 - 7.430   pCO2, Ven 48.7 44.0 - 60.0 mmHg   pO2, Ven 22.0 (LL) 32.0 - 45.0 mmHg   Bicarbonate 26.5 20.0 - 28.0 mmol/L   TCO2 28 0 - 100 mmol/L   O2 Saturation 33.0 %   Patient temperature HIDE    Sample type VENOUS   CBG monitoring, ED     Status: Abnormal   Collection Time: 05/13/17  4:27 PM  Result Value Ref Range   Glucose-Capillary 271 (H) 65 - 99 mg/dL    Ct Abdomen Pelvis Wo Contrast  Result Date: 05/13/2017 CLINICAL DATA:  Diffuse abdominal pain for several days EXAM: CT ABDOMEN AND PELVIS WITHOUT CONTRAST TECHNIQUE: Multidetector CT imaging of the abdomen and pelvis was performed following the standard protocol without IV contrast. COMPARISON:  10/24/2015 FINDINGS: Lower chest: Mild scarring is noted bilaterally. No focal infiltrate is seen. Small nodule is noted along the major fissure on the left stable from the prior exam. Some other previously seen nodules are not as well appreciated on today's exam. Hepatobiliary: The liver is within normal limits. The gallbladder is unremarkable. Pancreas: Unremarkable. No pancreatic ductal dilatation or surrounding inflammatory changes. Spleen: Normal in size without focal abnormality. Adrenals/Urinary Tract: Fullness of the left adrenal gland is again seen and stable. The right adrenal gland is unremarkable. The kidneys demonstrate no renal calculi or obstructive changes. The bladder is partially distended. Stomach/Bowel: The stomach is significantly distended with fluid and food stuffs. This continues into the proximal small bowel with significant distention and fecalization of small bowel contents in the distal jejunum/ proximal ileum. The distal ileum  is decompressed. The colon shows diverticular change. The appendix is within normal limits. A definitive transition zone is not appreciated similar to that noted  on prior exam but lies deep within the pelvis. No definitive mass lesion is seen. Vascular/Lymphatic: No significant vascular findings are present. No enlarged abdominal or pelvic lymph nodes. Reproductive: Prostate is unremarkable. Other: No abdominal wall hernia or abnormality. No abdominopelvic ascites. Musculoskeletal: No acute or significant osseous findings. IMPRESSION: 1. Significant dilatation of the stomach, jejunum and likely proximal ileum related to an area within the pelvis seen on images 73-76 of series 3. This is consistent with a partial small bowel obstruction. The actual transition zone is not well appreciated. The overall appearance is similar to that seen on the prior exam although the degree of small bowel dilatation is increased. 2. Diverticulosis without diverticulitis. 3. Previously seen nodular densities in the lung bases are not as well appreciated Electronically Signed   By: Inez Catalina M.D.   On: 05/13/2017 15:06   Dg Chest 2 View  Result Date: 05/13/2017 CLINICAL DATA:  States developed nausea and emesis denies diarrhea one day ago and continued today with chest burning and abdominal pain. Pt also complaining of left shoulder pain radiating into his lower back. EXAM: CHEST  2 VIEW COMPARISON:  10/24/2015 FINDINGS: The heart size and mediastinal contours are within normal limits. Both lungs are clear. The visualized skeletal structures are unremarkable. IMPRESSION: No active cardiopulmonary disease. Electronically Signed   By: Kathreen Devoid   On: 05/13/2017 10:24    Review of Systems  Constitutional: Negative for chills and fever.  Respiratory: Negative for cough.   Cardiovascular: Negative for chest pain.  Gastrointestinal: Positive for abdominal pain, constipation, nausea and vomiting.  All other systems reviewed  and are negative.  Blood pressure (!) 137/104, pulse (!) 109, temperature 98.1 F (36.7 C), temperature source Oral, resp. rate (!) 36, height _0  (1.727 m), weight 97.5 kg (215 lb), SpO2 100 %. Physical Exam  Vitals reviewed. Constitutional: He is oriented to person, place, and time. He appears well-developed and well-nourished.  HENT:  Head: Normocephalic and atraumatic.  Right Ear: External ear normal.  Left Ear: External ear normal.  Mouth/Throat: Oropharynx is clear and moist.  Eyes: EOM are normal. Pupils are equal, round, and reactive to light.  Neck: Neck supple.  Cardiovascular: Normal rate, regular rhythm, normal heart sounds and intact distal pulses.   Respiratory: Effort normal and breath sounds normal. He has no wheezes.  GI: He exhibits distension. Bowel sounds are decreased. There is no tenderness. No hernia.  Musculoskeletal: Normal range of motion. He exhibits no edema or tenderness.  Lymphadenopathy:    He has no cervical adenopathy.  Neurological: He is alert and oriented to person, place, and time.  Skin: Skin is warm and dry.    Assessment/Plan: Likely sbo vs ileus  He does not have prior abdominal surgery and has episode before. Not sure if these are ileus related to dka or actual sbo.  There is not transition zone I see on ct scan but certainly has dilated proximal stomach and bowel. He has elevated lactate etc consistent with dka at this time. His abdomen is not tender and I do not think he needs surgery. Will have ng tube placed and do small bowel protocol.  I discussed with him that if this resolves quickly with dka likely no surgery but if not better soon due to fact he has no prior abdominal surgery may need surgery. I discussed this plan with the patient.  I do not think he needs abx for any intraabdominal infection right now.   Valoree Agent 05/13/2017,  4:31 PM

## 2017-05-13 NOTE — ED Provider Notes (Signed)
[facility] DEPT Provider Note   CSN: [id number] Arrival date & time: 05/13/17  3818     History   Chief Complaint Chief Complaint  Patient presents with  . Chest Pain  . Shortness of Breath  . Abdominal Pain    HPI Jeremiah Bowman is a 61 y.o. male.  HPI Patient presents to the emergency department with nausea vomiting along with these abdominal pain and distention.  He states she has also had intermittent burning in his chest.  Patient states that this also makes him have some shortness of breath as well.  The patient states that he does not check his blood sugars regularly.  Patient states that palpation of the abdomen seems to make the discomfort worse.  The patient states that he has been having excessive thirst.  Patient states that nothing seems make the condition better or worse. The patient denies  headache,blurred vision, neck pain, fever, cough, weakness, numbness, dizziness, anorexia, edema, abdominal pain, nausea, vomiting, diarrhea, rash, back pain, dysuria, hematemesis, bloody stool, near syncope, or syncope. Past Medical History:  Diagnosis Date  . Diabetes ([place])   . Hyperlipidemia   . Hypertension   . Small bowel obstruction ([place]) 10/2015    Patient Active Problem List   Diagnosis Date Noted  . Partial small bowel obstruction ([place])   . SBO (small bowel obstruction) ([place])   . Abdominal pain 10/24/2015  . Nausea and vomiting 10/24/2015  . Small bowel obstruction ([place]) 10/24/2015  . Severe sepsis ([place]) 10/24/2015  . Alcohol dependency ([place]) 10/24/2015  . Diabetes mellitus ([place]) 10/24/2015  . HTN (hypertension) 10/24/2015  . Hyperlipidemia 10/24/2015  . Acute urinary retention 10/24/2015  . Acute renal failure (ARF) ([place]) 10/24/2015  . Sepsis ([place]) 10/24/2015  . Acute kidney injury ([place])   . Lactic acidosis     Past Surgical History:  Procedure Laterality Date  . TONSILLECTOMY     patient denies  . WRIST SURGERY         Home Medications     Prior to Admission medications   Medication Sig Start Date End Date Taking? Authorizing Provider  aspirin 81 MG chewable tablet Chew 81 mg by mouth daily.   Yes [provider]  cyclobenzaprine (FLEXERIL) 5 MG tablet Take 5 mg by mouth 3 (three) times daily as needed for muscle spasms.   Yes [provider]  lidocaine (ASPERCREME W/LIDOCAINE) 4 % cream Apply 1 application topically daily as needed (back pain).   Yes [provider]  lisinopril-hydrochlorothiazide (PRINZIDE,ZESTORETIC) 20-12.5 MG tablet Take 1 tablet by mouth 2 (two) times daily.   Yes [provider]  metFORMIN (GLUCOPHAGE) 1000 MG tablet Take 1,000 mg by mouth 2 (two) times daily with a meal.   Yes [provider]  naproxen (NAPROSYN) 500 MG tablet Take 500 mg by mouth 2 (two) times daily with a meal.   Yes [provider]  oxybutynin (DITROPAN-XL) 5 MG 24 hr tablet Take 5 mg by mouth 2 (two) times daily.   Yes [provider]  pravastatin (PRAVACHOL) 20 MG tablet Take 20 mg by mouth daily.   Yes [provider]  ranitidine (ZANTAC) 150 MG tablet Take 150 mg by mouth daily as needed for heartburn.   Yes [provider]  verapamil (CALAN-SR) 180 MG CR tablet Take 180 mg by mouth daily.   Yes [provider]    Family History No family history on file.  Social History Social History  Substance Use Topics  .  Smoking status: Former Research scientist (life sciences)  . Smokeless tobacco: Never Used  . Alcohol use 9.6 oz/week    2 Cans of beer, 14 Shots of liquor per week     Allergies   Patient has no known allergies.   Review of Systems Review of Systems  All other systems negative except as documented in the HPI. All pertinent positives and negatives as reviewed in the HPI.= Physical Exam Updated Vital Signs BP 127/90   Pulse (!) 110   Temp 98.1 F (36.7 C) (Oral)   Resp (!) 30   Wt 97.5 kg (215 lb)   SpO2 100%   BMI 32.69 kg/m   Physical  Exam  Constitutional: He is oriented to person, place, and time. He appears well-developed and well-nourished. No distress.  HENT:  Head: Normocephalic and atraumatic.  Mouth/Throat: Mucous membranes are dry.  Eyes: Pupils are equal, round, and reactive to light.  Neck: Normal range of motion. Neck supple.  Cardiovascular: Normal rate, regular rhythm and normal heart sounds.  Exam reveals no gallop and no friction rub.   No murmur heard. Pulmonary/Chest: Effort normal and breath sounds normal. No respiratory distress. He has no wheezes. He exhibits no tenderness.  Abdominal: Soft. Bowel sounds are normal. He exhibits distension. He exhibits no mass. There is tenderness. There is no rebound and no guarding.  Neurological: He is alert and oriented to person, place, and time. He exhibits normal muscle tone. Coordination normal.  Skin: Skin is warm and dry. Capillary refill takes less than 2 seconds. No rash noted. No erythema.  Psychiatric: He has a normal mood and affect. His behavior is normal.  Nursing note and vitals reviewed.    ED Treatments / Results  Labs (all labs ordered are listed, but only abnormal results are displayed) Labs Reviewed  CBC - Abnormal; Notable for the following:       Result Value   WBC 15.8 (*)    All other components within normal limits  COMPREHENSIVE METABOLIC PANEL - Abnormal; Notable for the following:    Sodium 134 (*)    Chloride 91 (*)    Glucose, Bld 615 (*)    BUN 40 (*)    Creatinine, Ser 2.61 (*)    Total Bilirubin 1.4 (*)    GFR calc non Af Amer 25 (*)    GFR calc Af Amer 29 (*)    Anion gap 21 (*)    All other components within normal limits  I-STAT CG4 LACTIC ACID, ED - Abnormal; Notable for the following:    Lactic Acid, Venous 5.59 (*)    All other components within normal limits  CBG MONITORING, ED - Abnormal; Notable for the following:    Glucose-Capillary 552 (*)    All other components within normal limits  CBG MONITORING, ED -  Abnormal; Notable for the following:    Glucose-Capillary 396 (*)    All other components within normal limits  CBG MONITORING, ED - Abnormal; Notable for the following:    Glucose-Capillary 341 (*)    All other components within normal limits  LIPASE, BLOOD  URINALYSIS, ROUTINE W REFLEX MICROSCOPIC  I-STAT TROPOININ, ED  I-STAT VENOUS BLOOD GAS, ED  I-STAT CG4 LACTIC ACID, ED    EKG  EKG Interpretation  Date/Time:  Thursday May 13 2017 09:40:37 EDT Ventricular Rate:  123 PR Interval:  156 QRS Duration: 64 QT Interval:  312 QTC Calculation: 446 R Axis:   66 Text Interpretation:  Sinus tachycardia with occasional Premature  ventricular complexes Cannot rule out Anterior infarct , age undetermined Abnormal ECG When compared to prior, no significant changes.  No STEMI Confirmed by Antony Blackbird [phone]) on 05/13/2017 12:18:12 PM       Radiology Ct Abdomen Pelvis Wo Contrast  Result Date: 05/13/2017 CLINICAL DATA:  Diffuse abdominal pain for several days EXAM: CT ABDOMEN AND PELVIS WITHOUT CONTRAST TECHNIQUE: Multidetector CT imaging of the abdomen and pelvis was performed following the standard protocol without IV contrast. COMPARISON:  10/24/2015 FINDINGS: Lower chest: Mild scarring is noted bilaterally. No focal infiltrate is seen. Small nodule is noted along the major fissure on the left stable from the prior exam. Some other previously seen nodules are not as well appreciated on today's exam. Hepatobiliary: The liver is within normal limits. The gallbladder is unremarkable. Pancreas: Unremarkable. No pancreatic ductal dilatation or surrounding inflammatory changes. Spleen: Normal in size without focal abnormality. Adrenals/Urinary Tract: Fullness of the left adrenal gland is again seen and stable. The right adrenal gland is unremarkable. The kidneys demonstrate no renal calculi or obstructive changes. The bladder is partially distended. Stomach/Bowel: The stomach is significantly  distended with fluid and food stuffs. This continues into the proximal small bowel with significant distention and fecalization of small bowel contents in the distal jejunum/ proximal ileum. The distal ileum is decompressed. The colon shows diverticular change. The appendix is within normal limits. A definitive transition zone is not appreciated similar to that noted on prior exam but lies deep within the pelvis. No definitive mass lesion is seen. Vascular/Lymphatic: No significant vascular findings are present. No enlarged abdominal or pelvic lymph nodes. Reproductive: Prostate is unremarkable. Other: No abdominal wall hernia or abnormality. No abdominopelvic ascites. Musculoskeletal: No acute or significant osseous findings. IMPRESSION: 1. Significant dilatation of the stomach, jejunum and likely proximal ileum related to an area within the pelvis seen on images 73-76 of series 3. This is consistent with a partial small bowel obstruction. The actual transition zone is not well appreciated. The overall appearance is similar to that seen on the prior exam although the degree of small bowel dilatation is increased. 2. Diverticulosis without diverticulitis. 3. Previously seen nodular densities in the lung bases are not as well appreciated Electronically Signed   By: Inez Catalina M.D.   On: 05/13/2017 15:06   Dg Chest 2 View  Result Date: 05/13/2017 CLINICAL DATA:  States developed nausea and emesis denies diarrhea one day ago and continued today with chest burning and abdominal pain. Pt also complaining of left shoulder pain radiating into his lower back. EXAM: CHEST  2 VIEW COMPARISON:  10/24/2015 FINDINGS: The heart size and mediastinal contours are within normal limits. Both lungs are clear. The visualized skeletal structures are unremarkable. IMPRESSION: No active cardiopulmonary disease. Electronically Signed   By: Kathreen Devoid   On: 05/13/2017 10:24    Procedures Procedures (including critical care  time)  Medications Ordered in ED Medications  iopamidol (ISOVUE-300) 61 % injection (not administered)  dextrose 5 %-0.45 % sodium chloride infusion (not administered)  insulin regular (NOVOLIN R,HUMULIN R) 100 Units in sodium chloride 0.9 % 100 mL (1 Units/mL) infusion (5.6 Units/hr Intravenous Rate/Dose Change 05/13/17 1522)  sodium chloride 0.9 % bolus 1,000 mL (1,000 mLs Intravenous New Bag/Given 05/13/17 1427)    And  sodium chloride 0.9 % bolus 1,000 mL (0 mLs Intravenous Stopped 05/13/17 1427)    And  0.9 %  sodium chloride infusion (not administered)  ondansetron (ZOFRAN-ODT) disintegrating tablet 4 mg (4 mg Oral  Given 05/13/17 0950)  sodium chloride 0.9 % bolus 1,000 mL (0 mLs Intravenous Stopped 05/13/17 1313)     Initial Impression / Assessment and Plan / ED Course  I have reviewed the triage vital signs and the nursing notes.  Pertinent labs & imaging results that were available during my care of the patient were reviewed by me and considered in my medical decision making (see chart for details).     The patient appears to be dehydrated from the vomiting along with elevated blood sugars at this point he does not appear to be in DKA.  The patient's CT scan does show what appears to be a partial small bowel obstruction.  The patient's tachycardia has improved with fluids.  Patient will need admission for the dehydration along with elevated blood sugars and partial small bowel obstruction.   Final Clinical Impressions(s) / ED Diagnoses   Final diagnoses:  Pain in the abdomen    New Prescriptions New Prescriptions   No medications on file     Dalia Heading, Hershal Coria 05/13/17 [place], Gwenyth Allegra, MD 05/13/17 [phone]

## 2017-05-13 NOTE — ED Triage Notes (Signed)
States developed nausea and emesis denies diarrhea one day ago and continued today with chest burning and abdominal pain.

## 2017-05-13 NOTE — ED Notes (Signed)
IV attempted x 3 by 2 nurses without success.

## 2017-05-13 NOTE — ED Notes (Addendum)
Dr Marily Memos spoke with [facility] RN about clamping the NG tube until 8. Also spoke to him about giving heparin he wants to test the gastric content for blood then consider to give heparin.

## 2017-05-13 NOTE — H&P (Signed)
History and Physical    Jeremiah Bowman ENI:[id number] DOB: December 29, 1955 DOA: 05/13/2017  PCP: Everardo Beals, NP Patient coming from: home  Chief Complaint: Abdominal pain/nausea and vomiting  HPI: Jeremiah Bowman is a very pleasant 61 y.o. male [place] his lived here for 20 years with medical history significant for diabetes, hypertension, small bowel obstruction presents to the emergency department with chief complaint 4 day history of persistent abdominal pain nausea and vomiting. Initial evaluation includes CT of the abdomen and pelvis concerning for partial small bowel obstruction and also reveals DKA  Information is obtained from the patient. He states he was in his usual state of health until about 4 days ago he developed gradual worsening nausea and vomiting. Associated symptoms include worsening distention, constipation shortness of breath and reflux. He also reports increased thirst and urination. He states he takes metformin for diabetes but admits to not managing/monitoring his blood sugar. He denies headache dizziness syncope or near-syncope. He denies chest pain palpitations lower extremity edema dysuria hematuria frequency or urgency. He denies fever chills cough.    ED Course: In the emergency department he is provided with IV fluids and insulin drip was initiated. He is hemodynamically stable and not hypoxic.  Review of Systems: As per HPI otherwise all other systems reviewed and are negative.   Ambulatory Status: He relates independently is independent with ADLs  Past Medical History:  Diagnosis Date  . Acute kidney injury ([place])   . Diabetes ([place])   . DKA (diabetic ketoacidoses) ([place])   . Hyperlipidemia   . Hypertension   . Small bowel obstruction ([place]) 10/2015    Past Surgical History:  Procedure Laterality Date  . TONSILLECTOMY     patient denies  . WRIST SURGERY      Social History   Social History  . Marital status: Married    Spouse name: N/A  . Number of  children: N/A  . Years of education: N/A   Occupational History  . Not on file.   Social History Main Topics  . Smoking status: Former Research scientist (life sciences)  . Smokeless tobacco: Never Used  . Alcohol use 9.6 oz/week    2 Cans of beer, 14 Shots of liquor per week  . Drug use: No  . Sexual activity: Not on file   Other Topics Concern  . Not on file   Social History Narrative  . No narrative on file    No Known Allergies  Family History  Problem Relation Age of Onset  . Hypertension Mother   . Hypertension Father     Prior to Admission medications   Medication Sig Start Date End Date Taking? Authorizing Provider  aspirin 81 MG chewable tablet Chew 81 mg by mouth daily.   Yes [provider]  cyclobenzaprine (FLEXERIL) 5 MG tablet Take 5 mg by mouth 3 (three) times daily as needed for muscle spasms.   Yes [provider]  lidocaine (ASPERCREME W/LIDOCAINE) 4 % cream Apply 1 application topically daily as needed (back pain).   Yes [provider]  lisinopril-hydrochlorothiazide (PRINZIDE,ZESTORETIC) 20-12.5 MG tablet Take 1 tablet by mouth 2 (two) times daily.   Yes [provider]  metFORMIN (GLUCOPHAGE) 1000 MG tablet Take 1,000 mg by mouth 2 (two) times daily with a meal.   Yes [provider]  naproxen (NAPROSYN) 500 MG tablet Take 500 mg by mouth 2 (two) times daily with a meal.   Yes [provider]  oxybutynin (DITROPAN-XL) 5 MG 24 hr tablet Take 5  mg by mouth 2 (two) times daily.   Yes [provider]  pravastatin (PRAVACHOL) 20 MG tablet Take 20 mg by mouth daily.   Yes [provider]  ranitidine (ZANTAC) 150 MG tablet Take 150 mg by mouth daily as needed for heartburn.   Yes [provider]  verapamil (CALAN-SR) 180 MG CR tablet Take 180 mg by mouth daily.   Yes [provider]    Physical Exam: Vitals:   05/13/17 1500 05/13/17 1515 05/13/17 1545 05/13/17 1600  BP: (!) 129/115 127/90 (!)  137/104   Pulse: (!) 109 (!) 110 (!) 109   Resp:  (!) 30 (!) 36   Temp:      TempSrc:      SpO2: 98% 100% 100%   Weight:      Height:    5\' 8"  (1.727 m)     General:  Appears Acutely ill and slightly uncomfortable. Eyes:  PERRL, EOMI, normal lids, iris ENT:  grossly normal hearing, lips & tongue, mucous membranes of his mouth are pink but dry Neck:  no LAD, masses or thyromegaly Cardiovascular:  Tachycardic but regular, no m/r/g. No LE edema.  Respiratory:  Mild increased work of breathing sounds somewhat distant I hear no wheeze no crackles Abdomen:  Quite distended with diminished bowel sounds no guarding or rebounding Skin:  no rash or induration seen on limited exam Musculoskeletal:  grossly normal tone BUE/BLE, good ROM, no bony abnormality Psychiatric:  grossly normal mood and affect, speech fluent and appropriate, AOx3 Neurologic:  CN 2-12 grossly intact, moves all extremities in coordinated fashion, sensation intact  Labs on Admission: I have personally reviewed following labs and imaging studies  CBC:  Recent Labs Lab 05/13/17 1007  WBC 15.8*  HGB 16.0  HCT 46.4  MCV 86.1  PLT 382   Basic Metabolic Panel:  Recent Labs Lab 05/13/17 1007  NA 134*  K 4.2  CL 91*  CO2 22  GLUCOSE 615*  BUN 40*  CREATININE 2.61*  CALCIUM 9.9   GFR: Estimated Creatinine Clearance: 33.6 mL/min (A) (by C-G formula based on SCr of 2.61 mg/dL (H)). Liver Function Tests:  Recent Labs Lab 05/13/17 1007  AST 29  ALT 30  ALKPHOS 78  BILITOT 1.4*  PROT 7.8  ALBUMIN 4.2    Recent Labs Lab 05/13/17 1007  LIPASE 27   No results for input(s): AMMONIA in the last 168 hours. Coagulation Profile: No results for input(s): INR, PROTIME in the last 168 hours. Cardiac Enzymes: No results for input(s): CKTOTAL, CKMB, CKMBINDEX, TROPONINI in the last 168 hours. BNP (last 3 results) No results for input(s): PROBNP in the last 8760 hours. HbA1C: No results for input(s): HGBA1C  in the last 72 hours. CBG:  Recent Labs Lab 05/13/17 1245 05/13/17 1406 05/13/17 1519 05/13/17 1627  GLUCAP 552* 396* 341* 271*   Lipid Profile: No results for input(s): CHOL, HDL, LDLCALC, TRIG, CHOLHDL, LDLDIRECT in the last 72 hours. Thyroid Function Tests: No results for input(s): TSH, T4TOTAL, FREET4, T3FREE, THYROIDAB in the last 72 hours. Anemia Panel: No results for input(s): VITAMINB12, FOLATE, FERRITIN, TIBC, IRON, RETICCTPCT in the last 72 hours. Urine analysis:    Component Value Date/Time   COLORURINE YELLOW 05/13/2017 1608   APPEARANCEUR CLOUDY (A) 05/13/2017 1608   LABSPEC 1.013 05/13/2017 1608   PHURINE 5.0 05/13/2017 1608   GLUCOSEU >=500 (A) 05/13/2017 1608   HGBUR SMALL (A) 05/13/2017 1608   BILIRUBINUR NEGATIVE 05/13/2017 1608   KETONESUR NEGATIVE 05/13/2017  [place] (A) 05/13/2017 1608   NITRITE NEGATIVE 05/13/2017 1608   LEUKOCYTESUR TRACE (A) 05/13/2017 1608    Creatinine Clearance: Estimated Creatinine Clearance: 33.6 mL/min (A) (by C-G formula based on SCr of 2.61 mg/dL (H)).  Sepsis Labs: @LABRCNTIP (procalcitonin:4,lacticidven:4) )No results found for this or any previous visit (from the past 240 hour(s)).   Radiological Exams on Admission: Ct Abdomen Pelvis Wo Contrast  Result Date: 05/13/2017 CLINICAL DATA:  Diffuse abdominal pain for several days EXAM: CT ABDOMEN AND PELVIS WITHOUT CONTRAST TECHNIQUE: Multidetector CT imaging of the abdomen and pelvis was performed following the standard protocol without IV contrast. COMPARISON:  10/24/2015 FINDINGS: Lower chest: Mild scarring is noted bilaterally. No focal infiltrate is seen. Small nodule is noted along the major fissure on the left stable from the prior exam. Some other previously seen nodules are not as well appreciated on today's exam. Hepatobiliary: The liver is within normal limits. The gallbladder is unremarkable. Pancreas: Unremarkable. No pancreatic ductal dilatation or  surrounding inflammatory changes. Spleen: Normal in size without focal abnormality. Adrenals/Urinary Tract: Fullness of the left adrenal gland is again seen and stable. The right adrenal gland is unremarkable. The kidneys demonstrate no renal calculi or obstructive changes. The bladder is partially distended. Stomach/Bowel: The stomach is significantly distended with fluid and food stuffs. This continues into the proximal small bowel with significant distention and fecalization of small bowel contents in the distal jejunum/ proximal ileum. The distal ileum is decompressed. The colon shows diverticular change. The appendix is within normal limits. A definitive transition zone is not appreciated similar to that noted on prior exam but lies deep within the pelvis. No definitive mass lesion is seen. Vascular/Lymphatic: No significant vascular findings are present. No enlarged abdominal or pelvic lymph nodes. Reproductive: Prostate is unremarkable. Other: No abdominal wall hernia or abnormality. No abdominopelvic ascites. Musculoskeletal: No acute or significant osseous findings. IMPRESSION: 1. Significant dilatation of the stomach, jejunum and likely proximal ileum related to an area within the pelvis seen on images 73-76 of series 3. This is consistent with a partial small bowel obstruction. The actual transition zone is not well appreciated. The overall appearance is similar to that seen on the prior exam although the degree of small bowel dilatation is increased. 2. Diverticulosis without diverticulitis. 3. Previously seen nodular densities in the lung bases are not as well appreciated Electronically Signed   By: Inez Catalina M.D.   On: 05/13/2017 15:06   Dg Chest 2 View  Result Date: 05/13/2017 CLINICAL DATA:  States developed nausea and emesis denies diarrhea one day ago and continued today with chest burning and abdominal pain. Pt also complaining of left shoulder pain radiating into his lower back. EXAM:  CHEST  2 VIEW COMPARISON:  10/24/2015 FINDINGS: The heart size and mediastinal contours are within normal limits. Both lungs are clear. The visualized skeletal structures are unremarkable. IMPRESSION: No active cardiopulmonary disease. Electronically Signed   By: Kathreen Devoid   On: 05/13/2017 10:24    EKG: Independently reviewed. Sinus tachycardia with occasional Premature ventricular complexes Cannot rule out Anterior infarct , age undetermined Abnormal ECG When compared to prior, no significant changes.No STEMI  Assessment/Plan Principal Problem:   Sepsis ([place]) Active Problems:   Abdominal pain   Nausea and vomiting   Diabetes mellitus ([facility])   HTN (hypertension)   Hyperlipidemia   Acute kidney injury ([place])   Lactic acidosis   Partial small bowel obstruction ([facility])   DKA (diabetic ketoacidoses) ([place])   #  1. Sepsis. Patient meets criteria however at this point doubt true infectious process. He is tachycardic with an elevated lactic acid and leukocytosis tachypnea likely related to dehydration and DKA. He is afebrile. Chest xray without infiltrate, urinalysis with protein and glucose. lactic acid trending down on admission  He is provided with vigorous IV fluids and insulin drip initiated -Admit to step down -No antibiotics at this time -Continue vigorous IV fluids -Continue insulin drip -Follow blood cultures -Track lactic acid -follow procalcitonin  #2. DKA. Serum glucose 615 on admission within an ion gap of 21. Home medications include metformin. I'm not sure he takes this consistently. He admits to not monitoring his blood sugar. -Continue insulin drip per "command or protocol -Basic metabolic panel every 4 hours -Transition to subcutaneous insulin once gap closed -Nothing by mouth for now -Diabetes coordinator consult -Obtain hemoglobin A1c  #3. Small bowel obstruction per imaging as noted above. 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 No bowel movement or flatus for  2 days. Abdomen quite distended and tight but nontender. Evaluated by general surgery who opined that if etiology is to DKA should resolve quickly if not patient may need surgery. Of note patient with admission December 2016 with similar presentation. -NG tube -Nothing by mouth -Reglan -Patient a general surgery input -Once resolved may benefit from GI consult for work up  #4. Acute kidney injury. Likely related to dehydration secondary to #2. In the setting of lisinopril and hydrochlorothiazide.  Creatinine 2.0 on admission. -Continue IV fluids -Hold nephrotoxins -Monitor urine output -Patient with a history of urinary retention so would have low threshold for foley if needed  #5. Diabetes. See #2. Home medications include metformin. He appears to be quite noncompliant but I'm not sure why. He states he has a primary care provider and saw this person 6 months ago -Diabetes coordinator -Case management for PCP if needed -See #2     DVT prophylaxis: heparin Code Status: full  Family Communication: none present  Disposition Plan: home  Consults called: [facility]  Admission status: inpatient    Radene Gunning MD [facility]  If 7PM-7AM, please contact night-coverage [email or web address] Password [email or web address]  05/13/2017, 5:00 PM

## 2017-05-13 NOTE — ED Notes (Signed)
PT did not have to use RR at this time  

## 2017-05-14 ENCOUNTER — Inpatient Hospital Stay (HOSPITAL_COMMUNITY): Payer: Self-pay

## 2017-05-14 DIAGNOSIS — K56609 Unspecified intestinal obstruction, unspecified as to partial versus complete obstruction: Secondary | ICD-10-CM

## 2017-05-14 DIAGNOSIS — E0810 Diabetes mellitus due to underlying condition with ketoacidosis without coma: Secondary | ICD-10-CM

## 2017-05-14 DIAGNOSIS — I10 Essential (primary) hypertension: Secondary | ICD-10-CM

## 2017-05-14 DIAGNOSIS — N171 Acute kidney failure with acute cortical necrosis: Secondary | ICD-10-CM

## 2017-05-14 DIAGNOSIS — E784 Other hyperlipidemia: Secondary | ICD-10-CM

## 2017-05-14 DIAGNOSIS — E1165 Type 2 diabetes mellitus with hyperglycemia: Secondary | ICD-10-CM

## 2017-05-14 LAB — BASIC METABOLIC PANEL
ANION GAP: 13 (ref 5–15)
ANION GAP: 8 (ref 5–15)
BUN: 43 mg/dL — ABNORMAL HIGH (ref 6–20)
BUN: 33 mg/dL — ABNORMAL HIGH (ref 6–20)
CALCIUM: 9 mg/dL (ref 8.9–10.3)
CALCIUM: 9.1 mg/dL (ref 8.9–10.3)
CO2: 23 mmol/L (ref 22–32)
CO2: 25 mmol/L (ref 22–32)
Chloride: 103 mmol/L (ref 101–111)
Chloride: 105 mmol/L (ref 101–111)
Creatinine, Ser: 1.86 mg/dL — ABNORMAL HIGH (ref 0.61–1.24)
Creatinine, Ser: 1.19 mg/dL (ref 0.61–1.24)
GFR, EST AFRICAN AMERICAN: 43 mL/min — AB (ref >60)
GFR, EST NON AFRICAN AMERICAN: 37 mL/min — AB (ref >60)
GLUCOSE: 239 mg/dL — AB (ref 65–99)
GLUCOSE: 183 mg/dL — AB (ref 65–99)
POTASSIUM: 3.6 mmol/L (ref 3.5–5.1)
Potassium: 3.3 mmol/L — ABNORMAL LOW (ref 3.5–5.1)
SODIUM: 138 mmol/L (ref 135–145)
Sodium: 139 mmol/L (ref 135–145)

## 2017-05-14 LAB — LIPID PANEL
CHOLESTEROL: 119 mg/dL (ref 0–200)
Cholesterol: 120 mg/dL (ref 0–200)
HDL: 38 mg/dL — ABNORMAL LOW (ref >40)
HDL: 39 mg/dL — ABNORMAL LOW (ref >40)
LDL CALC: 51 mg/dL (ref 0–99)
LDL CALC: 51 mg/dL (ref 0–99)
Total CHOL/HDL Ratio: 3.2 RATIO
Total CHOL/HDL Ratio: 3.1 RATIO
Triglycerides: 156 mg/dL — ABNORMAL HIGH (ref <150)
Triglycerides: 145 mg/dL (ref <150)
VLDL: 31 mg/dL (ref 0–40)
VLDL: 29 mg/dL (ref 0–40)

## 2017-05-14 LAB — GLUCOSE, CAPILLARY
GLUCOSE-CAPILLARY: 135 mg/dL — AB (ref 65–99)
GLUCOSE-CAPILLARY: 147 mg/dL — AB (ref 65–99)
GLUCOSE-CAPILLARY: 264 mg/dL — AB (ref 65–99)
GLUCOSE-CAPILLARY: 175 mg/dL — AB (ref 65–99)
Glucose-Capillary: 144 mg/dL — ABNORMAL HIGH (ref 65–99)
Glucose-Capillary: 104 mg/dL — ABNORMAL HIGH (ref 65–99)
Glucose-Capillary: 165 mg/dL — ABNORMAL HIGH (ref 65–99)
Glucose-Capillary: 208 mg/dL — ABNORMAL HIGH (ref 65–99)
Glucose-Capillary: 278 mg/dL — ABNORMAL HIGH (ref 65–99)
Glucose-Capillary: 133 mg/dL — ABNORMAL HIGH (ref 65–99)

## 2017-05-14 LAB — CBC
HEMATOCRIT: 39.7 % (ref 39.0–52.0)
HEMOGLOBIN: 13.5 g/dL (ref 13.0–17.0)
MCH: 29.1 pg (ref 26.0–34.0)
MCHC: 34 g/dL (ref 30.0–36.0)
MCV: 85.6 fL (ref 78.0–100.0)
Platelets: 212 10*3/uL (ref 150–400)
RBC: 4.64 MIL/uL (ref 4.22–5.81)
RDW: 14.1 % (ref 11.5–15.5)
WBC: 10.3 10*3/uL (ref 4.0–10.5)

## 2017-05-14 LAB — HIV ANTIBODY (ROUTINE TESTING W REFLEX): HIV Screen 4th Generation wRfx: NONREACTIVE

## 2017-05-14 LAB — MRSA PCR SCREENING: MRSA by PCR: NEGATIVE

## 2017-05-14 LAB — LACTIC ACID, PLASMA: LACTIC ACID, VENOUS: 1.9 mmol/L (ref 0.5–1.9)

## 2017-05-14 MED ORDER — INSULIN STARTER KIT- SYRINGES (ENGLISH)
1.0000 | Freq: Once | Status: AC
  Administered 2017-05-14: 1
  Filled 2017-05-14: qty 1

## 2017-05-14 MED ORDER — SODIUM CHLORIDE 0.9 % IV SOLN
INTRAVENOUS | Status: DC
  Administered 2017-05-14: 19:00:00 via INTRAVENOUS

## 2017-05-14 MED ORDER — INSULIN ASPART 100 UNIT/ML ~~LOC~~ SOLN
0.0000 [IU] | Freq: Three times a day (TID) | SUBCUTANEOUS | Status: DC
  Administered 2017-05-14: 5 [IU] via SUBCUTANEOUS
  Administered 2017-05-14: 2 [IU] via SUBCUTANEOUS
  Administered 2017-05-14: 5 [IU] via SUBCUTANEOUS
  Administered 2017-05-15: 1 [IU] via SUBCUTANEOUS
  Administered 2017-05-16: 2 [IU] via SUBCUTANEOUS

## 2017-05-14 MED ORDER — DIATRIZOATE MEGLUMINE & SODIUM 66-10 % PO SOLN
90.0000 mL | Freq: Once | ORAL | Status: AC
  Administered 2017-05-14: 90 mL via NASOGASTRIC
  Filled 2017-05-14: qty 90

## 2017-05-14 MED ORDER — LIVING WELL WITH DIABETES BOOK
Freq: Once | Status: AC
  Administered 2017-05-14: 12:00:00
  Filled 2017-05-14: qty 1

## 2017-05-14 MED ORDER — INSULIN GLARGINE 100 UNIT/ML ~~LOC~~ SOLN
10.0000 [IU] | Freq: Every day | SUBCUTANEOUS | Status: DC
  Administered 2017-05-15 – 2017-05-16 (×2): 10 [IU] via SUBCUTANEOUS
  Filled 2017-05-14 (×2): qty 0.1

## 2017-05-14 MED ORDER — INSULIN ASPART 100 UNIT/ML ~~LOC~~ SOLN
0.0000 [IU] | SUBCUTANEOUS | Status: DC
  Administered 2017-05-14 – 2017-05-15 (×2): 1 [IU] via SUBCUTANEOUS
  Administered 2017-05-15: 2 [IU] via SUBCUTANEOUS

## 2017-05-14 MED ORDER — INSULIN GLARGINE 100 UNIT/ML ~~LOC~~ SOLN
10.0000 [IU] | Freq: Once | SUBCUTANEOUS | Status: AC
  Administered 2017-05-14: 10 [IU] via SUBCUTANEOUS
  Filled 2017-05-14: qty 0.1

## 2017-05-14 MED ORDER — POTASSIUM CHLORIDE 10 MEQ/100ML IV SOLN
10.0000 meq | INTRAVENOUS | Status: AC
  Administered 2017-05-14 (×5): 10 meq via INTRAVENOUS
  Filled 2017-05-14 (×5): qty 100

## 2017-05-14 NOTE — Progress Notes (Addendum)
Inpatient Diabetes Program Recommendations  AACE/ADA: New Consensus Statement on Inpatient Glycemic Control (2015)  Target Ranges:  Prepandial:   less than 140 mg/dL      Peak postprandial:   less than 180 mg/dL (1-2 hours)      Critically ill patients:  140 - 180 mg/dL  Results for CHAMAR, BROUGHTON (MRN [id number]) as of 05/14/2017 11:02  Ref. Range 05/14/2017 00:00 05/14/2017 01:03 05/14/2017 02:07 05/14/2017 03:15 05/14/2017 04:30 05/14/2017 05:44 05/14/2017 08:27  Glucose-Capillary Latest Ref Range: 65 - 99 mg/dL 131 (H) 135 (H) 147 (H) 104 (H) 165 (H) 208 (H) 264 (H)   Results for FERLIN, FAIRHURST (MRN [id number]) as of 05/14/2017 11:02  Ref. Range 05/13/2017 10:07  Glucose Latest Ref Range: 65 - 99 mg/dL [facility])  Results for LUCILE, DIDONATO (MRN [id number]) as of 05/14/2017 11:02  Ref. Range 05/13/2017 10:07  CO2 Latest Ref Range: 22 - 32 mmol/L 22  Results for NATHANYL, ANDUJO (MRN [id number]) as of 05/14/2017 11:02  Ref. Range 05/13/2017 10:07  Anion gap Latest Ref Range: 5 - 15  21 (H)  Results for KEONDRICK, DILKS (MRN [id number]) as of 05/14/2017 11:02  Ref. Range 05/13/2017 11:56  Lactic Acid, Venous Latest Ref Range: 0.5 - 1.9 mmol/L 5.59 (HH)   Review of Glycemic Control  Diabetes history: DM2 Outpatient Diabetes medications: Metformin 1000 mg BID Current orders for Inpatient glycemic control: Novolog 0-9 units TID with meals  Inpatient Diabetes Program Recommendations: Insulin - Basal: Note patient received Lantus 10 units at 9:14 today. Recommend ordering Lantus 10 units BID (to start at bedtime today). Correction (SSI): Since patient is NPO, please consider changing frequency of CBGs and Novolog correction to Q4H. HgbA1C: Please add on an A1C to blood in lab to evaluate glycemic control over the past 2-3 months.  NOTE: Initial glucose 615 mg/dl on 05/13/17, CO2 22, and AG 21 on initial labs. Patient was started on IV insulin drip and was transitioned to SQ insulin. Patient received one time dose of  Lantus 10 units today at 9:14 am.   Addendum 05/14/17@12 :12-Spoke with patient about diabetes and home regimen for diabetes control. Patient reports that he is followed by PCP for diabetes management and currently he takes Metformin 1000 mg BID as an outpatient for diabetes control. Patient confirms that he does not have any insurance and pays for doctor visits and medications out of pocket. Patient states that he had not been taking his Metformin like he should but starting taking it consistently about 3 months ago.  Patient reports that he went to an urgent care about 3 months ago and was prescribed an additional oral DM medication but it was over $400 at the pharmacy so he had them give him 10 pills which was over $200 and he has not gotten any other refills since he took the 10 pills. Patient does not remember the name of the other oral DM medication but states it was too expensive for him to stay on.  Patient states that he does not have a glucometer at home.  Provided patient with handout information on Reli-On products and encouraged patient to go to [facility] to get the Reli-On Prime glucometer for $9 and a box of 50 Reli-On test strips for $9. Encouraged patient to check his glucose 4 times per day (before meals and at bedtime) and to keep a record of glucose readings to take with him to follow up visits.  Inquired about knowledge about goal glucose and A1C values and patient states  that he is uncertain what goal values should be. Discussed glucose and A1C goals. Discussed importance of checking CBGs and maintaining good CBG control to prevent long-term and short-term complications. Patient reports that he thinks his doctor told him his A1C was 9% a few months ago. Discussed possibility of using insulin as an outpatient. Patient states that he would prefer to add an additional affordable oral DM medication if possible but he is willing to use insulin if needed. Discussed Lantus and Novolog insulin versus  generic insulins (NOVOLIN R, NPH, 70/30) since Lantus is about $250/vial, Novolog is about $150/vial, and NOVOLIN insulins are $25/vial.  Patient states the generic insulins would be the better option for him if he has to use insulin as an outpatient. Reviewed and demonstrated how to draw up and administer insulin with vial and syringe. Patient was able to successfully demonstrate how to draw up and administer insulin with vial and syringe. Patient was able to successfully administer himself an insulin injection during our conversation. Informed patient that a Living Well with diabetes booklet has been ordered and encouraged patient to read through entire book once he receives it. Discussed hypoglycemia along with proper treatment.  Informed patient that RNs will be asking him to self-administer insulin to ensure proper technique and ability to administer self insulin shots.   Patient verbalized understanding of information discussed and he states that he has no further questions at this time related to diabetes.   RNs to provide ongoing basic DM education at bedside with this patient and engage patient to actively check blood glucose and administer insulin injections.   MD-At time of discharge, if patient will be prescribed insulin please consider prescribing NOVOLIN Regular, NOVOLIN NPH, or NOVOLIN 70/30 which can be purchased at [facility] for $25 per vial. Also, please provide Rx for insulin syringes. IF patient will discharge with additional oral DM medications please provide an affordable medication if possible.  Thanks, Barnie Alderman, RN, MSN, CDE Diabetes Coordinator Inpatient Diabetes Program [phone] (Team Pager from 8am to 5pm)

## 2017-05-14 NOTE — Progress Notes (Signed)
PROGRESS NOTE    Jeremiah Bowman  ZOX:[id number] DOB: 01/12/1956 DOA: 05/13/2017 PCP: Everardo Beals, NP   Brief Narrative:   61 y.o. BM [place] his lived here for 22 years PMHx DM Type 2 uncontrolled with complication, HTN, HLD, AKI, SBO.   Presents to the emergency department with chief complaint 4 day history of persistent abdominal pain nausea and vomiting. Initial evaluation includes CT of the abdomen and pelvis concerning for partial small bowel obstruction and also reveals DKA  Information is obtained from the patient. He states he was in his usual state of health until about 4 days ago he developed gradual worsening nausea and vomiting. Associated symptoms include worsening distention, constipation shortness of breath and reflux. He also reports increased thirst and urination. He states he takes metformin for diabetes but admits to not managing/monitoring his blood sugar. He denies headache dizziness syncope or near-syncope. He denies chest pain palpitations lower extremity edema dysuria hematuria frequency or urgency. He denies fever chills cough.   Subjective: 6/29  A/O 4, negative CP, negative SOB, positive abdominal pain but improved, positive nausea, negative vomiting. Patient states had previous SBO in 2016.    Assessment & Plan:   Principal Problem:   Sepsis ([place]) Active Problems:   Abdominal pain   Nausea and vomiting   Diabetes mellitus (HCC)   HTN (hypertension)   Hyperlipidemia   Acute kidney injury ([place])   Lactic acidosis   Partial small bowel obstruction (HCC)   DKA (diabetic ketoacidoses) (HCC)   SIRS  -Patient needs criteria HR > 90, RR > 20. Doubt true infection. Stop antibiotics. Most likely secondary to DKA. -Monitor closely and if patient exhibits signs of Sepsis: Leukocytosis, fever will panculture and start empiric antibiotics.  DKA/Diabetes type 2 uncontrolled with complication/hyperglycemia diabetes type   -Serum glucose 615 on admission  within an ion gap of 21.  -Home medications include metformin. I'm not sure he takes this consistently. He admits to not monitoring his blood sugar. -Continue  Glucostabilizer protocol --Nothing by mouth for now -Diabetes coordinator consulted -Obtain hemoglobin A1c -Lipid panel pending -Lantus 10 units daily -Sensitive SSI  Small Bowel Obstruction  -Multifactorial to include previous SBO, diabetic gastroparesis. -Evaluated by general surgery who opined that if etiology is secondary to DKA should resolve quickly.  -NG tube -Nothing by mouth -Reglan -Patient a general surgery input -Once resolved may benefit from GI consult for work up   Acute Renal failure -Most likely secondary to DKA -Hold ACE, HCTZ -Hold all nephrotoxic medication -Strict I&O -Daily weight -Normal saline 75 ml/hr -Hold nephrotoxins  Hypokalemia -Potassium 50 mg      DVT prophylaxis: Subcutaneous heparin Code Status: Full Family Communication: None Disposition Plan: Resolution SBO   Consultants:  Surgery Dr. Rolm Bookbinder    Procedures/Significant Events:  None   VENTILATOR SETTINGS: None   Cultures 6/28 blood NGTD 6/28 MRSA by PCR negative    Antimicrobials: Anti-infectives    Start     Stop   05/13/17 2200  piperacillin-tazobactam (ZOSYN) IVPB 3.375 g  Status:  Discontinued     05/13/17 1654   05/13/17 1600  piperacillin-tazobactam (ZOSYN) IVPB 3.375 g  Status:  Discontinued     05/13/17 1702       Devices    LINES / TUBES:      Continuous Infusions: . dextrose 5 % and 0.45% NaCl 75 mL/hr at 05/14/17 1251     Objective: Vitals:   05/14/17 0500 05/14/17 0600 05/14/17 0816 05/14/17 1143  BP:  126/84 108/74 100/66 113/71  Pulse: (!) 120 (!) 117 (!) 110 (!) 110  Resp: (!) 26 17 (!) 26 (!) 23  Temp:   99.1 F (37.3 C) 99 F (37.2 C)  TempSrc:   Oral Oral  SpO2: 97% 97% 91% 98%  Weight:      Height:        Intake/Output Summary (Last 24 hours) at  05/14/17 1540 Last data filed at 05/14/17 1539  Gross per 24 hour  Intake          1509.39 ml  Output             7625 ml  Net         -6115.61 ml   Filed Weights   05/13/17 0941  Weight: 215 lb (97.5 kg)    Examination:  General: A/O 4, No acute respiratory distress Eyes: negative scleral hemorrhage, negative anisocoria, negative icterus ENT: Negative Runny nose, negative gingival bleeding, NG tube present draining bilious fluid  Neck:  Negative scars, masses, torticollis, lymphadenopathy, JVD Lungs: Clear to auscultation bilaterally without wheezes or crackles Cardiovascular: Regular rate and rhythm without murmur gallop or rub normal S1 and S2 Abdomen:Positive  abdominal pain,positive distention, firm,, positive soft, bowel sounds, no rebound, no ascites, no appreciable mass Extremities: No significant cyanosis, clubbing, or edema bilateral lower extremities Skin: Negative rashes, lesions, ulcers Psychiatric:  Negative depression, negative anxiety, negative fatigue, negative mania  Central nervous system:  Cranial nerves II through XII intact, tongue/uvula midline, all extremities muscle strength 5/5, sensation intact throughout, negative dysarthria, negative expressive aphasia, negative receptive aphasia.  .     Data Reviewed: Care during the described time interval was provided by me .  I have reviewed this patient's available data, including medical history, events of note, physical examination, and all test results as part of my evaluation. I have personally reviewed and interpreted all radiology studies.  CBC:  Recent Labs Lab 05/13/17 1007 05/13/17 1845 05/14/17 0658  WBC 15.8* 14.5* 10.3  NEUTROABS  --  10.5*  --   HGB 16.0 14.9 13.5  HCT 46.4 42.9 39.7  MCV 86.1 85.5 85.6  PLT 278 245 025   Basic Metabolic Panel:  Recent Labs Lab 05/13/17 1007 05/13/17 1845 05/13/17 1946 05/14/17 0658  NA 134* 139 137 139  K 4.2 3.9 4.3 3.6  CL 91* 105 103 103  CO2  22 21* 23 23  GLUCOSE 615* 140* 171* 239*  BUN 40* 44* 47* 43*  CREATININE 2.61* 2.49* 2.37* 1.86*  CALCIUM 9.9 8.9 8.6* 9.0   GFR: Estimated Creatinine Clearance: 47.2 mL/min (A) (by C-G formula based on SCr of 1.86 mg/dL (H)). Liver Function Tests:  Recent Labs Lab 05/13/17 1007 05/13/17 1845  AST 29 26  ALT 30 25  ALKPHOS 78 62  BILITOT 1.4* 1.0  PROT 7.8 6.7  ALBUMIN 4.2 3.4*    Recent Labs Lab 05/13/17 1007  LIPASE 27   No results for input(s): AMMONIA in the last 168 hours. Coagulation Profile:  Recent Labs Lab 05/13/17 1845  INR 1.08   Cardiac Enzymes: No results for input(s): CKTOTAL, CKMB, CKMBINDEX, TROPONINI in the last 168 hours. BNP (last 3 results) No results for input(s): PROBNP in the last 8760 hours. HbA1C: No results for input(s): HGBA1C in the last 72 hours. CBG:  Recent Labs Lab 05/14/17 0315 05/14/17 0430 05/14/17 0544 05/14/17 0827 05/14/17 1145  GLUCAP 104* 165* 208* 264* 278*   Lipid Profile: No results for input(s): CHOL, HDL,  LDLCALC, TRIG, CHOLHDL, LDLDIRECT in the last 72 hours. Thyroid Function Tests: No results for input(s): TSH, T4TOTAL, FREET4, T3FREE, THYROIDAB in the last 72 hours. Anemia Panel: No results for input(s): VITAMINB12, FOLATE, FERRITIN, TIBC, IRON, RETICCTPCT in the last 72 hours. Urine analysis:    Component Value Date/Time   COLORURINE YELLOW 05/13/2017 1608   APPEARANCEUR CLOUDY (A) 05/13/2017 1608   LABSPEC 1.013 05/13/2017 1608   PHURINE 5.0 05/13/2017 1608   GLUCOSEU >=500 (A) 05/13/2017 1608   HGBUR SMALL (A) 05/13/2017 1608   BILIRUBINUR NEGATIVE 05/13/2017 1608   KETONESUR NEGATIVE 05/13/2017 1608   PROTEINUR 30 (A) 05/13/2017 1608   NITRITE NEGATIVE 05/13/2017 1608   LEUKOCYTESUR TRACE (A) 05/13/2017 1608   Sepsis Labs: @LABRCNTIP (procalcitonin:4,lacticidven:4)  ) Recent Results (from the past 240 hour(s))  MRSA PCR Screening     Status: None   Collection Time: 05/13/17  9:40 PM    Result Value Ref Range Status   MRSA by PCR NEGATIVE NEGATIVE Final    Comment:        The GeneXpert MRSA Assay (FDA approved for NASAL specimens only), is one component of a comprehensive MRSA colonization surveillance program. It is not intended to diagnose MRSA infection nor to guide or monitor treatment for MRSA infections.          Radiology Studies: Ct Abdomen Pelvis Wo Contrast  Result Date: 05/13/2017 CLINICAL DATA:  Diffuse abdominal pain for several days EXAM: CT ABDOMEN AND PELVIS WITHOUT CONTRAST TECHNIQUE: Multidetector CT imaging of the abdomen and pelvis was performed following the standard protocol without IV contrast. COMPARISON:  10/24/2015 FINDINGS: Lower chest: Mild scarring is noted bilaterally. No focal infiltrate is seen. Small nodule is noted along the major fissure on the left stable from the prior exam. Some other previously seen nodules are not as well appreciated on today's exam. Hepatobiliary: The liver is within normal limits. The gallbladder is unremarkable. Pancreas: Unremarkable. No pancreatic ductal dilatation or surrounding inflammatory changes. Spleen: Normal in size without focal abnormality. Adrenals/Urinary Tract: Fullness of the left adrenal gland is again seen and stable. The right adrenal gland is unremarkable. The kidneys demonstrate no renal calculi or obstructive changes. The bladder is partially distended. Stomach/Bowel: The stomach is significantly distended with fluid and food stuffs. This continues into the proximal small bowel with significant distention and fecalization of small bowel contents in the distal jejunum/ proximal ileum. The distal ileum is decompressed. The colon shows diverticular change. The appendix is within normal limits. A definitive transition zone is not appreciated similar to that noted on prior exam but lies deep within the pelvis. No definitive mass lesion is seen. Vascular/Lymphatic: No significant vascular findings  are present. No enlarged abdominal or pelvic lymph nodes. Reproductive: Prostate is unremarkable. Other: No abdominal wall hernia or abnormality. No abdominopelvic ascites. Musculoskeletal: No acute or significant osseous findings. IMPRESSION: 1. Significant dilatation of the stomach, jejunum and likely proximal ileum related to an area within the pelvis seen on images 73-76 of series 3. This is consistent with a partial small bowel obstruction. The actual transition zone is not well appreciated. The overall appearance is similar to that seen on the prior exam although the degree of small bowel dilatation is increased. 2. Diverticulosis without diverticulitis. 3. Previously seen nodular densities in the lung bases are not as well appreciated Electronically Signed   By: Inez Catalina M.D.   On: 05/13/2017 15:06   Dg Chest 2 View  Result Date: 05/13/2017 CLINICAL DATA:  States developed  nausea and emesis denies diarrhea one day ago and continued today with chest burning and abdominal pain. Pt also complaining of left shoulder pain radiating into his lower back. EXAM: CHEST  2 VIEW COMPARISON:  10/24/2015 FINDINGS: The heart size and mediastinal contours are within normal limits. Both lungs are clear. The visualized skeletal structures are unremarkable. IMPRESSION: No active cardiopulmonary disease. Electronically Signed   By: Kathreen Devoid   On: 05/13/2017 10:24   Dg Abd Portable 1v-small Bowel Protocol-position Verification  Result Date: 05/14/2017 CLINICAL DATA:  NG tube placement. EXAM: PORTABLE ABDOMEN - 1 VIEW COMPARISON:  05/13/2017 . FINDINGS: NG tube noted with tip projected in the stomach. Radiopacities noted over the stomach may represent contrast. No gastric distention. Basilar atelectasis. Degenerative changes thoracolumbar spine. IMPRESSION: NG tube noted with tip in the stomach.  No gastric distention . Electronically Signed   By: Marcello Moores  Register   On: 05/14/2017 07:22   Dg Abd Portable 1  View  Result Date: 05/13/2017 CLINICAL DATA:  NG tube placement EXAM: PORTABLE ABDOMEN - 1 VIEW COMPARISON:  06/28/ 2018, 10/26/2015 FINDINGS: Low lung volumes with atelectasis or infiltrate at the left lung base. Esophageal tube tip projects over the mid gastric region, side-port over the proximal stomach. Partially visualized dilated small bowel in the left abdomen. Probable moderate residual gaseous dilatation of the stomach IMPRESSION: Esophageal tube tip overlies the mid stomach. Suspect that there is residual moderate gastric distention Electronically Signed   By: Donavan Foil M.D.   On: 05/13/2017 18:24        Scheduled Meds: . heparin  5,000 Units Subcutaneous Q8H  . insulin aspart  0-9 Units Subcutaneous TID WC  . metoCLOPramide (REGLAN) injection  5 mg Intravenous Q8H  . metoprolol tartrate  5 mg Intravenous Q6H   Continuous Infusions: . dextrose 5 % and 0.45% NaCl 75 mL/hr at 05/14/17 1251     LOS: 1 day    Time spent: 40 minutes    Kol Consuegra, Geraldo Docker, MD [facility] Pager [phone]   If 7PM-7AM, please contact night-coverage [email or web address] Password [facility] 05/14/2017, 3:40 PM

## 2017-05-14 NOTE — Care Management Note (Signed)
Case Management Note  Patient Details  Name: Jeremiah Bowman MRN: [id number] Date of Birth: Nov 13, 1956  Subjective/Objective:                 Patient from home with spouse. Admitted with SBO and DKA. [place] for insulin/ medication.  Patient met with Diabetes Educator. Please see detailed note. Rec are as follows for insulin: MD-At time of discharge, if patient will be prescribed insulin please consider prescribing NOVOLIN Regular, NOVOLIN NPH, or NOVOLIN 70/30 which can be purchased at [facility] for $25 per vial. Also, please provide Rx for insulin syringes.  Patient has PCP, but is uninsured. Patient would qualify for [place] at DC, however this will only provide a one month supply of medication.  PCP: Everardo Beals     Action/Plan:   Expected Discharge Date:                  Expected Discharge Plan:  [place]  In-House Referral:     Discharge planning Services  CM Consult  Post Acute Care Choice:    Choice offered to:     DME Arranged:    DME Agency:     HH Arranged:    HH Agency:     Status of Service:  In process, will continue to follow  If discussed at Long Length of Stay Meetings, dates discussed:    Additional Comments:  Jeremiah Collet, RN 05/14/2017, 3:49 PM

## 2017-05-14 NOTE — Progress Notes (Signed)
Patient ID: Jeremiah Bowman, male   DOB: 1956/04/01, 61 y.o.   MRN: [id number]  [facility] Surgery Progress Note     Subjective: CC- SBO, abdominal pain Sitting up in bed. States that he feels much better this morning. Abdomen still distended but improved since NG tube placement. Per RN he has had 6L output from NGT since yesterday. He denies n/v. No flatus or BM.  Having good UOP.  Objective: Vital signs in last 24 hours: Temp:  [98.1 F (36.7 C)-99.2 F (37.3 C)] 99.1 F (37.3 C) (06/29 0343) Pulse Rate:  [82-123] 121 (06/29 0400) Resp:  [14-37] 24 (06/29 0400) BP: (89-151)/(56-115) 128/86 (06/29 0400) SpO2:  [93 %-100 %] 94 % (06/29 0400) Weight:  [215 lb (97.5 kg)] 215 lb (97.5 kg) (06/28 0941)    Intake/Output from previous day: 06/28 0701 - 06/29 0700 In: 620.6 [I.V.:570.6; IV Piggyback:50] Out: 4325 [Urine:825; Emesis/NG output:1500] Intake/Output this shift: No intake/output data recorded.  PE: Gen:  Alert, NAD, pleasant HEENT: EOM's intact, pupils equal  Card:  RRR, no M/G/R heard Pulm:  CTAB, no W/R/R, effort normal Abd: Soft, distended, nontender, few BS heard Ext:  No erythema, edema, or tenderness BUE/BLE  Psych: A&Ox3  Skin: no rashes noted, warm and dry  Lab Results:   Recent Labs  05/13/17 1845 05/14/17 0658  WBC 14.5* 10.3  HGB 14.9 13.5  HCT 42.9 39.7  PLT 245 212   BMET  Recent Labs  05/13/17 1845 05/13/17 1946  NA 139 137  K 3.9 4.3  CL 105 103  CO2 21* 23  GLUCOSE 140* 171*  BUN 44* 47*  CREATININE 2.49* 2.37*  CALCIUM 8.9 8.6*   PT/INR  Recent Labs  05/13/17 1845  LABPROT 14.0  INR 1.08   CMP     Component Value Date/Time   NA 137 05/13/2017 1946   K 4.3 05/13/2017 1946   CL 103 05/13/2017 1946   CO2 23 05/13/2017 1946   GLUCOSE 171 (H) 05/13/2017 1946   BUN 47 (H) 05/13/2017 1946   CREATININE 2.37 (H) 05/13/2017 1946   CALCIUM 8.6 (L) 05/13/2017 1946   PROT 6.7 05/13/2017 1845   ALBUMIN 3.4 (L) 05/13/2017  1845   AST 26 05/13/2017 1845   ALT 25 05/13/2017 1845   ALKPHOS 62 05/13/2017 1845   BILITOT 1.0 05/13/2017 1845   GFRNONAA 28 (L) 05/13/2017 1946   GFRAA 32 (L) 05/13/2017 1946   Lipase     Component Value Date/Time   LIPASE 27 05/13/2017 1007       Studies/Results: Ct Abdomen Pelvis Wo Contrast  Result Date: 05/13/2017 CLINICAL DATA:  Diffuse abdominal pain for several days EXAM: CT ABDOMEN AND PELVIS WITHOUT CONTRAST TECHNIQUE: Multidetector CT imaging of the abdomen and pelvis was performed following the standard protocol without IV contrast. COMPARISON:  10/24/2015 FINDINGS: Lower chest: Mild scarring is noted bilaterally. No focal infiltrate is seen. Small nodule is noted along the major fissure on the left stable from the prior exam. Some other previously seen nodules are not as well appreciated on today's exam. Hepatobiliary: The liver is within normal limits. The gallbladder is unremarkable. Pancreas: Unremarkable. No pancreatic ductal dilatation or surrounding inflammatory changes. Spleen: Normal in size without focal abnormality. Adrenals/Urinary Tract: Fullness of the left adrenal gland is again seen and stable. The right adrenal gland is unremarkable. The kidneys demonstrate no renal calculi or obstructive changes. The bladder is partially distended. Stomach/Bowel: The stomach is significantly distended with fluid and food stuffs. This  continues into the proximal small bowel with significant distention and fecalization of small bowel contents in the distal jejunum/ proximal ileum. The distal ileum is decompressed. The colon shows diverticular change. The appendix is within normal limits. A definitive transition zone is not appreciated similar to that noted on prior exam but lies deep within the pelvis. No definitive mass lesion is seen. Vascular/Lymphatic: No significant vascular findings are present. No enlarged abdominal or pelvic lymph nodes. Reproductive: Prostate is  unremarkable. Other: No abdominal wall hernia or abnormality. No abdominopelvic ascites. Musculoskeletal: No acute or significant osseous findings. IMPRESSION: 1. Significant dilatation of the stomach, jejunum and likely proximal ileum related to an area within the pelvis seen on images 73-76 of series 3. This is consistent with a partial small bowel obstruction. The actual transition zone is not well appreciated. The overall appearance is similar to that seen on the prior exam although the degree of small bowel dilatation is increased. 2. Diverticulosis without diverticulitis. 3. Previously seen nodular densities in the lung bases are not as well appreciated Electronically Signed   By: Inez Catalina M.D.   On: 05/13/2017 15:06   Dg Chest 2 View  Result Date: 05/13/2017 CLINICAL DATA:  States developed nausea and emesis denies diarrhea one day ago and continued today with chest burning and abdominal pain. Pt also complaining of left shoulder pain radiating into his lower back. EXAM: CHEST  2 VIEW COMPARISON:  10/24/2015 FINDINGS: The heart size and mediastinal contours are within normal limits. Both lungs are clear. The visualized skeletal structures are unremarkable. IMPRESSION: No active cardiopulmonary disease. Electronically Signed   By: Kathreen Devoid   On: 05/13/2017 10:24   Dg Abd Portable 1v-small Bowel Protocol-position Verification  Result Date: 05/14/2017 CLINICAL DATA:  NG tube placement. EXAM: PORTABLE ABDOMEN - 1 VIEW COMPARISON:  05/13/2017 . FINDINGS: NG tube noted with tip projected in the stomach. Radiopacities noted over the stomach may represent contrast. No gastric distention. Basilar atelectasis. Degenerative changes thoracolumbar spine. IMPRESSION: NG tube noted with tip in the stomach.  No gastric distention . Electronically Signed   By: Marcello Moores  Register   On: 05/14/2017 07:22   Dg Abd Portable 1 View  Result Date: 05/13/2017 CLINICAL DATA:  NG tube placement EXAM: PORTABLE ABDOMEN -  1 VIEW COMPARISON:  06/28/ 2018, 10/26/2015 FINDINGS: Low lung volumes with atelectasis or infiltrate at the left lung base. Esophageal tube tip projects over the mid gastric region, side-port over the proximal stomach. Partially visualized dilated small bowel in the left abdomen. Probable moderate residual gaseous dilatation of the stomach IMPRESSION: Esophageal tube tip overlies the mid stomach. Suspect that there is residual moderate gastric distention Electronically Signed   By: Donavan Foil M.D.   On: 05/13/2017 18:24    Anti-infectives: Anti-infectives    Start     Dose/Rate Route Frequency Ordered Stop   05/13/17 2200  piperacillin-tazobactam (ZOSYN) IVPB 3.375 g  Status:  Discontinued     3.375 g 12.5 mL/hr over 240 Minutes Intravenous Every 8 hours 05/13/17 1611 05/13/17 1654   05/13/17 1600  piperacillin-tazobactam (ZOSYN) IVPB 3.375 g  Status:  Discontinued     3.375 g 100 mL/hr over 30 Minutes Intravenous  Once 05/13/17 1549 05/13/17 1702       Assessment/Plan DKA AKI DM  SBO vs ileus - no prior h/o abdominal surgery - 1 previous episode similar to this in 2016 that resolved without intervention - NG with 6L output since placement yesterday - no flatus/BM -  XR pending  ID - zosyn 6/28>> FEN - IVF, NPO/NGT VTE - SCDs, heparin  Plan - SB protocol started, patient received gastrograffin 0624 this AM, XR later today. Continue NG tube to LIWS.   LOS: 1 day    Jerrye Beavers , [facility] Surgery 05/14/2017, 7:33 AM Pager: [phone] Consults: [phone] Mon-Fri 7:00 am-4:30 pm Sat-Sun 7:00 am-11:30 am

## 2017-05-15 DIAGNOSIS — R1084 Generalized abdominal pain: Secondary | ICD-10-CM

## 2017-05-15 LAB — GLUCOSE, CAPILLARY
GLUCOSE-CAPILLARY: 122 mg/dL — AB (ref 65–99)
GLUCOSE-CAPILLARY: 89 mg/dL (ref 65–99)
Glucose-Capillary: 111 mg/dL — ABNORMAL HIGH (ref 65–99)
Glucose-Capillary: 122 mg/dL — ABNORMAL HIGH (ref 65–99)
Glucose-Capillary: 165 mg/dL — ABNORMAL HIGH (ref 65–99)

## 2017-05-15 LAB — BASIC METABOLIC PANEL
Anion gap: 7 (ref 5–15)
BUN: 22 mg/dL — ABNORMAL HIGH (ref 6–20)
CHLORIDE: 109 mmol/L (ref 101–111)
CO2: 26 mmol/L (ref 22–32)
Calcium: 8.8 mg/dL — ABNORMAL LOW (ref 8.9–10.3)
Creatinine, Ser: 0.96 mg/dL (ref 0.61–1.24)
GFR calc Af Amer: >60 mL/min (ref >60)
GFR calc non Af Amer: >60 mL/min (ref >60)
Glucose, Bld: 98 mg/dL (ref 65–99)
POTASSIUM: 4.1 mmol/L (ref 3.5–5.1)
SODIUM: 142 mmol/L (ref 135–145)

## 2017-05-15 LAB — HEMOGLOBIN A1C
HEMOGLOBIN A1C: 10.4 % — AB (ref 4.8–5.6)
Hgb A1c MFr Bld: 10.4 % — ABNORMAL HIGH (ref 4.8–5.6)
Mean Plasma Glucose: 252 mg/dL
Mean Plasma Glucose: 252 mg/dL

## 2017-05-15 LAB — MAGNESIUM: Magnesium: 2.2 mg/dL (ref 1.7–2.4)

## 2017-05-15 MED ORDER — METFORMIN HCL 500 MG PO TABS
1000.0000 mg | ORAL_TABLET | Freq: Two times a day (BID) | ORAL | Status: DC
  Administered 2017-05-15 – 2017-05-16 (×2): 1000 mg via ORAL
  Filled 2017-05-15 (×2): qty 2

## 2017-05-15 MED ORDER — METOCLOPRAMIDE HCL 10 MG PO TABS
5.0000 mg | ORAL_TABLET | Freq: Three times a day (TID) | ORAL | Status: DC
  Administered 2017-05-16 (×2): 5 mg via ORAL
  Filled 2017-05-15 (×2): qty 1

## 2017-05-15 NOTE — Progress Notes (Signed)
PROGRESS NOTE    Jeremiah Bowman  VEL:[id number] DOB: January 13, 1956 DOA: 05/13/2017 PCP: Everardo Beals, NP   Brief Narrative:   61 y.o. BM [place] his lived here for 51 years PMHx DM Type 2 uncontrolled with complication, HTN, HLD, AKI, SBO.   Presents to the emergency department with chief complaint 4 day history of persistent abdominal pain nausea and vomiting. Initial evaluation includes CT of the abdomen and pelvis concerning for partial small bowel obstruction and also reveals DKA  Information is obtained from the patient. He states he was in his usual state of health until about 4 days ago he developed gradual worsening nausea and vomiting. Associated symptoms include worsening distention, constipation shortness of breath and reflux. He also reports increased thirst and urination. He states he takes metformin for diabetes but admits to not managing/monitoring his blood sugar. He denies headache dizziness syncope or near-syncope. He denies chest pain palpitations lower extremity edema dysuria hematuria frequency or urgency. He denies fever chills cough.   Subjective: 6/30   A/O 4, negative CP, negative SOB, negative abdominal pain, negative nausea, negative vomiting.     Assessment & Plan:   Principal Problem:   Sepsis ([place]) Active Problems:   Abdominal pain   Nausea and vomiting   Diabetes mellitus (HCC)   HTN (hypertension)   Hyperlipidemia   Acute kidney injury ([place])   Lactic acidosis   Partial small bowel obstruction (HCC)   DKA (diabetic ketoacidoses) (HCC)   SIRS  -Patient needs criteria HR > 90, RR > 20. Doubt true infection. Stop antibiotics. Most likely secondary to DKA. -Resolved   DKA/Diabetes type 2 uncontrolled with complication/hyperglycemia diabetes type   -Serum glucose 615 on admission within an ion gap of 21.  -Restart home Metformin 1000 mg  . -Diabetes coordinator consulted -6/29 Hemoglobin A1c= 10.4 -Lipid panel within ADA guidelines -Lantus 10  units daily: Given patient's poor control would discharge on insulin -Sensitive SSI  Small Bowel Obstruction  -Multifactorial to include previous SBO, diabetic gastroparesis. -Evaluated by general surgery who opined that if etiology is secondary to DKA should resolve quickly.  -Tolerating heart healthy/carb modified diet. If tolerates overnight discharge in A.m. -Reglan 5 mg QAC   Acute Renal failure -Most likely secondary to DKA -Hold ACE, HCTZ -Hold all nephrotoxic medication -Strict I&O since admission -6.5 L -Daily weight Filed Weights   05/13/17 0941  Weight: 215 lb (97.5 kg)  -Hold nephrotoxins  Hypokalemia -Potassium 50 mg      DVT prophylaxis: Subcutaneous heparin Code Status: Full Family Communication: None Disposition Plan: Resolution SBO   Consultants:  Surgery Dr. Rolm Bookbinder    Procedures/Significant Events:  None   VENTILATOR SETTINGS: None   Cultures 6/28 blood NGTD 6/28 MRSA by PCR negative    Antimicrobials: Anti-infectives    Start     Stop   05/13/17 2200  piperacillin-tazobactam (ZOSYN) IVPB 3.375 g  Status:  Discontinued     05/13/17 1654   05/13/17 1600  piperacillin-tazobactam (ZOSYN) IVPB 3.375 g  Status:  Discontinued     05/13/17 1702       Devices    LINES / TUBES:      Continuous Infusions: . sodium chloride 75 mL/hr at 05/14/17 1926     Objective: Vitals:   05/14/17 1943 05/15/17 0010 05/15/17 0347 05/15/17 0752  BP: (!) 109/91 128/80 (!) 144/90 117/82  Pulse: 99 97 82   Resp: (!) 22 (!) 24 12   Temp: 99.2 F (37.3 C) 98.4 F (36.9  C) 98.6 F (37 C) 98.7 F (37.1 C)  TempSrc: Axillary Oral Oral Oral  SpO2: 96% 96% 100%   Weight:      Height:        Intake/Output Summary (Last 24 hours) at 05/15/17 0813 Last data filed at 05/15/17 0700  Gross per 24 hour  Intake           1867.5 ml  Output             1326 ml  Net            541.5 ml   Filed Weights   05/13/17 0941  Weight: 215  lb (97.5 kg)    Examination:  General: A/O 4, No acute respiratory distress, Sitting in chair comfortably Eyes: negative scleral hemorrhage, negative anisocoria, negative icterus ENT: Negative Runny nose, negative gingival bleeding, NG tube present draining bilious fluid  Neck:  Negative scars, masses, torticollis, lymphadenopathy, JVD Lungs: Clear to auscultation bilaterally without wheezes or crackles Cardiovascular: Regular rate and rhythm without murmur gallop or rub normal S1 and S2 Abdomen: Negative  abdominal pain,positive distention, soft,, positive soft, bowel sounds, no rebound, no ascites, no appreciable mass Extremities: No significant cyanosis, clubbing, or edema bilateral lower extremities Skin: Negative rashes, lesions, ulcers Psychiatric:  Negative depression, negative anxiety, negative fatigue, negative mania  Central nervous system:  Cranial nerves II through XII intact, tongue/uvula midline, all extremities muscle strength 5/5, sensation intact throughout, negative dysarthria, negative expressive aphasia, negative receptive aphasia.  .     Data Reviewed: Care during the described time interval was provided by me .  I have reviewed this patient's available data, including medical history, events of note, physical examination, and all test results as part of my evaluation. I have personally reviewed and interpreted all radiology studies.  CBC:  Recent Labs Lab 05/13/17 1007 05/13/17 1845 05/14/17 0658  WBC 15.8* 14.5* 10.3  NEUTROABS  --  10.5*  --   HGB 16.0 14.9 13.5  HCT 46.4 42.9 39.7  MCV 86.1 85.5 85.6  PLT 278 245 096   Basic Metabolic Panel:  Recent Labs Lab 05/13/17 1845 05/13/17 1946 05/14/17 0658 05/14/17 1607 05/15/17 0252  NA 139 137 139 138 142  K 3.9 4.3 3.6 3.3* 4.1  CL 105 103 103 105 109  CO2 21* 23 23 25 26   GLUCOSE 140* 171* 239* 183* 98  BUN 44* 47* 43* 33* 22*  CREATININE 2.49* 2.37* 1.86* 1.19 0.96  CALCIUM 8.9 8.6* 9.0 9.1  8.8*  MG  --   --   --   --  2.2   GFR: Estimated Creatinine Clearance: 91.4 mL/min (by C-G formula based on SCr of 0.96 mg/dL). Liver Function Tests:  Recent Labs Lab 05/13/17 1007 05/13/17 1845  AST 29 26  ALT 30 25  ALKPHOS 78 62  BILITOT 1.4* 1.0  PROT 7.8 6.7  ALBUMIN 4.2 3.4*    Recent Labs Lab 05/13/17 1007  LIPASE 27   No results for input(s): AMMONIA in the last 168 hours. Coagulation Profile:  Recent Labs Lab 05/13/17 1845  INR 1.08   Cardiac Enzymes: No results for input(s): CKTOTAL, CKMB, CKMBINDEX, TROPONINI in the last 168 hours. BNP (last 3 results) No results for input(s): PROBNP in the last 8760 hours. HbA1C:  Recent Labs  05/14/17 1600 05/14/17 1913  HGBA1C 10.4* 10.4*   CBG:  Recent Labs Lab 05/14/17 1145 05/14/17 1649 05/14/17 2035 05/15/17 0013 05/15/17 0408  GLUCAP 278* 175* 133* 89 111*  Lipid Profile:  Recent Labs  05/14/17 1607 05/14/17 1913  CHOL 120 119  HDL 38* 39*  LDLCALC 51 51  TRIG 156* 145  CHOLHDL 3.2 3.1   Thyroid Function Tests: No results for input(s): TSH, T4TOTAL, FREET4, T3FREE, THYROIDAB in the last 72 hours. Anemia Panel: No results for input(s): VITAMINB12, FOLATE, FERRITIN, TIBC, IRON, RETICCTPCT in the last 72 hours. Urine analysis:    Component Value Date/Time   COLORURINE YELLOW 05/13/2017 1608   APPEARANCEUR CLOUDY (A) 05/13/2017 1608   LABSPEC 1.013 05/13/2017 1608   PHURINE 5.0 05/13/2017 1608   GLUCOSEU >=500 (A) 05/13/2017 1608   HGBUR SMALL (A) 05/13/2017 1608   BILIRUBINUR NEGATIVE 05/13/2017 [place] 05/13/2017 1608   PROTEINUR 30 (A) 05/13/2017 1608   NITRITE NEGATIVE 05/13/2017 1608   LEUKOCYTESUR TRACE (A) 05/13/2017 1608   Sepsis Labs: @LABRCNTIP (procalcitonin:4,lacticidven:4)  ) Recent Results (from the past 240 hour(s))  Culture, blood (x 2)     Status: None (Preliminary result)   Collection Time: 05/13/17  6:50 PM  Result Value Ref Range Status     Specimen Description BLOOD RIGHT ANTECUBITAL  Final   Special Requests   Final    BOTTLES DRAWN AEROBIC ONLY Blood Culture adequate volume   Culture NO GROWTH < 24 HOURS  Final   Report Status PENDING  Incomplete  Culture, blood (routine x 2)     Status: None (Preliminary result)   Collection Time: 05/13/17  6:55 PM  Result Value Ref Range Status   Specimen Description BLOOD RIGHT HAND  Final   Special Requests   Final    BOTTLES DRAWN AEROBIC ONLY Blood Culture adequate volume   Culture NO GROWTH < 24 HOURS  Final   Report Status PENDING  Incomplete  MRSA PCR Screening     Status: None   Collection Time: 05/13/17  9:40 PM  Result Value Ref Range Status   MRSA by PCR NEGATIVE NEGATIVE Final    Comment:        The GeneXpert MRSA Assay (FDA approved for NASAL specimens only), is one component of a comprehensive MRSA colonization surveillance program. It is not intended to diagnose MRSA infection nor to guide or monitor treatment for MRSA infections.          Radiology Studies: Ct Abdomen Pelvis Wo Contrast  Result Date: 05/13/2017 CLINICAL DATA:  Diffuse abdominal pain for several days EXAM: CT ABDOMEN AND PELVIS WITHOUT CONTRAST TECHNIQUE: Multidetector CT imaging of the abdomen and pelvis was performed following the standard protocol without IV contrast. COMPARISON:  10/24/2015 FINDINGS: Lower chest: Mild scarring is noted bilaterally. No focal infiltrate is seen. Small nodule is noted along the major fissure on the left stable from the prior exam. Some other previously seen nodules are not as well appreciated on today's exam. Hepatobiliary: The liver is within normal limits. The gallbladder is unremarkable. Pancreas: Unremarkable. No pancreatic ductal dilatation or surrounding inflammatory changes. Spleen: Normal in size without focal abnormality. Adrenals/Urinary Tract: Fullness of the left adrenal gland is again seen and stable. The right adrenal gland is unremarkable. The  kidneys demonstrate no renal calculi or obstructive changes. The bladder is partially distended. Stomach/Bowel: The stomach is significantly distended with fluid and food stuffs. This continues into the proximal small bowel with significant distention and fecalization of small bowel contents in the distal jejunum/ proximal ileum. The distal ileum is decompressed. The colon shows diverticular change. The appendix is within normal limits. A definitive transition zone is not  appreciated similar to that noted on prior exam but lies deep within the pelvis. No definitive mass lesion is seen. Vascular/Lymphatic: No significant vascular findings are present. No enlarged abdominal or pelvic lymph nodes. Reproductive: Prostate is unremarkable. Other: No abdominal wall hernia or abnormality. No abdominopelvic ascites. Musculoskeletal: No acute or significant osseous findings. IMPRESSION: 1. Significant dilatation of the stomach, jejunum and likely proximal ileum related to an area within the pelvis seen on images 73-76 of series 3. This is consistent with a partial small bowel obstruction. The actual transition zone is not well appreciated. The overall appearance is similar to that seen on the prior exam although the degree of small bowel dilatation is increased. 2. Diverticulosis without diverticulitis. 3. Previously seen nodular densities in the lung bases are not as well appreciated Electronically Signed   By: Inez Catalina M.D.   On: 05/13/2017 15:06   Dg Chest 2 View  Result Date: 05/13/2017 CLINICAL DATA:  States developed nausea and emesis denies diarrhea one day ago and continued today with chest burning and abdominal pain. Pt also complaining of left shoulder pain radiating into his lower back. EXAM: CHEST  2 VIEW COMPARISON:  10/24/2015 FINDINGS: The heart size and mediastinal contours are within normal limits. Both lungs are clear. The visualized skeletal structures are unremarkable. IMPRESSION: No active  cardiopulmonary disease. Electronically Signed   By: Kathreen Devoid   On: 05/13/2017 10:24   Dg Abd Portable 1v-small Bowel Obstruction Protocol-initial, 8 Hr Delay  Result Date: 05/14/2017 CLINICAL DATA:  61 year old male with small bowel obstruction. NG tube placed with Oral contrast within the stomach at 0630 hours today. EXAM: PORTABLE ABDOMEN - 1 VIEW COMPARISON:  0630 hours today and earlier. FINDINGS: Portable AP view at 1705 hours. Visible enteric tube appears stable with side hole up the level of the proximal gastric body. Moderate gaseous distension of the stomach persists. The oral contrast which was all within the stomach at 0630 hours today has now reached the rectum. There is a small volume of contrast in mildly dilated small bowel loops in the left abdomen. There is residual contrast throughout the colon. Persistent gas dilated left abdominal small bowel loops up to 46 mm. No acute osseous abnormality identified. IMPRESSION: 1. Partial small bowel obstruction. Oral contrast administered this morning has reached the rectum. 2. Continued dilated stomach and left abdominal small bowel loops. 3. Stable enteric tube, side hole to level of the proximal gastric body. Electronically Signed   By: Genevie Ann M.D.   On: 05/14/2017 17:36   Dg Abd Portable 1v-small Bowel Protocol-position Verification  Result Date: 05/14/2017 CLINICAL DATA:  NG tube placement. EXAM: PORTABLE ABDOMEN - 1 VIEW COMPARISON:  05/13/2017 . FINDINGS: NG tube noted with tip projected in the stomach. Radiopacities noted over the stomach may represent contrast. No gastric distention. Basilar atelectasis. Degenerative changes thoracolumbar spine. IMPRESSION: NG tube noted with tip in the stomach.  No gastric distention . Electronically Signed   By: Marcello Moores  Register   On: 05/14/2017 07:22   Dg Abd Portable 1 View  Result Date: 05/13/2017 CLINICAL DATA:  NG tube placement EXAM: PORTABLE ABDOMEN - 1 VIEW COMPARISON:  06/28/ 2018,  10/26/2015 FINDINGS: Low lung volumes with atelectasis or infiltrate at the left lung base. Esophageal tube tip projects over the mid gastric region, side-port over the proximal stomach. Partially visualized dilated small bowel in the left abdomen. Probable moderate residual gaseous dilatation of the stomach IMPRESSION: Esophageal tube tip overlies the mid stomach. Suspect  that there is residual moderate gastric distention Electronically Signed   By: Donavan Foil M.D.   On: 05/13/2017 18:24        Scheduled Meds: . heparin  5,000 Units Subcutaneous Q8H  . insulin aspart  0-9 Units Subcutaneous TID WC  . insulin aspart  0-9 Units Subcutaneous Q4H  . insulin glargine  10 Units Subcutaneous Daily  . metoCLOPramide (REGLAN) injection  5 mg Intravenous Q8H  . metoprolol tartrate  5 mg Intravenous Q6H   Continuous Infusions: . sodium chloride 75 mL/hr at 05/14/17 1926     LOS: 2 days    Time spent: 40 minutes    WOODS, Geraldo Docker, MD [facility] Pager [phone]   If 7PM-7AM, please contact night-coverage [email or web address] Password [facility] 05/15/2017, 8:13 AM

## 2017-05-15 NOTE — Progress Notes (Signed)
Pt doing well this AM.  Dislodged NG when getting up to bedside chair prior to day shift RN coming on.  Dr. Sherral Hammers notified and ordered CL diet based on pt stability; positive bowel sounds in all quads, two large BMs yesterday, positive for flatus, no n/v.  Pt tolerating 212ml clear liquids.  Dr. Donne Hazel came to bedside to assess and ordered to advance diet as tolerated.  Pt encouraged to start slowly w/solids.  Will continue to monitor.  Pt also educated again re: diabetes and administered his own insulin coverage.  Discussed proper diet, monitoring of blood sugar levels, insulin sliding scales, and Lantus.

## 2017-05-15 NOTE — Progress Notes (Signed)
Subjective/Chief Complaint: Having normal bowel function, no n/v, feels stomach back to normal   Objective: Vital signs in last 24 hours: Temp:  [98.4 F (36.9 C)-99.2 F (37.3 C)] 98.7 F (37.1 C) (06/30 0752) Pulse Rate:  [82-110] 82 (06/30 0347) Resp:  [12-25] 12 (06/30 0347) BP: (109-144)/(71-91) 117/82 (06/30 0752) SpO2:  [95 %-100 %] 100 % (06/30 0347) Last BM Date: 05/14/17  Intake/Output from previous day: 06/29 0701 - 06/30 0700 In: 1867.5 [I.V.:1767.5; IV Piggyback:100] Out: 1726 [Urine:1325; Emesis/NG output:400; Stool:1] Intake/Output this shift: No intake/output data recorded.  GI: soft nontender nondistended  Lab Results:   Recent Labs  05/13/17 1845 05/14/17 0658  WBC 14.5* 10.3  HGB 14.9 13.5  HCT 42.9 39.7  PLT 245 212   BMET  Recent Labs  05/14/17 1607 05/15/17 0252  NA 138 142  K 3.3* 4.1  CL 105 109  CO2 25 26  GLUCOSE 183* 98  BUN 33* 22*  CREATININE 1.19 0.96  CALCIUM 9.1 8.8*   PT/INR  Recent Labs  05/13/17 1845  LABPROT 14.0  INR 1.08   ABG  Recent Labs  05/13/17 1551  HCO3 26.5    Studies/Results: Ct Abdomen Pelvis Wo Contrast  Result Date: 05/13/2017 CLINICAL DATA:  Diffuse abdominal pain for several days EXAM: CT ABDOMEN AND PELVIS WITHOUT CONTRAST TECHNIQUE: Multidetector CT imaging of the abdomen and pelvis was performed following the standard protocol without IV contrast. COMPARISON:  10/24/2015 FINDINGS: Lower chest: Mild scarring is noted bilaterally. No focal infiltrate is seen. Small nodule is noted along the major fissure on the left stable from the prior exam. Some other previously seen nodules are not as well appreciated on today's exam. Hepatobiliary: The liver is within normal limits. The gallbladder is unremarkable. Pancreas: Unremarkable. No pancreatic ductal dilatation or surrounding inflammatory changes. Spleen: Normal in size without focal abnormality. Adrenals/Urinary Tract: Fullness of the left  adrenal gland is again seen and stable. The right adrenal gland is unremarkable. The kidneys demonstrate no renal calculi or obstructive changes. The bladder is partially distended. Stomach/Bowel: The stomach is significantly distended with fluid and food stuffs. This continues into the proximal small bowel with significant distention and fecalization of small bowel contents in the distal jejunum/ proximal ileum. The distal ileum is decompressed. The colon shows diverticular change. The appendix is within normal limits. A definitive transition zone is not appreciated similar to that noted on prior exam but lies deep within the pelvis. No definitive mass lesion is seen. Vascular/Lymphatic: No significant vascular findings are present. No enlarged abdominal or pelvic lymph nodes. Reproductive: Prostate is unremarkable. Other: No abdominal wall hernia or abnormality. No abdominopelvic ascites. Musculoskeletal: No acute or significant osseous findings. IMPRESSION: 1. Significant dilatation of the stomach, jejunum and likely proximal ileum related to an area within the pelvis seen on images 73-76 of series 3. This is consistent with a partial small bowel obstruction. The actual transition zone is not well appreciated. The overall appearance is similar to that seen on the prior exam although the degree of small bowel dilatation is increased. 2. Diverticulosis without diverticulitis. 3. Previously seen nodular densities in the lung bases are not as well appreciated Electronically Signed   By: Inez Catalina M.D.   On: 05/13/2017 15:06   Dg Chest 2 View  Result Date: 05/13/2017 CLINICAL DATA:  States developed nausea and emesis denies diarrhea one day ago and continued today with chest burning and abdominal pain. Pt also complaining of left shoulder pain radiating  into his lower back. EXAM: CHEST  2 VIEW COMPARISON:  10/24/2015 FINDINGS: The heart size and mediastinal contours are within normal limits. Both lungs are  clear. The visualized skeletal structures are unremarkable. IMPRESSION: No active cardiopulmonary disease. Electronically Signed   By: Kathreen Devoid   On: 05/13/2017 10:24   Dg Abd Portable 1v-small Bowel Obstruction Protocol-initial, 8 Hr Delay  Result Date: 05/14/2017 CLINICAL DATA:  61 year old male with small bowel obstruction. NG tube placed with Oral contrast within the stomach at 0630 hours today. EXAM: PORTABLE ABDOMEN - 1 VIEW COMPARISON:  0630 hours today and earlier. FINDINGS: Portable AP view at 1705 hours. Visible enteric tube appears stable with side hole up the level of the proximal gastric body. Moderate gaseous distension of the stomach persists. The oral contrast which was all within the stomach at 0630 hours today has now reached the rectum. There is a small volume of contrast in mildly dilated small bowel loops in the left abdomen. There is residual contrast throughout the colon. Persistent gas dilated left abdominal small bowel loops up to 46 mm. No acute osseous abnormality identified. IMPRESSION: 1. Partial small bowel obstruction. Oral contrast administered this morning has reached the rectum. 2. Continued dilated stomach and left abdominal small bowel loops. 3. Stable enteric tube, side hole to level of the proximal gastric body. Electronically Signed   By: Genevie Ann M.D.   On: 05/14/2017 17:36   Dg Abd Portable 1v-small Bowel Protocol-position Verification  Result Date: 05/14/2017 CLINICAL DATA:  NG tube placement. EXAM: PORTABLE ABDOMEN - 1 VIEW COMPARISON:  05/13/2017 . FINDINGS: NG tube noted with tip projected in the stomach. Radiopacities noted over the stomach may represent contrast. No gastric distention. Basilar atelectasis. Degenerative changes thoracolumbar spine. IMPRESSION: NG tube noted with tip in the stomach.  No gastric distention . Electronically Signed   By: Marcello Moores  Register   On: 05/14/2017 07:22   Dg Abd Portable 1 View  Result Date: 05/13/2017 CLINICAL DATA:   NG tube placement EXAM: PORTABLE ABDOMEN - 1 VIEW COMPARISON:  06/28/ 2018, 10/26/2015 FINDINGS: Low lung volumes with atelectasis or infiltrate at the left lung base. Esophageal tube tip projects over the mid gastric region, side-port over the proximal stomach. Partially visualized dilated small bowel in the left abdomen. Probable moderate residual gaseous dilatation of the stomach IMPRESSION: Esophageal tube tip overlies the mid stomach. Suspect that there is residual moderate gastric distention Electronically Signed   By: Donavan Foil M.D.   On: 05/13/2017 18:24    Anti-infectives: Anti-infectives    Start     Dose/Rate Route Frequency Ordered Stop   05/13/17 2200  piperacillin-tazobactam (ZOSYN) IVPB 3.375 g  Status:  Discontinued     3.375 g 12.5 mL/hr over 240 Minutes Intravenous Every 8 hours 05/13/17 1611 05/13/17 1654   05/13/17 1600  piperacillin-tazobactam (ZOSYN) IVPB 3.375 g  Status:  Discontinued     3.375 g 100 mL/hr over 30 Minutes Intravenous  Once 05/13/17 1549 05/13/17 1702      Assessment/Plan: DKA AKI DM  SBO vs ileus - no prior h/o abdominal surgery - 1 previous episode similar to this in 2016 that resolved without intervention - he has resolved this quickly so I am assuming this is ileus likely from metabolic derangements, I dont think needs surgery, will sign off, recommended he undergo colonoscopy as outpatient -can advance diet as tolerated VTE - SCDs, heparin   Jeremiah Bowman 05/15/2017

## 2017-05-16 DIAGNOSIS — IMO0002 Reserved for concepts with insufficient information to code with codable children: Secondary | ICD-10-CM

## 2017-05-16 DIAGNOSIS — E1165 Type 2 diabetes mellitus with hyperglycemia: Secondary | ICD-10-CM

## 2017-05-16 DIAGNOSIS — E876 Hypokalemia: Secondary | ICD-10-CM

## 2017-05-16 DIAGNOSIS — E118 Type 2 diabetes mellitus with unspecified complications: Secondary | ICD-10-CM

## 2017-05-16 LAB — BASIC METABOLIC PANEL
Anion gap: 6 (ref 5–15)
BUN: 12 mg/dL (ref 6–20)
CALCIUM: 8.5 mg/dL — AB (ref 8.9–10.3)
CO2: 25 mmol/L (ref 22–32)
Chloride: 107 mmol/L (ref 101–111)
Creatinine, Ser: 0.85 mg/dL (ref 0.61–1.24)
GFR calc non Af Amer: >60 mL/min (ref >60)
GLUCOSE: 118 mg/dL — AB (ref 65–99)
POTASSIUM: 3.3 mmol/L — AB (ref 3.5–5.1)
Sodium: 138 mmol/L (ref 135–145)

## 2017-05-16 LAB — GLUCOSE, CAPILLARY
GLUCOSE-CAPILLARY: 171 mg/dL — AB (ref 65–99)
Glucose-Capillary: 109 mg/dL — ABNORMAL HIGH (ref 65–99)

## 2017-05-16 LAB — MAGNESIUM: Magnesium: 2 mg/dL (ref 1.7–2.4)

## 2017-05-16 MED ORDER — "NEEDLE (DISP) 30G X 1/2"" MISC": 10.0000 [IU] | Freq: Every day | 0 refills | Status: AC

## 2017-05-16 MED ORDER — METOPROLOL TARTRATE 25 MG PO TABS: 25.0000 mg | ORAL_TABLET | Freq: Two times a day (BID) | ORAL | 0 refills | Status: DC

## 2017-05-16 MED ORDER — "PEN NEEDLES 1/2"" 29G X 12MM MISC": 10.0000 [IU] | Freq: Every day | 0 refills | Status: DC

## 2017-05-16 MED ORDER — INSULIN GLARGINE 100 UNITS/ML SOLOSTAR PEN: 10.0000 [IU] | PEN_INJECTOR | Freq: Every day | SUBCUTANEOUS | 0 refills | Status: DC

## 2017-05-16 MED ORDER — METOPROLOL TARTRATE 25 MG PO TABS
25.0000 mg | ORAL_TABLET | Freq: Two times a day (BID) | ORAL | Status: DC
  Administered 2017-05-16: 25 mg via ORAL
  Filled 2017-05-16: qty 1

## 2017-05-16 MED ORDER — METOCLOPRAMIDE HCL 5 MG PO TABS: 5.0000 mg | ORAL_TABLET | Freq: Three times a day (TID) | ORAL | 0 refills | Status: DC

## 2017-05-16 MED ORDER — INSULIN GLARGINE 100 UNIT/ML ~~LOC~~ SOLN: 10.0000 [IU] | Freq: Every day | SUBCUTANEOUS | 0 refills | Status: DC

## 2017-05-16 MED ORDER — POTASSIUM CHLORIDE CRYS ER 20 MEQ PO TBCR
50.0000 meq | EXTENDED_RELEASE_TABLET | Freq: Once | ORAL | Status: AC
  Administered 2017-05-16: 50 meq via ORAL
  Filled 2017-05-16: qty 2

## 2017-05-16 NOTE — Discharge Summary (Addendum)
Physician Discharge Summary  Jeremiah Bowman UVO:[id number] DOB: 20-Sep-1956 DOA: 05/13/2017  PCP: Everardo Beals, NP  Admit date: 05/13/2017 Discharge date: 05/16/2017  Time spent: 30 minutes  Recommendations for Outpatient Follow-up:   SIRS  -Patient needs criteria HR > 90, RR > 20. Doubt true infection. Stop antibiotics. Most likely secondary to DKA. -Resolved   DKA/Diabetes type 2 uncontrolled with complication/hyperglycemia diabetes type 2   -home Metformin 1000 mg  . -Diabetes coordinator consulted -6/29 Hemoglobin A1c= 10.4 -Lipid panel within ADA guidelines -Lantus 10 units daily: Given patient's poor control would discharge on insulin -Schedule follow-up one week NP Everardo Beals DKA, DM type II uncontrolled, acute renal failure, SBO  Small Bowel Obstruction  -Multifactorial to include previous SBO, diabetic gastroparesis. -Evaluated by general surgery who opined that if etiology is secondary to DKA should resolve quickly.  -Tolerating heart healthy/carb modified diet. Tolerated overnight stable for discharge  -Reglan 5 mg QAC: Most likely element of DM gastroparesis -Schedule establish care with [facility] 1-2 week for colonoscopy per surgery recommendation   Acute Renal failure -Most likely secondary to DKA -Hold ACE, HCTZ -Hold all nephrotoxic medication -Strict I&O since admission -6.5 L -Daily weight Filed Weights   05/13/17 0941  Weight: 215 lb (97.5 kg)    Hypokalemia -Potassium 50 mg Prior to discharge   Discharge Diagnoses:  Principal Problem:   Sepsis ([place]) Active Problems:   Abdominal pain   Nausea and vomiting   Diabetes mellitus ([facility])   HTN (hypertension)   Hyperlipidemia   Acute kidney injury ([place])   Lactic acidosis   Partial small bowel obstruction ([facility])   DKA (diabetic ketoacidoses) ([place])   Uncontrolled type 2 diabetes mellitus with complication ([place])   Hypokalemia   Discharge Condition: Stable  Diet recommendation: American  diabetic Association  Filed Weights   05/13/17 0941  Weight: 215 lb (97.5 kg)    History of present illness:  61 y.o. [place] his lived here for 79 yearsPMHx DM Type 2 uncontrolled with complication, HTN, HLD, AKI, SBO.   Presents to the emergency department with chief complaint 4 day history of persistent abdominal pain nausea and vomiting. Initial evaluation includes CT of the abdomen and pelvis concerning for partial small bowel obstruction and also reveals DKA  Information is obtained from the patient. He states he was in his usual state of health until about 4 days ago he developed gradual worsening nausea and vomiting. Associated symptoms include worsening distention, constipation shortness of breath and reflux. He also reports increased thirst and urination. He states he takes metformin for diabetes but admits to not managing/monitoring his blood sugar. He denies headache dizziness syncope or near-syncope. He denies chest pain palpitations lower extremity edema dysuria hematuria frequency or urgency. He denies fever chills cough. During his hospital position patient was treated for SBO, DKA/diabetes type 2 uncontrolled with complication. Patient responded well to fluids and glucose stabilizer protocol. Patient was counseled on his noncompliance with medication and diet and the sequela of continuing to not comply to include increased risk of MI, stroke, death.        Consultations: Surgery Dr. Rolm Bookbinder    Cultures 6/28 blood NGTD 6/28 MRSA by PCR negative   Antibiotics Anti-infectives    Start     Stop   05/13/17 2200  piperacillin-tazobactam (ZOSYN) IVPB 3.375 g  Status:  Discontinued     05/13/17 1654   05/13/17 1600  piperacillin-tazobactam (ZOSYN) IVPB 3.375 g  Status:  Discontinued  05/13/17 1702       Discharge Exam: Vitals:   05/16/17 0335 05/16/17 0400 05/16/17 0806 05/16/17 1200  BP: 139/79 120/67 (!) 141/89 119/73  Pulse: 82 73 82 94   Resp: 20  (!) 21 (!) 25  Temp: 98.1 F (36.7 C)  98.5 F (36.9 C) 98.8 F (37.1 C)  TempSrc: Oral  Oral Oral  SpO2: 97% 97% 96% 99%  Weight:      Height:        General: A/O 4, No acute respiratory distress, Sitting in chair comfortably Eyes: negative scleral hemorrhage, negative anisocoria, negative icterus ENT: Negative Runny nose, negative gingival bleeding, NG tube present draining bilious fluid  Neck:  Negative scars, masses, torticollis, lymphadenopathy, JVD Lungs: Clear to auscultation bilaterally without wheezes or crackles Cardiovascular: Regular rate and rhythm without murmur gallop or rub normal S1 and S2 Abdomen: Negative  abdominal pain,positive distention, soft,, positive soft, bowel sounds, no rebound, no ascites, no appreciable mass    Discharge Instructions   Allergies as of 05/16/2017   No Known Allergies     Medication List    STOP taking these medications   lisinopril-hydrochlorothiazide 20-12.5 MG tablet Commonly known as:  PRINZIDE,ZESTORETIC   verapamil 180 MG CR tablet Commonly known as:  CALAN-SR     TAKE these medications   ASPERCREME W/LIDOCAINE 4 % cream Generic drug:  lidocaine Apply 1 application topically daily as needed (back pain).   aspirin 81 MG chewable tablet Chew 81 mg by mouth daily.   cyclobenzaprine 5 MG tablet Commonly known as:  FLEXERIL Take 5 mg by mouth 3 (three) times daily as needed for muscle spasms.   insulin glargine 100 UNIT/ML injection Commonly known as:  LANTUS Inject 0.1 mLs (10 Units total) into the skin daily.   metFORMIN 1000 MG tablet Commonly known as:  GLUCOPHAGE Take 1,000 mg by mouth 2 (two) times daily with a meal.   metoCLOPramide 5 MG tablet Commonly known as:  REGLAN Take 1 tablet (5 mg total) by mouth 3 (three) times daily before meals.   metoprolol tartrate 25 MG tablet Commonly known as:  LOPRESSOR Take 1 tablet (25 mg total) by mouth 2 (two) times daily.   naproxen 500 MG  tablet Commonly known as:  NAPROSYN Take 500 mg by mouth 2 (two) times daily with a meal.   NEEDLE (DISP) 30 G 30G X 1/2" Misc Commonly known as:  BD DISP NEEDLES 10 Units by Does not apply route daily.   oxybutynin 5 MG 24 hr tablet Commonly known as:  DITROPAN-XL Take 5 mg by mouth 2 (two) times daily.   pravastatin 20 MG tablet Commonly known as:  PRAVACHOL Take 20 mg by mouth daily.   ranitidine 150 MG tablet Commonly known as:  ZANTAC Take 150 mg by mouth daily as needed for heartburn.      No Known Allergies Follow-up Information    [facility]. Schedule an appointment as soon as possible for a visit in 2 week(s).   Why:  Schedule establish care with [facility] 1-2 week for colonoscopy per surgery recommendation       Everardo Beals, NP. Schedule an appointment as soon as possible for a visit in 1 week(s).   Why:  Schedule follow-up one week NP Everardo Beals DKA, DM type II uncontrolled, acute renal failure, SBO  Contact information: [facility] [address] [place] [address] [phone]  The results of significant diagnostics from this hospitalization (including imaging, microbiology, ancillary and laboratory) are listed below for reference.    Significant Diagnostic Studies: Ct Abdomen Pelvis Wo Contrast  Result Date: 05/13/2017 CLINICAL DATA:  Diffuse abdominal pain for several days EXAM: CT ABDOMEN AND PELVIS WITHOUT CONTRAST TECHNIQUE: Multidetector CT imaging of the abdomen and pelvis was performed following the standard protocol without IV contrast. COMPARISON:  10/24/2015 FINDINGS: Lower chest: Mild scarring is noted bilaterally. No focal infiltrate is seen. Small nodule is noted along the major fissure on the left stable from the prior exam. Some other previously seen nodules are not as well appreciated on today's exam. Hepatobiliary: The liver is within normal limits. The gallbladder is  unremarkable. Pancreas: Unremarkable. No pancreatic ductal dilatation or surrounding inflammatory changes. Spleen: Normal in size without focal abnormality. Adrenals/Urinary Tract: Fullness of the left adrenal gland is again seen and stable. The right adrenal gland is unremarkable. The kidneys demonstrate no renal calculi or obstructive changes. The bladder is partially distended. Stomach/Bowel: The stomach is significantly distended with fluid and food stuffs. This continues into the proximal small bowel with significant distention and fecalization of small bowel contents in the distal jejunum/ proximal ileum. The distal ileum is decompressed. The colon shows diverticular change. The appendix is within normal limits. A definitive transition zone is not appreciated similar to that noted on prior exam but lies deep within the pelvis. No definitive mass lesion is seen. Vascular/Lymphatic: No significant vascular findings are present. No enlarged abdominal or pelvic lymph nodes. Reproductive: Prostate is unremarkable. Other: No abdominal wall hernia or abnormality. No abdominopelvic ascites. Musculoskeletal: No acute or significant osseous findings. IMPRESSION: 1. Significant dilatation of the stomach, jejunum and likely proximal ileum related to an area within the pelvis seen on images 73-76 of series 3. This is consistent with a partial small bowel obstruction. The actual transition zone is not well appreciated. The overall appearance is similar to that seen on the prior exam although the degree of small bowel dilatation is increased. 2. Diverticulosis without diverticulitis. 3. Previously seen nodular densities in the lung bases are not as well appreciated Electronically Signed   By: Inez Catalina M.D.   On: 05/13/2017 15:06   Dg Chest 2 View  Result Date: 05/13/2017 CLINICAL DATA:  States developed nausea and emesis denies diarrhea one day ago and continued today with chest burning and abdominal pain. Pt also  complaining of left shoulder pain radiating into his lower back. EXAM: CHEST  2 VIEW COMPARISON:  10/24/2015 FINDINGS: The heart size and mediastinal contours are within normal limits. Both lungs are clear. The visualized skeletal structures are unremarkable. IMPRESSION: No active cardiopulmonary disease. Electronically Signed   By: Kathreen Devoid   On: 05/13/2017 10:24   Dg Abd Portable 1v-small Bowel Obstruction Protocol-initial, 8 Hr Delay  Result Date: 05/14/2017 CLINICAL DATA:  61 year old male with small bowel obstruction. NG tube placed with Oral contrast within the stomach at 0630 hours today. EXAM: PORTABLE ABDOMEN - 1 VIEW COMPARISON:  0630 hours today and earlier. FINDINGS: Portable AP view at 1705 hours. Visible enteric tube appears stable with side hole up the level of the proximal gastric body. Moderate gaseous distension of the stomach persists. The oral contrast which was all within the stomach at 0630 hours today has now reached the rectum. There is a small volume of contrast in mildly dilated small bowel loops in the left abdomen. There is residual contrast throughout the colon. Persistent gas dilated left  abdominal small bowel loops up to 46 mm. No acute osseous abnormality identified. IMPRESSION: 1. Partial small bowel obstruction. Oral contrast administered this morning has reached the rectum. 2. Continued dilated stomach and left abdominal small bowel loops. 3. Stable enteric tube, side hole to level of the proximal gastric body. Electronically Signed   By: Genevie Ann M.D.   On: 05/14/2017 17:36   Dg Abd Portable 1v-small Bowel Protocol-position Verification  Result Date: 05/14/2017 CLINICAL DATA:  NG tube placement. EXAM: PORTABLE ABDOMEN - 1 VIEW COMPARISON:  05/13/2017 . FINDINGS: NG tube noted with tip projected in the stomach. Radiopacities noted over the stomach may represent contrast. No gastric distention. Basilar atelectasis. Degenerative changes thoracolumbar spine. IMPRESSION: NG  tube noted with tip in the stomach.  No gastric distention . Electronically Signed   By: Marcello Moores  Register   On: 05/14/2017 07:22   Dg Abd Portable 1 View  Result Date: 05/13/2017 CLINICAL DATA:  NG tube placement EXAM: PORTABLE ABDOMEN - 1 VIEW COMPARISON:  06/28/ 2018, 10/26/2015 FINDINGS: Low lung volumes with atelectasis or infiltrate at the left lung base. Esophageal tube tip projects over the mid gastric region, side-port over the proximal stomach. Partially visualized dilated small bowel in the left abdomen. Probable moderate residual gaseous dilatation of the stomach IMPRESSION: Esophageal tube tip overlies the mid stomach. Suspect that there is residual moderate gastric distention Electronically Signed   By: Donavan Foil M.D.   On: 05/13/2017 18:24    Microbiology: Recent Results (from the past 240 hour(s))  Culture, blood (x 2)     Status: None (Preliminary result)   Collection Time: 05/13/17  6:50 PM  Result Value Ref Range Status   Specimen Description BLOOD RIGHT ANTECUBITAL  Final   Special Requests   Final    BOTTLES DRAWN AEROBIC ONLY Blood Culture adequate volume   Culture NO GROWTH 2 DAYS  Final   Report Status PENDING  Incomplete  Culture, blood (routine x 2)     Status: None (Preliminary result)   Collection Time: 05/13/17  6:55 PM  Result Value Ref Range Status   Specimen Description BLOOD RIGHT HAND  Final   Special Requests   Final    BOTTLES DRAWN AEROBIC ONLY Blood Culture adequate volume   Culture NO GROWTH 2 DAYS  Final   Report Status PENDING  Incomplete  MRSA PCR Screening     Status: None   Collection Time: 05/13/17  9:40 PM  Result Value Ref Range Status   MRSA by PCR NEGATIVE NEGATIVE Final    Comment:        The GeneXpert MRSA Assay (FDA approved for NASAL specimens only), is one component of a comprehensive MRSA colonization surveillance program. It is not intended to diagnose MRSA infection nor to guide or monitor treatment for MRSA  infections.      Labs: Basic Metabolic Panel:  Recent Labs Lab 05/13/17 1946 05/14/17 0658 05/14/17 1607 05/15/17 0252 05/16/17 0308  NA 137 139 138 142 138  K 4.3 3.6 3.3* 4.1 3.3*  CL 103 103 105 109 107  CO2 23 23 25 26 25   GLUCOSE 171* 239* 183* 98 118*  BUN 47* 43* 33* 22* 12  CREATININE 2.37* 1.86* 1.19 0.96 0.85  CALCIUM 8.6* 9.0 9.1 8.8* 8.5*  MG  --   --   --  2.2 2.0   Liver Function Tests:  Recent Labs Lab 05/13/17 1007 05/13/17 1845  AST 29 26  ALT 30 25  ALKPHOS 78  62  BILITOT 1.4* 1.0  PROT 7.8 6.7  ALBUMIN 4.2 3.4*    Recent Labs Lab 05/13/17 1007  LIPASE 27   No results for input(s): AMMONIA in the last 168 hours. CBC:  Recent Labs Lab 05/13/17 1007 05/13/17 1845 05/14/17 0658  WBC 15.8* 14.5* 10.3  NEUTROABS  --  10.5*  --   HGB 16.0 14.9 13.5  HCT 46.4 42.9 39.7  MCV 86.1 85.5 85.6  PLT 278 245 212   Cardiac Enzymes: No results for input(s): CKTOTAL, CKMB, CKMBINDEX, TROPONINI in the last 168 hours. BNP: BNP (last 3 results) No results for input(s): BNP in the last 8760 hours.  ProBNP (last 3 results) No results for input(s): PROBNP in the last 8760 hours.  CBG:  Recent Labs Lab 05/15/17 0814 05/15/17 1709 05/15/17 2027 05/16/17 0805 05/16/17 1219  GLUCAP 122* 122* 165* 109* 171*       Signed:  Dia Crawford, MD [facility] [phone] pager

## 2017-05-16 NOTE — Progress Notes (Signed)
Spoke to Dr Sherral Hammers re insulins. He will write for Lantus vials so [place] letter can be used. MATCH tubed to 4NP, nurse Tammi aware to give to patient along w new Rx for vials. No other CM needs.

## 2017-05-18 LAB — CULTURE, BLOOD (ROUTINE X 2)
Culture: NO GROWTH
Culture: NO GROWTH
SPECIAL REQUESTS: ADEQUATE
Special Requests: ADEQUATE

## 2017-06-24 ENCOUNTER — Other Ambulatory Visit: Payer: Self-pay | Admitting: Orthopedic Surgery

## 2017-06-24 DIAGNOSIS — M545 Low back pain: Secondary | ICD-10-CM

## 2017-07-01 ENCOUNTER — Encounter: Payer: Self-pay | Admitting: Physician Assistant

## 2017-07-04 ENCOUNTER — Other Ambulatory Visit: Payer: Self-pay

## 2017-07-05 ENCOUNTER — Other Ambulatory Visit: Payer: Self-pay

## 2017-07-07 ENCOUNTER — Other Ambulatory Visit: Payer: Self-pay

## 2017-07-14 ENCOUNTER — Ambulatory Visit: Payer: Self-pay | Admitting: Physician Assistant

## 2017-07-20 ENCOUNTER — Other Ambulatory Visit: Payer: Self-pay | Admitting: Orthopedic Surgery

## 2017-07-20 DIAGNOSIS — M48061 Spinal stenosis, lumbar region without neurogenic claudication: Secondary | ICD-10-CM

## 2017-07-28 ENCOUNTER — Ambulatory Visit
Admission: RE | Admit: 2017-07-28 | Discharge: 2017-07-28 | Disposition: A | Discharge status: Home / Self Care | Payer: No Typology Code available for payment source | Source: Ambulatory Visit | Attending: Orthopedic Surgery | Admitting: Orthopedic Surgery

## 2017-07-28 DIAGNOSIS — M48061 Spinal stenosis, lumbar region without neurogenic claudication: Secondary | ICD-10-CM

## 2017-07-28 MED ORDER — METHYLPREDNISOLONE ACETATE 40 MG/ML INJ SUSP (RADIOLOG
120.0000 mg | Freq: Once | INTRAMUSCULAR | Status: AC
  Administered 2017-07-28: 120 mg via EPIDURAL

## 2017-07-28 MED ORDER — IOPAMIDOL (ISOVUE-M 200) INJECTION 41%
1.0000 mL | Freq: Once | INTRAMUSCULAR | Status: AC
  Administered 2017-07-28: 1 mL via EPIDURAL

## 2017-07-28 NOTE — Discharge Instructions (Signed)

## 2017-10-18 ENCOUNTER — Other Ambulatory Visit: Payer: Self-pay

## 2017-10-18 ENCOUNTER — Emergency Department (HOSPITAL_COMMUNITY): Payer: Self-pay

## 2017-10-18 ENCOUNTER — Encounter (HOSPITAL_COMMUNITY): Payer: Self-pay | Admitting: *Deleted

## 2017-10-18 ENCOUNTER — Inpatient Hospital Stay (HOSPITAL_COMMUNITY)
Admission: EM | Admit: 2017-10-18 | Discharge: 2017-10-21 | DRG: 388 | Disposition: A | Discharge status: Home / Self Care | Payer: Self-pay | Attending: Internal Medicine | Admitting: Internal Medicine

## 2017-10-18 DIAGNOSIS — R0602 Shortness of breath: Secondary | ICD-10-CM

## 2017-10-18 DIAGNOSIS — Z79899 Other long term (current) drug therapy: Secondary | ICD-10-CM

## 2017-10-18 DIAGNOSIS — R0789 Other chest pain: Secondary | ICD-10-CM | POA: Diagnosis present

## 2017-10-18 DIAGNOSIS — Z7984 Long term (current) use of oral hypoglycemic drugs: Secondary | ICD-10-CM

## 2017-10-18 DIAGNOSIS — K2210 Ulcer of esophagus without bleeding: Secondary | ICD-10-CM | POA: Diagnosis present

## 2017-10-18 DIAGNOSIS — K209 Esophagitis, unspecified without bleeding: Secondary | ICD-10-CM

## 2017-10-18 DIAGNOSIS — E860 Dehydration: Secondary | ICD-10-CM | POA: Diagnosis present

## 2017-10-18 DIAGNOSIS — K921 Melena: Secondary | ICD-10-CM | POA: Diagnosis present

## 2017-10-18 DIAGNOSIS — E1110 Type 2 diabetes mellitus with ketoacidosis without coma: Secondary | ICD-10-CM | POA: Diagnosis present

## 2017-10-18 DIAGNOSIS — K565 Intestinal adhesions [bands], unspecified as to partial versus complete obstruction: Principal | ICD-10-CM | POA: Diagnosis present

## 2017-10-18 DIAGNOSIS — Z6828 Body mass index (BMI) 28.0-28.9, adult: Secondary | ICD-10-CM

## 2017-10-18 DIAGNOSIS — Z7982 Long term (current) use of aspirin: Secondary | ICD-10-CM

## 2017-10-18 DIAGNOSIS — N179 Acute kidney failure, unspecified: Secondary | ICD-10-CM | POA: Diagnosis present

## 2017-10-18 DIAGNOSIS — R066 Hiccough: Secondary | ICD-10-CM | POA: Diagnosis present

## 2017-10-18 DIAGNOSIS — I471 Supraventricular tachycardia: Secondary | ICD-10-CM | POA: Diagnosis present

## 2017-10-18 DIAGNOSIS — E872 Acidosis: Secondary | ICD-10-CM | POA: Diagnosis present

## 2017-10-18 DIAGNOSIS — Z791 Long term (current) use of non-steroidal anti-inflammatories (NSAID): Secondary | ICD-10-CM

## 2017-10-18 DIAGNOSIS — Z888 Allergy status to other drugs, medicaments and biological substances status: Secondary | ICD-10-CM

## 2017-10-18 DIAGNOSIS — E876 Hypokalemia: Secondary | ICD-10-CM | POA: Diagnosis not present

## 2017-10-18 DIAGNOSIS — K5790 Diverticulosis of intestine, part unspecified, without perforation or abscess without bleeding: Secondary | ICD-10-CM | POA: Diagnosis present

## 2017-10-18 DIAGNOSIS — R402414 Glasgow coma scale score 13-15, 24 hours or more after hospital admission: Secondary | ICD-10-CM | POA: Diagnosis present

## 2017-10-18 DIAGNOSIS — E785 Hyperlipidemia, unspecified: Secondary | ICD-10-CM | POA: Diagnosis present

## 2017-10-18 DIAGNOSIS — I10 Essential (primary) hypertension: Secondary | ICD-10-CM | POA: Diagnosis present

## 2017-10-18 DIAGNOSIS — R6510 Systemic inflammatory response syndrome (SIRS) of non-infectious origin without acute organ dysfunction: Secondary | ICD-10-CM | POA: Diagnosis present

## 2017-10-18 DIAGNOSIS — R634 Abnormal weight loss: Secondary | ICD-10-CM | POA: Diagnosis present

## 2017-10-18 DIAGNOSIS — K219 Gastro-esophageal reflux disease without esophagitis: Secondary | ICD-10-CM | POA: Diagnosis present

## 2017-10-18 DIAGNOSIS — D72829 Elevated white blood cell count, unspecified: Secondary | ICD-10-CM | POA: Diagnosis present

## 2017-10-18 DIAGNOSIS — K56609 Unspecified intestinal obstruction, unspecified as to partial versus complete obstruction: Secondary | ICD-10-CM

## 2017-10-18 DIAGNOSIS — D649 Anemia, unspecified: Secondary | ICD-10-CM | POA: Diagnosis present

## 2017-10-18 DIAGNOSIS — Z87891 Personal history of nicotine dependence: Secondary | ICD-10-CM

## 2017-10-18 DIAGNOSIS — Z8719 Personal history of other diseases of the digestive system: Secondary | ICD-10-CM

## 2017-10-18 DIAGNOSIS — Z794 Long term (current) use of insulin: Secondary | ICD-10-CM

## 2017-10-18 LAB — COMPREHENSIVE METABOLIC PANEL
ALBUMIN: 4.5 g/dL (ref 3.5–5.0)
ALK PHOS: 66 U/L (ref 38–126)
ALT: 14 U/L — AB (ref 17–63)
AST: 27 U/L (ref 15–41)
Anion gap: 20 — ABNORMAL HIGH (ref 5–15)
BILIRUBIN TOTAL: 1.1 mg/dL (ref 0.3–1.2)
BUN: 40 mg/dL — AB (ref 6–20)
CALCIUM: 10.4 mg/dL — AB (ref 8.9–10.3)
CO2: 24 mmol/L (ref 22–32)
CREATININE: 2.31 mg/dL — AB (ref 0.61–1.24)
Chloride: 95 mmol/L — ABNORMAL LOW (ref 101–111)
GFR calc Af Amer: 33 mL/min — ABNORMAL LOW (ref >60)
GFR calc non Af Amer: 29 mL/min — ABNORMAL LOW (ref >60)
GLUCOSE: 315 mg/dL — AB (ref 65–99)
Potassium: 4 mmol/L (ref 3.5–5.1)
SODIUM: 139 mmol/L (ref 135–145)
TOTAL PROTEIN: 8.6 g/dL — AB (ref 6.5–8.1)

## 2017-10-18 LAB — URINALYSIS, ROUTINE W REFLEX MICROSCOPIC
BACTERIA UA: NONE SEEN
Glucose, UA: NEGATIVE mg/dL
Hgb urine dipstick: NEGATIVE
KETONES UR: 5 mg/dL — AB
Nitrite: NEGATIVE
PH: 5 (ref 5.0–8.0)
Protein, ur: 30 mg/dL — AB
Specific Gravity, Urine: 1.02 (ref 1.005–1.030)

## 2017-10-18 LAB — ETHANOL

## 2017-10-18 LAB — LACTIC ACID, PLASMA
LACTIC ACID, VENOUS: 3.8 mmol/L — AB (ref 0.5–1.9)
Lactic Acid, Venous: 4.9 mmol/L (ref 0.5–1.9)
Lactic Acid, Venous: 4.1 mmol/L (ref 0.5–1.9)

## 2017-10-18 LAB — CBC
HCT: 45 % (ref 39.0–52.0)
Hemoglobin: 15.5 g/dL (ref 13.0–17.0)
MCH: 29.5 pg (ref 26.0–34.0)
MCHC: 34.4 g/dL (ref 30.0–36.0)
MCV: 85.7 fL (ref 78.0–100.0)
PLATELETS: 332 10*3/uL (ref 150–400)
RBC: 5.25 MIL/uL (ref 4.22–5.81)
RDW: 13.9 % (ref 11.5–15.5)
WBC: 15 10*3/uL — ABNORMAL HIGH (ref 4.0–10.5)

## 2017-10-18 LAB — TYPE AND SCREEN
ABO/RH(D): A POS
Antibody Screen: NEGATIVE

## 2017-10-18 LAB — SALICYLATE LEVEL

## 2017-10-18 LAB — SODIUM, URINE, RANDOM

## 2017-10-18 LAB — CREATININE, URINE, RANDOM: Creatinine, Urine: 223.93 mg/dL

## 2017-10-18 LAB — CBG MONITORING, ED: Glucose-Capillary: 200 mg/dL — ABNORMAL HIGH (ref 65–99)

## 2017-10-18 MED ORDER — PROMETHAZINE HCL 25 MG/ML IJ SOLN
12.5000 mg | INTRAMUSCULAR | Status: DC | PRN
  Administered 2017-10-18: 12.5 mg via INTRAVENOUS
  Filled 2017-10-18: qty 1

## 2017-10-18 MED ORDER — SODIUM CHLORIDE 0.9 % IV BOLUS (SEPSIS)
1000.0000 mL | Freq: Once | INTRAVENOUS | Status: AC
  Administered 2017-10-18: 1000 mL via INTRAVENOUS

## 2017-10-18 MED ORDER — DIATRIZOATE MEGLUMINE & SODIUM 66-10 % PO SOLN
90.0000 mL | Freq: Once | ORAL | Status: AC
  Administered 2017-10-18: 90 mL via NASOGASTRIC
  Filled 2017-10-18: qty 90

## 2017-10-18 MED ORDER — BACLOFEN 5 MG HALF TABLET: 5.0000 mg | ORAL_TABLET | Freq: Once | ORAL | Status: DC

## 2017-10-18 MED ORDER — ACETAMINOPHEN 325 MG PO TABS: 650.0000 mg | ORAL_TABLET | Freq: Four times a day (QID) | ORAL | Status: DC | PRN

## 2017-10-18 MED ORDER — VITAMIN B-1 100 MG PO TABS
100.0000 mg | ORAL_TABLET | Freq: Every day | ORAL | Status: DC
  Administered 2017-10-20 – 2017-10-21 (×2): 100 mg via ORAL
  Filled 2017-10-18 (×3): qty 1

## 2017-10-18 MED ORDER — LORAZEPAM 2 MG/ML IJ SOLN: 0.0000 mg | Freq: Two times a day (BID) | INTRAMUSCULAR | Status: DC

## 2017-10-18 MED ORDER — THIAMINE HCL 100 MG/ML IJ SOLN
100.0000 mg | Freq: Every day | INTRAMUSCULAR | Status: DC
  Administered 2017-10-19: 100 mg via INTRAVENOUS
  Filled 2017-10-18: qty 2

## 2017-10-18 MED ORDER — HYDROMORPHONE HCL 1 MG/ML IJ SOLN
1.0000 mg | Freq: Once | INTRAMUSCULAR | Status: AC
  Administered 2017-10-18: 1 mg via INTRAVENOUS
  Filled 2017-10-18: qty 1

## 2017-10-18 MED ORDER — LACTATED RINGERS IV SOLN
INTRAVENOUS | Status: AC
  Administered 2017-10-18: 18:00:00 via INTRAVENOUS

## 2017-10-18 MED ORDER — SENNOSIDES-DOCUSATE SODIUM 8.6-50 MG PO TABS: 1.0000 | ORAL_TABLET | Freq: Every evening | ORAL | Status: DC | PRN

## 2017-10-18 MED ORDER — ACETAMINOPHEN 650 MG RE SUPP
650.0000 mg | Freq: Four times a day (QID) | RECTAL | Status: DC | PRN
Start: 2017-10-18 — End: 2017-10-21
  Administered 2017-10-18: 650 mg via RECTAL
  Filled 2017-10-18: qty 1

## 2017-10-18 MED ORDER — ONDANSETRON HCL 4 MG/2ML IJ SOLN
4.0000 mg | Freq: Four times a day (QID) | INTRAMUSCULAR | Status: DC | PRN
  Administered 2017-10-18: 4 mg via INTRAVENOUS

## 2017-10-18 MED ORDER — INSULIN ASPART 100 UNIT/ML ~~LOC~~ SOLN
0.0000 [IU] | Freq: Three times a day (TID) | SUBCUTANEOUS | Status: DC
  Administered 2017-10-18: 3 [IU] via SUBCUTANEOUS
  Administered 2017-10-19: 5 [IU] via SUBCUTANEOUS
  Administered 2017-10-19: 3 [IU] via SUBCUTANEOUS
  Administered 2017-10-20 – 2017-10-21 (×2): 2 [IU] via SUBCUTANEOUS
  Filled 2017-10-18: qty 1

## 2017-10-18 MED ORDER — LORAZEPAM 2 MG/ML IJ SOLN: 0.0000 mg | Freq: Four times a day (QID) | INTRAMUSCULAR | Status: AC

## 2017-10-18 NOTE — ED Triage Notes (Signed)
PT states constipation and chest pain since Sat and he began vomiting up "vomit that looks like coffee/black".

## 2017-10-18 NOTE — Progress Notes (Signed)
CRITICAL VALUE ALERT  Critical Value:  Lactic Acid: 4.1  Date & Time Notied:  10/18/17 2326  Provider Notified: Johny Chess   Orders Received/Actions taken:  1 Liter bolus of NS.  Repeat lactic acid Q3H.  Continue maintenance fluids

## 2017-10-18 NOTE — ED Provider Notes (Signed)
[facility] EMERGENCY DEPARTMENT Provider Note   CSN: [id number] Arrival date & time: 10/18/17  4270  History   Chief Complaint Chief Complaint  Patient presents with  . GI Bleeding  . Shortness of Breath   HPI Jeremiah Bowman is a 61 y.o. male with a PMHx significant for DM, recurrent SBO, and HTN who presented with nausea/vomiting, abdominal distention, and decreased PO intake of 1 day duration. He has had multiple episodes of coffee ground emesis since 12/2 and states this is what happened before his last small bowel obstructions. Denies excessive retching prior to coffee ground emesis. Denies excessive use of NSAIDs, or a history of cirrhosis. He does drink 2 shots of Brandy per day. Endorses increased diffuse abdominal pain and decrease in stool output and flatulence.   He was admitted in December of 2016 and July of 2018 for multifactorial SBO that was managed medically both times. After his July SBO he was supposed to follow-up with GI for endoscopy and colonoscopy but failed to do so. He states he has never had a colonoscopy. He denies multiple abdominal surgeries.   Past Medical History:  Diagnosis Date  . Acute kidney injury ([place])   . Diabetes ([place])   . DKA (diabetic ketoacidoses) ([place])   . Hyperlipidemia   . Hypertension   . Small bowel obstruction ([place]) 10/2015   Patient Active Problem List   Diagnosis Date Noted  . Uncontrolled type 2 diabetes mellitus with complication ([place])   . Hypokalemia   . DKA (diabetic ketoacidoses) ([place]) 05/13/2017  . Dehydration   . Diabetes mellitus with complication ([place])   . Partial small bowel obstruction ([employer])   . SBO (small bowel obstruction) ([place])   . Abdominal pain 10/24/2015  . Nausea and vomiting 10/24/2015  . Small bowel obstruction ([place]) 10/24/2015  . Severe sepsis ([place]) 10/24/2015  . Alcohol dependency ([place]) 10/24/2015  . Diabetes mellitus ([place]) 10/24/2015  . HTN (hypertension) 10/24/2015  . Hyperlipidemia  10/24/2015  . Acute urinary retention 10/24/2015  . Acute renal failure (ARF) ([place]) 10/24/2015  . Sepsis ([place]) 10/24/2015  . Acute kidney injury ([place])   . Lactic acidosis    Past Surgical History:  Procedure Laterality Date  . TONSILLECTOMY     patient denies  . WRIST SURGERY      Home Medications    Prior to Admission medications   Medication Sig Start Date End Date Taking? Authorizing Provider  aspirin 81 MG chewable tablet Chew 81 mg by mouth daily.   Yes [provider]  cyclobenzaprine (FLEXERIL) 5 MG tablet Take 5 mg by mouth 3 (three) times daily as needed for muscle spasms.   Yes [provider]  insulin glargine (LANTUS) 100 UNIT/ML injection Inject 0.1 mLs (10 Units total) into the skin daily. 05/16/17 05/16/18 Yes Allie Bossier, MD  metFORMIN (GLUCOPHAGE) 1000 MG tablet Take 1,000 mg by mouth 2 (two) times daily with a meal.   Yes [provider]  metoprolol tartrate (LOPRESSOR) 25 MG tablet Take 1 tablet (25 mg total) by mouth 2 (two) times daily. 05/16/17  Yes Allie Bossier, MD  oxybutynin (DITROPAN-XL) 5 MG 24 hr tablet Take 5 mg by mouth 2 (two) times daily.   Yes [provider]  pravastatin (PRAVACHOL) 20 MG tablet Take 20 mg by mouth daily.   Yes [provider]  ranitidine (ZANTAC) 75 MG tablet Take 75 mg by mouth as needed for heartburn.   Yes [provider]  metoCLOPramide (  REGLAN) 5 MG tablet Take 1 tablet (5 mg total) by mouth 3 (three) times daily before meals. Patient not taking: Reported on 10/18/2017 05/16/17   Allie Bossier, MD  naproxen (NAPROSYN) 500 MG tablet Take 500 mg by mouth 2 (two) times daily with a meal.    [provider]  NEEDLE, DISP, 30 G (BD DISP NEEDLES) 30G X 1/2" MISC 10 Units by Does not apply route daily. 05/16/17   Allie Bossier, MD  ranitidine (ZANTAC) 150 MG tablet Take 150 mg by mouth daily as needed for heartburn.    [provider]   Family History Family  History  Problem Relation Age of Onset  . Hypertension Mother   . Hypertension Father    Social History Social History   Tobacco Use  . Smoking status: Former Research scientist (life sciences)  . Smokeless tobacco: Never Used  Substance Use Topics  . Alcohol use: Yes    Alcohol/week: 9.6 oz    Types: 2 Cans of beer, 14 Shots of liquor per week  . Drug use: No   Allergies   Malarone [atovaquone-proguanil hcl]  Review of Systems  All systems reviewed and are negative for acute change except as noted in the HPI.  Physical Exam Updated Vital Signs BP 107/60   Pulse (!) 138   Temp 97.9 F (36.6 C) (Oral)   Resp (!) 37   Ht 5\' 8"  (1.727 m)   Wt 86.2 kg (190 lb)   SpO2 96%   BMI 28.89 kg/m   General: Obese male in no acute distress HENT: Atraumatic, normocephalic, moist mucus membranes  Pulm: Good air movement with no wheezing or crackles  CV: RRR, no murmurs, no rubs  Abdomen: Hypoactive bowel sounds, firm, distended, no tenderness to palpation  Extremities: No LE edema, radial pulses palpable bilaterally  Skin: No new rashes  Neuro: Alert and oriented x 3  EKG: Sinus tachycardia with normal axis. PR prolongation.   ED Treatments / Results  Labs (all labs ordered are listed, but only abnormal results are displayed) Labs Reviewed  COMPREHENSIVE METABOLIC PANEL - Abnormal; Notable for the following components:      Result Value   Chloride 95 (*)    Glucose, Bld 315 (*)    BUN 40 (*)    Creatinine, Ser 2.31 (*)    Calcium 10.4 (*)    Total Protein 8.6 (*)    ALT 14 (*)    GFR calc non Af Amer 29 (*)    GFR calc Af Amer 33 (*)    Anion gap 20 (*)    All other components within normal limits  CBC - Abnormal; Notable for the following components:   WBC 15.0 (*)    All other components within normal limits  LACTIC ACID, PLASMA - Abnormal; Notable for the following components:   Lactic Acid, Venous 4.9 (*)    All other components within normal limits  URINALYSIS, ROUTINE W REFLEX  MICROSCOPIC - Abnormal; Notable for the following components:   APPearance HAZY (*)    Bilirubin Urine SMALL (*)    Ketones, ur 5 (*)    Protein, ur 30 (*)    Leukocytes, UA SMALL (*)    Squamous Epithelial / LPF 6-30 (*)    All other components within normal limits  SALICYLATE LEVEL  ETHANOL  SODIUM, URINE, RANDOM  CREATININE, URINE, RANDOM  LACTIC ACID, PLASMA  LACTIC ACID, PLASMA  POC OCCULT BLOOD, ED  TYPE AND SCREEN   EKG  EKG Interpretation  Date/Time:  Monday October 18 2017 10:10:47 EST Ventricular Rate:  143 PR Interval:    QRS Duration: 68 QT Interval:  356 QTC Calculation: 549 R Axis:   30 Text Interpretation:  Supraventricular tachycardia with Fusion complexes Low voltage QRS Cannot rule out Anterior infarct , age undetermined Abnormal ECG No significant change since last tracing Confirmed by Deno Etienne [phone]) on 10/18/2017 10:51:16 AM      Radiology Ct Abdomen Pelvis Wo Contrast  Result Date: 10/18/2017 CLINICAL DATA:  Abdominal pain and distention for 3 days. EXAM: CT ABDOMEN AND PELVIS WITHOUT CONTRAST TECHNIQUE: Multidetector CT imaging of the abdomen and pelvis was performed following the standard protocol without IV contrast. COMPARISON:  CT abdomen and pelvis 05/13/2017. FINDINGS: Lower chest: The distal esophagus is fluid-filled and dilated. Minimal dependent atelectasis is seen. No pleural or pericardial effusion. Heart size is normal. Hepatobiliary: No focal liver abnormality is seen. No gallstones, gallbladder wall thickening, or biliary dilatation. Pancreas: Unremarkable. No pancreatic ductal dilatation or surrounding inflammatory changes. Spleen: Normal in size without focal abnormality. Adrenals/Urinary Tract: Thickened left adrenal gland is unchanged. Right adrenal gland appears normal. Kidneys, ureters and urinary bladder are unremarkable. Stomach/Bowel: There is marked distension of the stomach. Small bowel loops are also dilated up to 4.8 cm with  air-fluid levels present. Fecalized small bowel contents are seen in the pelvis where multiple loops appear adhesed together. No pneumatosis, portal venous gas, free intraperitoneal air or fluid collection is identified. Diverticulosis of the colon without diverticulitis is noted. The appendix appears normal. Vascular/Lymphatic: No significant vascular findings are present. No enlarged abdominal or pelvic lymph nodes. Reproductive: Prostate gland is mildly prominent. Other: No ascites or hernia. Musculoskeletal: Multilevel degenerative disc disease appears worst at L3-4 and L4-5. No lytic or sclerotic lesion. IMPRESSION: Examination is positive findings consistent with a small bowel obstruction with a transition point in the pelvis. The obstruction appears to be due to adhesions. No CT evidence of small bowel ischemia. Diverticulosis without diverticulitis. Electronically Signed   By: Inge Rise M.D.   On: 10/18/2017 13:32   Procedures Procedures (including critical care time)  Medications Ordered in ED Medications  HYDROmorphone (DILAUDID) injection 1 mg (not administered)  sodium chloride 0.9 % bolus 1,000 mL (not administered)  sodium chloride 0.9 % bolus 1,000 mL (1,000 mLs Intravenous New Bag/Given 10/18/17 1238)   Initial Impression / Assessment and Plan / ED Course  I have reviewed the triage vital signs and the nursing notes.  Pertinent labs & imaging results that were available during my care of the patient were reviewed by me and considered in my medical decision making (see chart for details).    Patient presented with signs and symptoms of SBO. CT abdomen showing transition point in the pelvis and felt to be secondary to adhesions. Patient has had 3 SBOs in the past 2 years and denies abdomina surgeries. NG tube placed, patient made NPO, and surgery consulted. Patient was found to have an elevated anion gap metabolic acidosis secondary to lactic acidosis, hypercalcemia, and acute  kidney injury. He has received 2L NS bolus. FeNa <1% pointing to pre-renal etiology, will continue fluids.    Discussed placing an NG tube and admission with the patient. He agrees. Spoke with general surgery and medicine service. Patient will be admitted for further evaluation and management.   Final Clinical Impressions(s) / ED Diagnoses   Final diagnoses:  SOB (shortness of breath)   ED Discharge Orders    None  Ina Homes, MD 10/18/17 [place], [place], DO 10/18/17 1520

## 2017-10-18 NOTE — H&P (Signed)
Date: 10/18/2017               Patient Name:  Jeremiah Bowman MRN: [id number]  DOB: [birth year] Age / Sex: 61 y.o., male   PCP: Everardo Beals, NP         Medical Service: Internal Medicine Teaching Service         Attending Physician: Dr. Sid Falcon, MD    First Contact: Dr. Ronalee Red Pager: [phone]  Second Contact: Dr. Heber Sheridan Pager: [phone]       After Hours (After 5p/  First Contact Pager: [phone]  weekends / holidays): Second Contact Pager: [phone]   Chief Complaint: abdominal distention, N/V, decreased PO intake x1d  History of Present Illness:  Jeremiah Bowman is a 61yo male with PMH significant for recurrent SBO, DM, and HTN who presents with several day history of abdominal distention, N/V, and decreased PO intake.  He was in his usual state of health until 3 days ago, he noticed increased abdominal distention, decreased PO intake, and dull constant crampy upper abdominal pain. Has not had a bowel movement since 3 days ago, has not been passing gas since yesterday. Also endorses 8 episodes of coffee-ground emesis for the last day, as well as hiccups. Endorses diffuse dull anterior chest wall pain. Denies fevers, shortness of breath, dysuria, or leg swelling. Typically has a BM every 1-2 days, patient denies any blood in his stool. He does report 22lb unintentional weight loss over the last 1-2 months. Patient states he has never had a colonoscopy or endoscopy. Denies previous abdominal surgeries. He reports that his symptoms are similar to his previous SBO admissions.  Admitted in December 2016 and July 2018 with multifactorial SBO, managed medically both times. After July admission, plan was to follow up outpatient with GI for endoscopy and colonoscopy, however patient states that he did not have this done because he was not interested in getting the procedure done.  ED Course: - BP 118/79, HR 130, RR 31, temp 97.9, O2 99% on RA - WBC elevated at 15.0, glucose 315, Ca 10.4,  BUN 40, Cr 2.31, AG 20, lactic acid 4.9. FeNa 0.1% - EKG with supraventricular tachycardia. CT abdomen/pelvis with findings consistent with small bowel obstruction with transition point in the pelvis, likely due to adhesions. No evidence of small bowel ischemia. Diverticulosis. - Received 1mg  IV dilaudid and 2L IV NS bolus. NG tube placed, patient NPO - Surgery consulted in the ED. Plan for medical management, no need for surgical intervention at this time.  Meds:  Current Meds  Medication Sig  . aspirin 81 MG chewable tablet Chew 81 mg by mouth daily.  . cyclobenzaprine (FLEXERIL) 5 MG tablet Take 5 mg by mouth 3 (three) times daily as needed for muscle spasms.  . insulin glargine (LANTUS) 100 UNIT/ML injection Inject 0.1 mLs (10 Units total) into the skin daily.  . metFORMIN (GLUCOPHAGE) 1000 MG tablet Take 1,000 mg by mouth 2 (two) times daily with a meal.  . metoprolol tartrate (LOPRESSOR) 25 MG tablet Take 1 tablet (25 mg total) by mouth 2 (two) times daily.  Marland Kitchen oxybutynin (DITROPAN-XL) 5 MG 24 hr tablet Take 5 mg by mouth 2 (two) times daily.  . pravastatin (PRAVACHOL) 20 MG tablet Take 20 mg by mouth daily.  . ranitidine (ZANTAC) 75 MG tablet Take 75 mg by mouth as needed for heartburn.   Allergies: Allergies as of 10/18/2017 - Review Complete 10/18/2017  Allergen Reaction Noted  . Malarone [atovaquone-proguanil hcl]  Itching 10/18/2017   Past Medical History:  Diagnosis Date  . Acute kidney injury ([place])   . Diabetes ([place])   . DKA (diabetic ketoacidoses) ([place])   . Hyperlipidemia   . Hypertension   . Small bowel obstruction ([place]) 10/2015   Family History:  Family History  Problem Relation Age of Onset  . Hypertension Mother   . Hypertension Father    Social History:  Denies smoking or illicit drug use Drinks 1-2 glasses of brandy daily. Last drink yesterday. Lives at home with wife and daughter.  Review of Systems: A complete ROS was negative except as per  HPI.  Physical Exam: Blood pressure (!) 159/93, pulse (!) 136, temperature 97.9 F (36.6 C), temperature source Oral, resp. rate (!) 27, height 5\' 8"  (1.727 m), weight 190 lb (86.2 kg), SpO2 95 %.  GEN: Well-appearing male lying in bed in NAD. Alert and oriented. HENT: [place]/AT. Moist mucous membranes. No visible lesions. EYES: PERRL. Sclera non-icteric. Conjunctiva clear. RESP: Clear to auscultation bilaterally. No wheezes, rales, or rhonchi. No increased work of breathing. CV: Normal rate and regular rhythm. No murmurs, gallops, or rubs. No LE edema. ABD: Hypoactive bowel sounds. Firm, distended. No tenderness to palpation. No rebound or guarding. Hyperresonant to percussion. EXT: No edema. Warm. 2+ DP and radial pulses bilaterally. NEURO: Cranial nerves II-XII grossly intact. Able to lift all four extremities against gravity. No apparent audiovisual hallucinations. Speech fluent and appropriate. PSYCH: Patient is calm and pleasant. Appropriate affect. Well-groomed; speech is appropriate and on-subject.  CBC Latest Ref Rng & Units 10/18/2017 05/14/2017 05/13/2017  WBC 4.0 - 10.5 K/uL 15.0(H) 10.3 14.5(H)  Hemoglobin 13.0 - 17.0 g/dL 15.5 13.5 14.9  Hematocrit 39.0 - 52.0 % 45.0 39.7 42.9  Platelets 150 - 400 K/uL 332 212 245   CMP Latest Ref Rng & Units 10/18/2017 05/16/2017 05/15/2017  Glucose 65 - 99 mg/dL 315(H) 118(H) 98  BUN 6 - 20 mg/dL 40(H) 12 22(H)  Creatinine 0.61 - 1.24 mg/dL 2.31(H) 0.85 0.96  Sodium 135 - 145 mmol/L 139 138 142  Potassium 3.5 - 5.1 mmol/L 4.0 3.3(L) 4.1  Chloride 101 - 111 mmol/L 95(L) 107 109  CO2 22 - 32 mmol/L 24 25 26   Calcium 8.9 - 10.3 mg/dL 10.4(H) 8.5(L) 8.8(L)  Total Protein 6.5 - 8.1 g/dL 8.6(H) - -  Total Bilirubin 0.3 - 1.2 mg/dL 1.1 - -  Alkaline Phos 38 - 126 U/L 66 - -  AST 15 - 41 U/L 27 - -  ALT 17 - 63 U/L 14(L) - -   UA with small bilirubin, 5 ketones, 30 protein, small leukocytes, 6-30 squam epithelial, +mucus, +hyaline casts Lactic acid  4.9 Ethyl alcohol <71 Salicylate <0.6 FeNa 2.6%  EKG: supraventricular tachycardia, low QRS  CT Abd/Pelvis:  1. Examination is positive findings consistent with a small bowel obstruction with a transition point in the pelvis. The obstruction appears to be due to adhesions. No CT evidence of small bowel ischemia. 2.Diverticulosis without diverticulitis.  Assessment & Plan by Problem: Active Problems:   Small bowel obstruction [facility])  Jeremiah Bowman is a 61yo male with PMH significant for recurrent SBO in 2016 and July 2018 with medical management, DM, and HTN who presents with 1 day history of abdominal distention, N/V, and decreased PO intake. CT imaging shows SBO with transition point in pelvis. Labs significant for AG metabolic acidosis 2/2 lactic acidosis, hypercalcemia, and AKI. FeNa 0.1%, suggesting pre-renal etiology, s/p 2L NS bolus. Patient does technically meet SIRS  criteria, including HR > 90, RR > 20, and WBC > 12K, however I do not think this is a true infectious process at this point and likely related to SBO and dehydration.  Recurrent SBO Confirmed on CT abd/pelvis 12/3 with transition point in pelvis, likely secondary to adhesions. Patient denies history of abdominal surgeries, however he has had 2 SBOs in the past 2 years prior to this, both medically managed. Lactic acid elevated at 4.9. No evidence of mass on CT or bloody stool, however patient reports ~20lb unintentional weight loss over last 1-2 months. Has never had an endoscopy or colonoscopy, however he was encouraged to follow-up outpatient with GI after his previous admission for SBO which he did not do.  - General surgery consulted, appreciate their recommendations --- Plan for nonoperative management --- F/u abdominal xray - small bowel protocol - NPO - NG tube - Trend lactic acid - Will need outpatient follow up with GI for colonoscopy/endoscopy - CBC and BMET in AM - Senna PRN for constipation - Zofran PRN for  N/V - FOBT - Avoid narcotics - PRN tylenol for pain  AKI Cr 2.31 (baseline 0.8-1.1). FeNa <1%, suggesting pre-renal etiology, likely dehydration in setting of N/V. Patient has already received 2L NS bolus in the ED. Will continue fluids - BMET, Mg in AM - IV LR 183mL/hr - Avoid nephrotoxic agents  DM Home regimen includes metformin 1000mg  BID. Per discharge summary on 7/1, patient was discharged on 10u Lantus qday. Last A1c on 04/2017 was 10.4. BG on admission 315. - SSI-moderate - CBG monitoring  HTN Normotensive. Tachycardic. Lisinopril-HCTZ stopped at previous admission 2/2 AKI. Verapamil also discontinued. - Continue home metoprolol 25mg  BID  Alcohol use disorder Patient reports 1-2 shots of brandy daily. Last drink yesterday. - CIWA with ativan - IV thiamine  Diet: NPO VTE PPx: SCD Dispo: Admit patient to Inpatient with expected length of stay greater than 2 midnights.  Signed: Colbert Ewing, MD 10/18/2017, 6:58 PM  Pager: Mamie Nick [phone]

## 2017-10-18 NOTE — ED Notes (Signed)
[place]ber [place] [phone] Vikki Ports, RN

## 2017-10-18 NOTE — Consult Note (Signed)
[facility] Surgery Consult Note  Jeremiah Bowman December 25, 1955  [id number].    Requesting MD: Tyrone Nine, MD Chief Complaint/Reason for Consult: recurrent SBO  HPI:  Jeremiah Bowman is a 61 year old African-American male with a past medical history of diabetes, hypertension, hyperlipidemia, and recurrent small bowel obstruction who presented to [facility] emergency department with a chief complaint of abdominal pain and distention.  Patient states that 3 days ago he became distended and stopped having bowel movements. After this time he endorses upper abdominal pain that gradually moved to his entire abdomen.  Pain is described as constant, non-radiating, with intermittent cramping.  Last episode of flatus was yesterday.  Denies aggravating or alleviating factors. Associated sxs include hiccups, nausea, and with dark, coffee-ground emesis.  Patient states that up until 3 days ago he was having regular bowel movements almost every day that were soft, brown, nonbloody.  He endorses similar pain in the past with previous bowel obstructions of unknown etiology.  Both bowel obstructions (2016, 06/2017) required hospital admission and resolved with nonoperative management. He denies any history of endoscopy or colonoscopy.  He denies a personal history of cancer.  States his paternal cousins have had prostate cancer.  He denies any hernias.  Denies a history of abdominal surgeries.  He is a former smoker who quit over 30 years ago.  He drinks 2-4 shots of brandy nightly.  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  ED Workup: afebrile, HTN 123/105, tachyardic 110 bpm, WBC 15, lactic acid 4.9, Cr 2.31, BUN 40  CT Abdomen - There is marked distension of the stomach. Small bowel loops are also dilated up to 4.8 cm with air-fluid levels present. Fecalized small bowel contents are seen in the pelvis where multiple loops appear adhesed together. No pneumatosis, portal venous gas, free  intraperitoneal air or fluid collection is identified.  ROS: Review of Systems  Constitutional: Negative for chills and fever.  Respiratory: Positive for shortness of breath (x 3 days). Negative for cough, hemoptysis and sputum production.   Cardiovascular: Negative for chest pain.  Gastrointestinal: Positive for abdominal pain, nausea and vomiting. Negative for blood in stool and melena.  Genitourinary: Negative.   All other systems reviewed and are negative.   Family History  Problem Relation Age of Onset  . Hypertension Mother   . Hypertension Father     Past Medical History:  Diagnosis Date  . Acute kidney injury ([place])   . Diabetes ([place])   . DKA (diabetic ketoacidoses) ([place])   . Hyperlipidemia   . Hypertension   . Small bowel obstruction ([place]) 10/2015    Past Surgical History:  Procedure Laterality Date  . TONSILLECTOMY     patient denies  . WRIST SURGERY      Social History:  reports that he has quit smoking. he has never used smokeless tobacco. He reports that he drinks about 9.6 oz of alcohol per week. He reports that he does not use drugs.  Allergies:  Allergies  Allergen Reactions  . Malarone [Atovaquone-Proguanil Hcl] Itching     (Not in a hospital admission)  Blood pressure (!) 123/105, pulse (!) 110, temperature 97.9 F (36.6 C), temperature source Oral, resp. rate (!) 26, height 5' 8" (1.727 m), weight 86.2 kg (190 lb), SpO2 95 %. Physical Exam: Physical Exam  Constitutional: He is oriented to person, place, and time. He appears well-developed and well-nourished.  Non-toxic appearance. He does not appear ill. No distress.  HENT:  Head:  Normocephalic and atraumatic.  Mouth/Throat: No oropharyngeal exudate.  NGT in place- 400 cc dark brown output in cannister  Eyes: EOM are normal. Pupils are equal, round, and reactive to light.  Neck: Normal range of motion. Neck supple. No JVD present. No tracheal deviation present.  Cardiovascular: Regular  rhythm, normal heart sounds and intact distal pulses. Exam reveals no gallop and no friction rub.  No murmur heard. Sinus tachycardia during my exam - 108 bpm  Pulmonary/Chest: Effort normal and breath sounds normal. No accessory muscle usage. No respiratory distress. He has no wheezes. He has no rhonchi. He has no rales.  Abdominal: Soft. He exhibits distension. He exhibits no ascites and no mass. There is no tenderness. There is no rebound and no guarding.  Moderate abdominal distention, tympanic abdomen, non-tender, hypoactive BS. No hernias.   Musculoskeletal: Normal range of motion.  Neurological: He is alert and oriented to person, place, and time.  Skin: Skin is warm and dry. No rash noted. He is not diaphoretic.  Psychiatric: He has a normal mood and affect. His behavior is normal. His mood appears not anxious. He is not agitated.    Results for orders placed or performed during the hospital encounter of 10/18/17 (from the past 48 hour(s))  Comprehensive metabolic panel     Status: Abnormal   Collection Time: 10/18/17 10:01 AM  Result Value Ref Range   Sodium 139 135 - 145 mmol/L   Potassium 4.0 3.5 - 5.1 mmol/L   Chloride 95 (L) 101 - 111 mmol/L   CO2 24 22 - 32 mmol/L   Glucose, Bld 315 (H) 65 - 99 mg/dL   BUN 40 (H) 6 - 20 mg/dL   Creatinine, Ser 2.31 (H) 0.61 - 1.24 mg/dL   Calcium 10.4 (H) 8.9 - 10.3 mg/dL   Total Protein 8.6 (H) 6.5 - 8.1 g/dL   Albumin 4.5 3.5 - 5.0 g/dL   AST 27 15 - 41 U/L   ALT 14 (L) 17 - 63 U/L   Alkaline Phosphatase 66 38 - 126 U/L   Total Bilirubin 1.1 0.3 - 1.2 mg/dL   GFR calc non Af Amer 29 (L) >60 mL/min   GFR calc Af Amer 33 (L) >60 mL/min    Comment: (NOTE) The eGFR has been calculated using the CKD EPI equation. This calculation has not been validated in all clinical situations. eGFR's persistently <60 mL/min signify possible Chronic Kidney Disease.    Anion gap 20 (H) 5 - 15  CBC     Status: Abnormal   Collection Time: 10/18/17  10:01 AM  Result Value Ref Range   WBC 15.0 (H) 4.0 - 10.5 K/uL   RBC 5.25 4.22 - 5.81 MIL/uL   Hemoglobin 15.5 13.0 - 17.0 g/dL   HCT 45.0 39.0 - 52.0 %   MCV 85.7 78.0 - 100.0 fL   MCH 29.5 26.0 - 34.0 pg   MCHC 34.4 30.0 - 36.0 g/dL   RDW 13.9 11.5 - 15.5 %   Platelets 332 150 - 400 K/uL  Type and screen [place]     Status: None   Collection Time: 10/18/17 11:02 AM  Result Value Ref Range   ABO/RH(D) A POS    Antibody Screen NEG    Sample Expiration 10/21/2017   Lactic acid, plasma     Status: Abnormal   Collection Time: 10/18/17 12:18 PM  Result Value Ref Range   Lactic Acid, Venous 4.9 (HH) 0.5 - 1.9 mmol/L  Comment: CRITICAL RESULT CALLED TO, READ BACK BY AND VERIFIED WITH: CHELSEY FELTS,RN AT 1330 10/18/2017 BY ZBEECH.   Salicylate level     Status: None   Collection Time: 10/18/17 12:18 PM  Result Value Ref Range   Salicylate Lvl <1.2 2.8 - 30.0 mg/dL  Ethanol     Status: None   Collection Time: 10/18/17 12:18 PM  Result Value Ref Range   Alcohol, Ethyl (B) <10 <10 mg/dL    Comment:        LOWEST DETECTABLE LIMIT FOR SERUM ALCOHOL IS 10 mg/dL FOR MEDICAL PURPOSES ONLY   Urinalysis, Routine w reflex microscopic     Status: Abnormal   Collection Time: 10/18/17 12:48 PM  Result Value Ref Range   Color, Urine YELLOW YELLOW   APPearance HAZY (A) CLEAR   Specific Gravity, Urine 1.020 1.005 - 1.030   pH 5.0 5.0 - 8.0   Glucose, UA NEGATIVE NEGATIVE mg/dL   Hgb urine dipstick NEGATIVE NEGATIVE   Bilirubin Urine SMALL (A) NEGATIVE   Ketones, ur 5 (A) NEGATIVE mg/dL   Protein, ur 30 (A) NEGATIVE mg/dL   Nitrite NEGATIVE NEGATIVE   Leukocytes, UA SMALL (A) NEGATIVE   RBC / HPF 0-5 0 - 5 RBC/hpf   WBC, UA 0-5 0 - 5 WBC/hpf   Bacteria, UA NONE SEEN NONE SEEN   Squamous Epithelial / LPF 6-30 (A) NONE SEEN   Mucus PRESENT    Hyaline Casts, UA PRESENT   Sodium, urine, random     Status: None   Collection Time: 10/18/17 12:48 PM  Result Value  Ref Range   Sodium, Ur <10 mmol/L  Creatinine, urine, random     Status: None   Collection Time: 10/18/17 12:48 PM  Result Value Ref Range   Creatinine, Urine 223.93 mg/dL   Ct Abdomen Pelvis Wo Contrast  Result Date: 10/18/2017 CLINICAL DATA:  Abdominal pain and distention for 3 days. EXAM: CT ABDOMEN AND PELVIS WITHOUT CONTRAST TECHNIQUE: Multidetector CT imaging of the abdomen and pelvis was performed following the standard protocol without IV contrast. COMPARISON:  CT abdomen and pelvis 05/13/2017. FINDINGS: Lower chest: The distal esophagus is fluid-filled and dilated. Minimal dependent atelectasis is seen. No pleural or pericardial effusion. Heart size is normal. Hepatobiliary: No focal liver abnormality is seen. No gallstones, gallbladder wall thickening, or biliary dilatation. Pancreas: Unremarkable. No pancreatic ductal dilatation or surrounding inflammatory changes. Spleen: Normal in size without focal abnormality. Adrenals/Urinary Tract: Thickened left adrenal gland is unchanged. Right adrenal gland appears normal. Kidneys, ureters and urinary bladder are unremarkable. Stomach/Bowel: There is marked distension of the stomach. Small bowel loops are also dilated up to 4.8 cm with air-fluid levels present. Fecalized small bowel contents are seen in the pelvis where multiple loops appear adhesed together. No pneumatosis, portal venous gas, free intraperitoneal air or fluid collection is identified. Diverticulosis of the colon without diverticulitis is noted. The appendix appears normal. Vascular/Lymphatic: No significant vascular findings are present. No enlarged abdominal or pelvic lymph nodes. Reproductive: Prostate gland is mildly prominent. Other: No ascites or hernia. Musculoskeletal: Multilevel degenerative disc disease appears worst at L3-4 and L4-5. No lytic or sclerotic lesion. IMPRESSION: Examination is positive findings consistent with a small bowel obstruction with a transition point in  the pelvis. The obstruction appears to be due to adhesions. No CT evidence of small bowel ischemia. Diverticulosis without diverticulitis. Electronically Signed   By: Inge Rise M.D.   On: 10/18/2017 13:32   Assessment/Plan HTN HLD DM2 EtOH use  AKI  Recurrent SBO 2/2 unknown etiology  - No past abdominal surgeries, no known history of cancer, no hernias; has diverticulosis without active diverticulitis.  - agree with nasogastric decompression, IVF, and bowel rest. Will order small bowel protocol.  - avoid narcotic meds as able - mobilize/ IS - No acute surgical needs; if patient clinically deteriorates or fails to improve after small bowel protocol may need exploratory laparotomy.   FEN: NPO, IVF, NGT to LIWS  ID: none VTE: SCD's, ok for chemical VTE from surgical perspective    Jill Alexanders, [facility] Surgery 10/18/2017, 3:10 PM Pager: [phone] Consults: [phone] Mon-Fri 7:00 am-4:30 pm Sat-Sun 7:00 am-11:30 am

## 2017-10-19 ENCOUNTER — Inpatient Hospital Stay (HOSPITAL_COMMUNITY): Payer: Self-pay

## 2017-10-19 DIAGNOSIS — K921 Melena: Secondary | ICD-10-CM

## 2017-10-19 DIAGNOSIS — R4182 Altered mental status, unspecified: Secondary | ICD-10-CM

## 2017-10-19 DIAGNOSIS — K56609 Unspecified intestinal obstruction, unspecified as to partial versus complete obstruction: Secondary | ICD-10-CM

## 2017-10-19 DIAGNOSIS — R634 Abnormal weight loss: Secondary | ICD-10-CM

## 2017-10-19 DIAGNOSIS — K922 Gastrointestinal hemorrhage, unspecified: Secondary | ICD-10-CM

## 2017-10-19 DIAGNOSIS — R710 Precipitous drop in hematocrit: Secondary | ICD-10-CM

## 2017-10-19 LAB — GLUCOSE, CAPILLARY
GLUCOSE-CAPILLARY: 108 mg/dL — AB (ref 65–99)
Glucose-Capillary: 157 mg/dL — ABNORMAL HIGH (ref 65–99)
Glucose-Capillary: 159 mg/dL — ABNORMAL HIGH (ref 65–99)
Glucose-Capillary: 212 mg/dL — ABNORMAL HIGH (ref 65–99)
Glucose-Capillary: 244 mg/dL — ABNORMAL HIGH (ref 65–99)

## 2017-10-19 LAB — CBC
HEMATOCRIT: 39.8 % (ref 39.0–52.0)
Hemoglobin: 14 g/dL (ref 13.0–17.0)
MCH: 29.6 pg (ref 26.0–34.0)
MCHC: 35.2 g/dL (ref 30.0–36.0)
MCV: 84.1 fL (ref 78.0–100.0)
PLATELETS: 299 10*3/uL (ref 150–400)
RBC: 4.73 MIL/uL (ref 4.22–5.81)
RDW: 13.6 % (ref 11.5–15.5)
WBC: 11.1 10*3/uL — ABNORMAL HIGH (ref 4.0–10.5)

## 2017-10-19 LAB — BASIC METABOLIC PANEL
ANION GAP: 20 — AB (ref 5–15)
BUN: 65 mg/dL — AB (ref 6–20)
CALCIUM: 9 mg/dL (ref 8.9–10.3)
CHLORIDE: 94 mmol/L — AB (ref 101–111)
CO2: 26 mmol/L (ref 22–32)
CREATININE: 3.12 mg/dL — AB (ref 0.61–1.24)
GFR calc Af Amer: 23 mL/min — ABNORMAL LOW (ref >60)
GFR, EST NON AFRICAN AMERICAN: 20 mL/min — AB (ref >60)
GLUCOSE: 242 mg/dL — AB (ref 65–99)
Potassium: 3.5 mmol/L (ref 3.5–5.1)
Sodium: 140 mmol/L (ref 135–145)

## 2017-10-19 LAB — HEMOGLOBIN AND HEMATOCRIT, BLOOD
HEMATOCRIT: 34.3 % — AB (ref 39.0–52.0)
HEMOGLOBIN: 11.6 g/dL — AB (ref 13.0–17.0)

## 2017-10-19 LAB — MAGNESIUM: MAGNESIUM: 1.4 mg/dL — AB (ref 1.7–2.4)

## 2017-10-19 LAB — LACTIC ACID, PLASMA
LACTIC ACID, VENOUS: 4.4 mmol/L — AB (ref 0.5–1.9)
LACTIC ACID, VENOUS: 2.8 mmol/L — AB (ref 0.5–1.9)
Lactic Acid, Venous: 3.8 mmol/L (ref 0.5–1.9)
Lactic Acid, Venous: 4 mmol/L (ref 0.5–1.9)

## 2017-10-19 LAB — OCCULT BLOOD X 1 CARD TO LAB, STOOL: FECAL OCCULT BLD: POSITIVE — AB

## 2017-10-19 MED ORDER — SODIUM CHLORIDE 0.9 % IV BOLUS (SEPSIS)
1000.0000 mL | Freq: Once | INTRAVENOUS | Status: AC
  Administered 2017-10-19: 1000 mL via INTRAVENOUS

## 2017-10-19 MED ORDER — PANTOPRAZOLE SODIUM 40 MG IV SOLR
40.0000 mg | Freq: Two times a day (BID) | INTRAVENOUS | Status: DC
  Administered 2017-10-19 – 2017-10-20 (×3): 40 mg via INTRAVENOUS
  Filled 2017-10-19 (×3): qty 40

## 2017-10-19 MED ORDER — CHLORPROMAZINE HCL 25 MG/ML IJ SOLN
12.5000 mg | Freq: Three times a day (TID) | INTRAMUSCULAR | Status: DC | PRN
  Administered 2017-10-19 – 2017-10-20 (×2): 12.5 mg via INTRAVENOUS
  Filled 2017-10-19 (×4): qty 0.5

## 2017-10-19 MED ORDER — HEPARIN SODIUM (PORCINE) 5000 UNIT/ML IJ SOLN: 5000.0000 [IU] | Freq: Three times a day (TID) | INTRAMUSCULAR | Status: DC

## 2017-10-19 MED ORDER — PHENOL 1.4 % MT LIQD
1.0000 | OROMUCOSAL | Status: DC | PRN
  Administered 2017-10-19: 1 via OROMUCOSAL
  Filled 2017-10-19: qty 177

## 2017-10-19 NOTE — Progress Notes (Addendum)
Paged by RN at ~2pm that patient was lethargic. Went to bedside within 5 minutes to evaluate patient. Upon arrival, patient was awake and conversational. Patient denied chest pain, shortness of breath, abdominal pain, or N/V. States he is very hungry and wants something to drink. Alert and oriented x3 (person, place, and year). Patient continues to be tachycardic, thought to be secondary to dehydration. We are continuing to administer fluids and will monitor lactic acid and Cr.  Colbert Ewing, MD Internal Medicine, PGY-1

## 2017-10-19 NOTE — Progress Notes (Signed)
[facility] Progress Note     Subjective: CC:  Flatus and large, loose BM this AM. Denies abdominal pain. Still having hiccups.   Objective: Vital signs in last 24 hours: Temp:  [97.9 F (36.6 C)-98.5 F (36.9 C)] 98 F (36.7 C) (12/04 0548) Pulse Rate:  [110-143] 117 (12/04 0548) Resp:  [16-37] 20 (12/04 0548) BP: (102-159)/(60-105) 110/88 (12/04 0548) SpO2:  [90 %-99 %] 97 % (12/04 0548) Weight:  [86.2 kg (190 lb)-89.8 kg (197 lb 15.6 oz)] 89.8 kg (197 lb 15.6 oz) (12/04 0010) Last BM Date: 10/16/17  Intake/Output from previous day: 12/03 0701 - 12/04 0700 In: 3258.3 [I.V.:1258.3; IV Piggyback:2000] Out: 700 [Urine:200; Emesis/NG output:500] Intake/Output this shift: No intake/output data recorded.  PE: Gen:  Alert, NAD, pleasant HEENT: Pupils equals and round, anicteric sclerae  Card:  Regular rate and rhythm, pedal pulses 2+ BL Pulm:  Normal effort, clear to auscultation bilaterally Abd: Soft, non-tender, mild distention, bowel sounds present in all 4 quadrants  NGT- 500 cc/24h Skin: warm and dry, no rashes  Psych: A&Ox3   Lab Results:  Recent Labs    10/18/17 1001 10/19/17 0239  WBC 15.0* 11.1*  HGB 15.5 14.0  HCT 45.0 39.8  PLT 332 299   BMET Recent Labs    10/18/17 1001 10/19/17 0239  NA 139 140  K 4.0 3.5  CL 95* 94*  CO2 24 26  GLUCOSE 315* 242*  BUN 40* 65*  CREATININE 2.31* 3.12*  CALCIUM 10.4* 9.0   CMP     Component Value Date/Time   NA 140 10/19/2017 0239   K 3.5 10/19/2017 0239   CL 94 (L) 10/19/2017 0239   CO2 26 10/19/2017 0239   GLUCOSE 242 (H) 10/19/2017 0239   BUN 65 (H) 10/19/2017 0239   CREATININE 3.12 (H) 10/19/2017 0239   CALCIUM 9.0 10/19/2017 0239   PROT 8.6 (H) 10/18/2017 1001   ALBUMIN 4.5 10/18/2017 1001   AST 27 10/18/2017 1001   ALT 14 (L) 10/18/2017 1001   ALKPHOS 66 10/18/2017 1001   BILITOT 1.1 10/18/2017 1001   GFRNONAA 20 (L) 10/19/2017 0239   GFRAA 23 (L) 10/19/2017 0239   Lipase      Component Value Date/Time   LIPASE 27 05/13/2017 1007   Studies/Results: Ct Abdomen Pelvis Wo Contrast  Result Date: 10/18/2017 CLINICAL DATA:  Abdominal pain and distention for 3 days. EXAM: CT ABDOMEN AND PELVIS WITHOUT CONTRAST TECHNIQUE: Multidetector CT imaging of the abdomen and pelvis was performed following the standard protocol without IV contrast. COMPARISON:  CT abdomen and pelvis 05/13/2017. FINDINGS: Lower chest: The distal esophagus is fluid-filled and dilated. Minimal dependent atelectasis is seen. No pleural or pericardial effusion. Heart size is normal. Hepatobiliary: No focal liver abnormality is seen. No gallstones, gallbladder wall thickening, or biliary dilatation. Pancreas: Unremarkable. No pancreatic ductal dilatation or surrounding inflammatory changes. Spleen: Normal in size without focal abnormality. Adrenals/Urinary Tract: Thickened left adrenal gland is unchanged. Right adrenal gland appears normal. Kidneys, ureters and urinary bladder are unremarkable. Stomach/Bowel: There is marked distension of the stomach. Small bowel loops are also dilated up to 4.8 cm with air-fluid levels present. Fecalized small bowel contents are seen in the pelvis where multiple loops appear adhesed together. No pneumatosis, portal venous gas, free intraperitoneal air or fluid collection is identified. Diverticulosis of the colon without diverticulitis is noted. The appendix appears normal. Vascular/Lymphatic: No significant vascular findings are present. No enlarged abdominal or pelvic lymph nodes. Reproductive: Prostate gland is mildly  prominent. Other: No ascites or hernia. Musculoskeletal: Multilevel degenerative disc disease appears worst at L3-4 and L4-5. No lytic or sclerotic lesion. IMPRESSION: Examination is positive findings consistent with a small bowel obstruction with a transition point in the pelvis. The obstruction appears to be due to adhesions. No CT evidence of small bowel ischemia.  Diverticulosis without diverticulitis. Electronically Signed   By: Inge Rise M.D.   On: 10/18/2017 13:32   Dg Abd Portable 1v-small Bowel Obstruction Protocol-initial, 8 Hr Delay  Result Date: 10/19/2017 CLINICAL DATA:  Small bowel obstruction.  8 hour delayed film. EXAM: PORTABLE ABDOMEN - 1 VIEW COMPARISON:  Body CT 10/18/2017 FINDINGS: Enteric catheter with tip likely within the antrum of a dilated stomach. Minimal opacification of small bowel loops, with ongoing small bowel obstruction. The colon is decompressed. IMPRESSION: Ongoing small bowel obstruction. Electronically Signed   By: Fidela Salisbury M.D.   On: 10/19/2017 04:27    Anti-infectives: Anti-infectives (From admission, onward)   None     Assessment/Plan HTN HLD DM2 EtOH use  AKI - Creatinine worse today 3.12 from 2.31; per primary team  Recurrent SBO 2/2 unknown etiology  - No past abdominal surgeries, no known history of cancer, no hernias; has diverticulosis without active diverticulitis.  - afebrile, WBC 11 from 15 yesterday; lactic acid 3.8 from 4.4  - Small bowel protocol ordered 12/3; bowel function returning.  - start NG tube clamping trials. If pt tolerates NG tube clamped then can remove NG tube and start clears later today.   - DG Abd at 1200  - avoid narcotic meds as able - mobilize/ IS - recommend GI follow up for colonoscopy/upper endoscopy at discharge  FEN: NPO, IVF, NGT to LIWS   ID: none VTE: SCD's, ok for chemical VTE from surgical perspective    LOS: 1 day    Jill Alexanders , [facility] Surgery 10/19/2017, 7:40 AM Pager: [phone] Consults: [phone] Mon-Fri 7:00 am-4:30 pm Sat-Sun 7:00 am-11:30 am

## 2017-10-19 NOTE — Progress Notes (Signed)
Patient arrived to the unit via bed from the emergency department.  Patient is alert and oriented x 4.  Vital signs: Temp: 98. 5  BP: 135/86. Pulse 117.  Resp 19 and 96% on 2LPM of oxygen.  Skin assessment complete.  No skin issues.  IV intact left antecubital. NG tube to the left nare.  Placed the patient on telemetry and continuous pulse ox.  Educated the patient on how to reach the staff on the unit. Placed the call light within reach, lowered the bed and activate the bed alarm.  Will continue to monitor the patient.

## 2017-10-19 NOTE — Progress Notes (Signed)
AXR seen and looks improved. Contrast noted in colon. D/C NGT and start clear liquids.   Obie Dredge, PA-C [facility] Pager: [phone] Consults: [phone] Mon-Fri 7:00 am-4:30 pm Sat-Sun 7:00 am-11:30 am

## 2017-10-19 NOTE — Progress Notes (Signed)
Subjective:  Large loose BM x1 this morning. Denies abdominal pain, chest pain, or SOB. Endorses hiccups and requests medication for this. Patient states that his NG tube was coming out and nurse reports that it is now a few centimeters out.  Objective:  Vital signs in last 24 hours: Vitals:   10/18/17 2245 10/18/17 2320 10/19/17 0010 10/19/17 0548  BP:  135/86  110/88  Pulse: (!) 130 (!) 133  (!) 117  Resp: (!) 29 19  20   Temp:  98.5 F (36.9 C)  98 F (36.7 C)  TempSrc:  Oral  Oral  SpO2: 96% 96%  97%  Weight:   197 lb 15.6 oz (89.8 kg)   Height:   5\' 8"  (1.727 m)    GEN: Well-appearing male lying in bed in NAD. Alert and oriented x3 RESP: Clear to auscultation bilaterally. No wheezes, rales, or rhonchi. No increased work of breathing. CV: Tachycardic and regular rhythm. No murmurs, gallops, or rubs. No LE edema. ABD: Soft. Non-tender. Distended. Normoactive bowel sounds. EXT: No edema. Warm and well perfused. NEURO: Cranial nerves II-XII grossly intact. Able to lift all four extremities against gravity. No apparent audiovisual hallucinations. Speech fluent and appropriate. PSYCH: Patient is calm and pleasant. Appropriate affect. Well-groomed; speech is appropriate and on-subject.  Labs CBC Latest Ref Rng & Units 10/19/2017 10/18/2017 05/14/2017  WBC 4.0 - 10.5 K/uL 11.1(H) 15.0(H) 10.3  Hemoglobin 13.0 - 17.0 g/dL 14.0 15.5 13.5  Hematocrit 39.0 - 52.0 % 39.8 45.0 39.7  Platelets 150 - 400 K/uL 299 332 212   CMP Latest Ref Rng & Units 10/19/2017 10/18/2017 05/16/2017  Glucose 65 - 99 mg/dL 242(H) 315(H) 118(H)  BUN 6 - 20 mg/dL 65(H) 40(H) 12  Creatinine 0.61 - 1.24 mg/dL 3.12(H) 2.31(H) 0.85  Sodium 135 - 145 mmol/L 140 139 138  Potassium 3.5 - 5.1 mmol/L 3.5 4.0 3.3(L)  Chloride 101 - 111 mmol/L 94(L) 95(L) 107  CO2 22 - 32 mmol/L 26 24 25   Calcium 8.9 - 10.3 mg/dL 9.0 10.4(H) 8.5(L)  Total Protein 6.5 - 8.1 g/dL - 8.6(H) -  Total Bilirubin 0.3 - 1.2 mg/dL - 1.1 -    Alkaline Phos 38 - 126 U/L - 66 -  AST 15 - 41 U/L - 27 -  ALT 17 - 63 U/L - 14(L) -   Assessment/Plan:  Active Problems:   Small bowel obstruction [facility])  Mr. Tenbrink is a 61yo male with PMH significant for recurrent SBO in 2016 and July 2018 with medical management, DM, and HTN who presents with 1 day history of abdominal distention, N/V, and decreased PO intake, with imaging consistent with SBO with transition point in pelvis. Labs significant for AG metabolic acidosis 2/2 lactic acidosis, hypercalcemia, and AKI. FeNa 0.1%, suggesting pre-renal etiology, s/p 4L NS bolus + maintenance fluids. Had 1 large loose BM this morning.  Recurrent SBO Confirmed on CT abd/pelvis 12/3 with transition point in pelvis, likely secondary to adhesions. No history of abdominal surgeries. Has had 2 SBOs in the past 2 years prior to this, both medically managed. Lactic acid trending 4.9 -> 3.8 -> 4.1 (+1L NS bolus) -> 4.4 (+1L NS bolus) -> 3.8. AG remains elevated at 20 this morning. Large loose BM x1 this morning. NG tube now ~2cm out. FOBT positive. - General surgery consulted, appreciate their recommendations --- Plan for nonoperative management - NPO - F/u abdominal xray at 12pm - Trials of clamping NG tube --- If patient tolerating NG tube clamp,  can remove NG tube and start CLD later - Trending lactic acid - maintenance IV LR 170mL/hr + bolus PRN - Will need outpatient follow up with GI for colonoscopy/endoscopy - Senna PRN for constipation - Zofran and phenergan PRN for N/V - Thorazine PRN for hiccups - Avoid narcotics if possible - Ambulate as able - PRN tylenol for pain - BMET in AM  AKI Cr 2.31 -> 3.12 (baseline 0.8-1.1). FeNa <1%, suggesting pre-renal etiology, likely dehydration in setting of N/V. Patient now s/p 4L NS bolus. Will continue maintenance fluids as well as boluses as needed. - BMET in AM - IV LR 116mL/hr - Avoid nephrotoxic agents  DM Home regimen includes metformin  1000mg  BID. Per discharge summary on 7/1, patient was discharged on 10u Lantus qday. Last A1c on 04/2017 was 10.4. BG in 200s-300s. - SSI-moderate - CBG monitoring  HTN Normotensive. Tachycardic to 110-120s. Lisinopril-HCTZ stopped at previous admission 2/2 AKI. Verapamil also discontinued. - Continue home metoprolol 25mg  BID  Alcohol use disorder Patient reports 1-2 shots of brandy daily. Last drink one day prior to admission. - CIWA with ativan - IV thiamine  Dispo: Anticipated discharge in approximately 2-3 day(s).   Colbert Ewing, MD 10/19/2017, 7:50 AM Pager: Mamie Nick [phone]

## 2017-10-19 NOTE — H&P (View-Only) (Signed)
[facility] Consult: 1:45 PM 10/19/2017  LOS: 1 day    Referring Provider:  Dr Daryll Drown  Primary Care Physician:  Everardo Beals, NP Primary Gastroenterologist:  Althia Forts.       Reason for Consultation:  Dark NGT output and FOBT + stool.     HPI: Jeremiah Bowman is a 61 y.o. male.  Hx poorly controlled DM2, requires insulin  Htn.   Recurrent SBOs in 10/2015, 05/2017    Admitted in 05/2017 with SBO, SIRS, DKA, AKI.  Dr Donne Hazel stated obstruction "resolved this quickly so I am assuming this is ileus likely from metabolic derangements", he recommended outpt colonoscopy but pt failed to arrange outpt follow up.  Situation resolved with medical mgt.   Now returns with 3 days N/V, abdominal pain and distention.  Last BM 3 days PTA.  22# wt loss in 6 to 8 weeks.   + AKI.  Glucose in 200s - 300s.  LFTs not deranged.  Hgb 15.5 >> 14 after IVF.  WBCs 15 >> 11.   FOBT +. CT ab pelvis without contrast: SBO appears secondary to adhesions, transition point in pelvis.  Diverticulosis KUB with ongoing likely SBO  This AM had a dark, loose stool, it tests FOBT +.  NGT ouput is watery, dark brown.  Has not passed any grossly bloody fluids.  No abd pain or nausea since NGT placed.    Home med list includes as needed Zantac 75 to 150 mg prn, Naprosyn twice daily, metoclopramide with meals.  Notes state he drinks brandy, or beer 2 drinks per day    Past Medical History:  Diagnosis Date  . Acute kidney injury ([place])   . Diabetes ([place])   . DKA (diabetic ketoacidoses) ([place])   . Hyperlipidemia   . Hypertension   . Small bowel obstruction ([place]) 10/2015    Past Surgical History:  Procedure Laterality Date  . TONSILLECTOMY     patient denies  . WRIST SURGERY      Prior to Admission medications   Medication Sig Start  Date End Date Taking? Authorizing Provider  aspirin 81 MG chewable tablet Chew 81 mg by mouth daily.   Yes [provider]  cyclobenzaprine (FLEXERIL) 5 MG tablet Take 5 mg by mouth 3 (three) times daily as needed for muscle spasms.   Yes [provider]  insulin glargine (LANTUS) 100 UNIT/ML injection Inject 0.1 mLs (10 Units total) into the skin daily. 05/16/17 05/16/18 Yes Allie Bossier, MD  metFORMIN (GLUCOPHAGE) 1000 MG tablet Take 1,000 mg by mouth 2 (two) times daily with a meal.   Yes [provider]  metoprolol tartrate (LOPRESSOR) 25 MG tablet Take 1 tablet (25 mg total) by mouth 2 (two) times daily. 05/16/17  Yes Allie Bossier, MD  oxybutynin (DITROPAN-XL) 5 MG 24 hr tablet Take 5 mg by mouth 2 (two) times daily.   Yes [provider]  pravastatin (PRAVACHOL) 20 MG tablet Take 20 mg by mouth daily.   Yes [provider]  ranitidine (ZANTAC) 75 MG tablet Take 75 mg  by mouth as needed for heartburn.   Yes [provider]  metoCLOPramide (REGLAN) 5 MG tablet Take 1 tablet (5 mg total) by mouth 3 (three) times daily before meals. Patient not taking: Reported on 10/18/2017 05/16/17   Allie Bossier, MD  naproxen (NAPROSYN) 500 MG tablet Take 500 mg by mouth 2 (two) times daily with a meal.    [provider]  NEEDLE, DISP, 30 G (BD DISP NEEDLES) 30G X 1/2" MISC 10 Units by Does not apply route daily. 05/16/17   Allie Bossier, MD  ranitidine (ZANTAC) 150 MG tablet Take 150 mg by mouth daily as needed for heartburn.    [provider]    Scheduled Meds: . insulin aspart  0-15 Units Subcutaneous TID WC  . LORazepam  0-4 mg Intravenous Q6H   Followed by  . [START ON 10/20/2017] LORazepam  0-4 mg Intravenous Q12H  . thiamine  100 mg Oral Daily   Or  . thiamine  100 mg Intravenous Daily   Infusions: . chlorproMAZINE (THORAZINE) IV Stopped (10/19/17 1340)  . lactated ringers 150 mL/hr at 10/19/17 0838   PRN  Meds: acetaminophen **OR** acetaminophen, chlorproMAZINE (THORAZINE) IV, phenol, promethazine, senna-docusate   Allergies as of 10/18/2017 - Review Complete 10/18/2017  Allergen Reaction Noted  . Malarone [atovaquone-proguanil hcl] Itching 10/18/2017    Family History  Problem Relation Age of Onset  . Hypertension Mother   . Hypertension Father     Social History   Socioeconomic History  . Marital status: Married    Spouse name: Not on file  . Number of children: Not on file  . Years of education: Not on file  . Highest education level: Not on file  Social Needs  . Financial resource strain: Not on file  . Food insecurity - worry: Not on file  . Food insecurity - inability: Not on file  . Transportation needs - medical: Not on file  . Transportation needs - non-medical: Not on file  Occupational History  . Not on file  Tobacco Use  . Smoking status: Former Research scientist (life sciences)  . Smokeless tobacco: Never Used  Substance and Sexual Activity  . Alcohol use: Yes    Alcohol/week: 9.6 oz    Types: 2 Cans of beer, 14 Shots of liquor per week  . Drug use: No  . Sexual activity: Not on file  Other Topics Concern  . Not on file  Social History Narrative  . Not on file    REVIEW OF SYSTEMS: Pt obtunded at exam and interview, unable to obtain a ROS   PHYSICAL EXAM: Vital signs in last 24 hours: Vitals:   10/19/17 0548 10/19/17 1155  BP: 110/88 107/74  Pulse: (!) 117 (!) 115  Resp: 20 17  Temp: 98 F (36.7 C) 98 F (36.7 C)  SpO2: 97% 96%   Wt Readings from Last 3 Encounters:  10/19/17 89.8 kg (197 lb 15.6 oz)  05/13/17 97.5 kg (215 lb)  10/27/15 98.6 kg (217 lb 6 oz)    General: Obese, cushingoid appearing BM.  Very difficult to arouse and does not maintain arousal. Head: No signs of head trauma.  No asymmetry. Eyes: No scleral icterus.  No conjunctival pallor. Ears: Unable to assess hearing Nose: No discharge.  NG tube in place and there is watery, dark brown fluid  approximately 50 cc in the NG tube canister Mouth: No gross blood or sores. Neck: No JVD, no masses, no thyromegaly. Lungs: Nonlabored breathing.  Snoring Heart:  tachy in 120s. Regular.   No MRG.  S1, S2 present Abdomen: Distended but soft.  Not tender.  Bowel sounds hypoactive.  No tingling or tympanitic bowel sounds.   Rectal: DRE not performed.  Patient incontinent of liquid, black, 3+ FOBT + stool Musc/Skeltl: no obvious joint deformities or swelling. Extremities: No CCE. Neurologic: Obtunded.  Difficult to arouse.  When he does arouse he startles awake but falls back to "sleep' within seconds.  Minimally following commands.  At one point did not arouse with sternal rub. Skin: No rashes or sores. Tattoos: None observed Nodes: No cervical adenopathy. Psych: Not agitated.  As the patient is obtunded, unable to effectively assess mood.     LAB RESULTS: Recent Labs    10/18/17 1001 10/19/17 0239  WBC 15.0* 11.1*  HGB 15.5 14.0  HCT 45.0 39.8  PLT 332 299   BMET Lab Results  Component Value Date   NA 140 10/19/2017   NA 139 10/18/2017   NA 138 05/16/2017   K 3.5 10/19/2017   K 4.0 10/18/2017   K 3.3 (L) 05/16/2017   CL 94 (L) 10/19/2017   CL 95 (L) 10/18/2017   CL 107 05/16/2017   CO2 26 10/19/2017   CO2 24 10/18/2017   CO2 25 05/16/2017   GLUCOSE 242 (H) 10/19/2017   GLUCOSE 315 (H) 10/18/2017   GLUCOSE 118 (H) 05/16/2017   BUN 65 (H) 10/19/2017   BUN 40 (H) 10/18/2017   BUN 12 05/16/2017   CREATININE 3.12 (H) 10/19/2017   CREATININE 2.31 (H) 10/18/2017   CREATININE 0.85 05/16/2017   CALCIUM 9.0 10/19/2017   CALCIUM 10.4 (H) 10/18/2017   CALCIUM 8.5 (L) 05/16/2017   LFT Recent Labs    10/18/17 1001  PROT 8.6*  ALBUMIN 4.5  AST 27  ALT 14*  ALKPHOS 66  BILITOT 1.1   PT/INR Lab Results  Component Value Date   INR 1.08 05/13/2017   INR 1.16 10/24/2015   INR 1.19 10/24/2015   Hepatitis Panel No results for input(s): HEPBSAG, HCVAB, HEPAIGM, HEPBIGM  in the last 72 hours. C-Diff No components found for: CDIFF Lipase     Component Value Date/Time   LIPASE 27 05/13/2017 1007    Drugs of Abuse  No results found for: LABOPIA, COCAINSCRNUR, LABBENZ, AMPHETMU, THCU, LABBARB   RADIOLOGY STUDIES: Ct Abdomen Pelvis Wo Contrast  Result Date: 10/18/2017 CLINICAL DATA:  Abdominal pain and distention for 3 days. EXAM: CT ABDOMEN AND PELVIS WITHOUT CONTRAST TECHNIQUE: Multidetector CT imaging of the abdomen and pelvis was performed following the standard protocol without IV contrast. COMPARISON:  CT abdomen and pelvis 05/13/2017. FINDINGS: Lower chest: The distal esophagus is fluid-filled and dilated. Minimal dependent atelectasis is seen. No pleural or pericardial effusion. Heart size is normal. Hepatobiliary: No focal liver abnormality is seen. No gallstones, gallbladder wall thickening, or biliary dilatation. Pancreas: Unremarkable. No pancreatic ductal dilatation or surrounding inflammatory changes. Spleen: Normal in size without focal abnormality. Adrenals/Urinary Tract: Thickened left adrenal gland is unchanged. Right adrenal gland appears normal. Kidneys, ureters and urinary bladder are unremarkable. Stomach/Bowel: There is marked distension of the stomach. Small bowel loops are also dilated up to 4.8 cm with air-fluid levels present. Fecalized small bowel contents are seen in the pelvis where multiple loops appear adhesed together. No pneumatosis, portal venous gas, free intraperitoneal air or fluid collection is identified. Diverticulosis of the colon without diverticulitis is noted. The appendix appears normal. Vascular/Lymphatic: No significant vascular findings are present. No enlarged abdominal or  pelvic lymph nodes. Reproductive: Prostate gland is mildly prominent. Other: No ascites or hernia. Musculoskeletal: Multilevel degenerative disc disease appears worst at L3-4 and L4-5. No lytic or sclerotic lesion. IMPRESSION: Examination is positive  findings consistent with a small bowel obstruction with a transition point in the pelvis. The obstruction appears to be due to adhesions. No CT evidence of small bowel ischemia. Diverticulosis without diverticulitis. Electronically Signed   By: Inge Rise M.D.   On: 10/18/2017 13:32   Dg Abd Portable 1v  Result Date: 10/19/2017 CLINICAL DATA:  Small-bowel obstruction. History of hypertension, acute renal injury, diabetes. EXAM: PORTABLE ABDOMEN - 1 VIEW COMPARISON:  10/19/2017.  CT 10/18/2017. FINDINGS: NG tube noted in the stomach. Persistent mild gastric distention. Persistent small bowel distention cannot be excluded. Bowel distention has improved from prior CT of 10/18/2017. Oral contrast is however noted the colon. Findings suggest partial small bowel obstruction with interim improvement. No free air identified . Degenerative change thoracic spine scratched it degenerative changes lumbar spine and both hips. IMPRESSION: NG tube in the stomach. Persistent mild gastric distention. Persistent small bowel distention cannot be excluded. Oral contrast is however noted in the colon. Findings suggest partial small bowel obstruction with interim improvement . Electronically Signed   By: Marcello Moores  Register   On: 10/19/2017 12:17   Dg Abd Portable 1v-small Bowel Obstruction Protocol-initial, 8 Hr Delay  Result Date: 10/19/2017 CLINICAL DATA:  Small bowel obstruction.  8 hour delayed film. EXAM: PORTABLE ABDOMEN - 1 VIEW COMPARISON:  Body CT 10/18/2017 FINDINGS: Enteric catheter with tip likely within the antrum of a dilated stomach. Minimal opacification of small bowel loops, with ongoing small bowel obstruction. The colon is decompressed. IMPRESSION: Ongoing small bowel obstruction. Electronically Signed   By: Fidela Salisbury M.D.   On: 10/19/2017 04:27     IMPRESSION:   *  SBO vs ileus.  Per review of surgical and other notes in the chart, patient has not undergone previous abdominal or pelvic  surgery.  No evidence of mass on CT scan Wt loss.  R/o occult GI malignancy.    *  GI bleed with dark NGT output and melenic stool starting today.   Latest Hgb was this morning and reflected a 1.5 g drop to 14.  Suspect some of this from IVF, dilution Question NSAID induced ulcer?  *  AMS.   Wonder if the patient has obstructive sleep apnea?  *  AKI  *  Poorly controlled DM2   PLAN:     *  Will need EGD, arrange for tomorrow.  Starting BID IV Protonix.  Hgb hct this afternoon.  CBC in AM.    Addendum.  About 15 minutes following my initial examination I walked back in the room to assess the patient.  He was alert.  He was oriented and entirely appropriate.  He tells me that other than this recent onset of nausea and vomiting that he has had no trouble eating.  He is never had dark stools before.  He does take the Naprosyn and occasionally uses Zantac.  I discussed the plans for endoscopy to evaluate for the source of bleeding and possible need for intervention to control bleeding.  Patient was agreeable to this.  He is capable of signing his own consent.  Vision also denied previous sleep apnea studies or issues with falling asleep during the day and being difficult to arouse.  He says he just feels really tired.  He does say that his wife tells him  that he snores.   Azucena Freed  10/19/2017, 1:45 PM Pager: [phone]    ________________________________________________________________________  Velora Heckler GI MD note:  I personally examined the patient, reviewed the data and agree with the assessment and plan described above.  Recurrent, apparently mechanical SBO of unclear etiology; he has never had abdominal surgery.  Seems to be resolving this episode again and I agree it is OK to remove the NG tube.  He started having melenic stools today.  He is on IV PPI twice daily and I am planning EGD tomorrow for him.  Owens Loffler, MD [facility] Pager [phone]

## 2017-10-19 NOTE — Progress Notes (Signed)
CRITICAL VALUE ALERT  Critical Value: Lactic Acid 4.4  Date & Time Notied:  10/19/17 0416  Provider Notified: Johny Chess   Orders Received/Actions taken: 1 Liter bolus of NS

## 2017-10-19 NOTE — Consult Note (Signed)
[facility] Consult: 1:45 PM 10/19/2017  LOS: 1 day    Referring Provider:  Dr Daryll Drown  Primary Care Physician:  Everardo Beals, NP Primary Gastroenterologist:  Althia Forts.       Reason for Consultation:  Dark NGT output and FOBT + stool.     HPI: Jeremiah Bowman is a 61 y.o. male.  Hx poorly controlled DM2, requires insulin  Htn.   Recurrent SBOs in 10/2015, 05/2017    Admitted in 05/2017 with SBO, SIRS, DKA, AKI.  Dr Donne Hazel stated obstruction "resolved this quickly so I am assuming this is ileus likely from metabolic derangements", he recommended outpt colonoscopy but pt failed to arrange outpt follow up.  Situation resolved with medical mgt.   Now returns with 3 days N/V, abdominal pain and distention.  Last BM 3 days PTA.  22# wt loss in 6 to 8 weeks.   + AKI.  Glucose in 200s - 300s.  LFTs not deranged.  Hgb 15.5 >> 14 after IVF.  WBCs 15 >> 11.   FOBT +. CT ab pelvis without contrast: SBO appears secondary to adhesions, transition point in pelvis.  Diverticulosis KUB with ongoing likely SBO  This AM had a dark, loose stool, it tests FOBT +.  NGT ouput is watery, dark brown.  Has not passed any grossly bloody fluids.  No abd pain or nausea since NGT placed.    Home med list includes as needed Zantac 75 to 150 mg prn, Naprosyn twice daily, metoclopramide with meals.  Notes state he drinks brandy, or beer 2 drinks per day    Past Medical History:  Diagnosis Date  . Acute kidney injury ([place])   . Diabetes ([place])   . DKA (diabetic ketoacidoses) ([place])   . Hyperlipidemia   . Hypertension   . Small bowel obstruction ([place]) 10/2015    Past Surgical History:  Procedure Laterality Date  . TONSILLECTOMY     patient denies  . WRIST SURGERY      Prior to Admission medications   Medication Sig Start  Date End Date Taking? Authorizing Provider  aspirin 81 MG chewable tablet Chew 81 mg by mouth daily.   Yes [provider]  cyclobenzaprine (FLEXERIL) 5 MG tablet Take 5 mg by mouth 3 (three) times daily as needed for muscle spasms.   Yes [provider]  insulin glargine (LANTUS) 100 UNIT/ML injection Inject 0.1 mLs (10 Units total) into the skin daily. 05/16/17 05/16/18 Yes Allie Bossier, MD  metFORMIN (GLUCOPHAGE) 1000 MG tablet Take 1,000 mg by mouth 2 (two) times daily with a meal.   Yes [provider]  metoprolol tartrate (LOPRESSOR) 25 MG tablet Take 1 tablet (25 mg total) by mouth 2 (two) times daily. 05/16/17  Yes Allie Bossier, MD  oxybutynin (DITROPAN-XL) 5 MG 24 hr tablet Take 5 mg by mouth 2 (two) times daily.   Yes [provider]  pravastatin (PRAVACHOL) 20 MG tablet Take 20 mg by mouth daily.   Yes [provider]  ranitidine (ZANTAC) 75 MG tablet Take 75 mg  by mouth as needed for heartburn.   Yes [provider]  metoCLOPramide (REGLAN) 5 MG tablet Take 1 tablet (5 mg total) by mouth 3 (three) times daily before meals. Patient not taking: Reported on 10/18/2017 05/16/17   Allie Bossier, MD  naproxen (NAPROSYN) 500 MG tablet Take 500 mg by mouth 2 (two) times daily with a meal.    [provider]  NEEDLE, DISP, 30 G (BD DISP NEEDLES) 30G X 1/2" MISC 10 Units by Does not apply route daily. 05/16/17   Allie Bossier, MD  ranitidine (ZANTAC) 150 MG tablet Take 150 mg by mouth daily as needed for heartburn.    [provider]    Scheduled Meds: . insulin aspart  0-15 Units Subcutaneous TID WC  . LORazepam  0-4 mg Intravenous Q6H   Followed by  . [START ON 10/20/2017] LORazepam  0-4 mg Intravenous Q12H  . thiamine  100 mg Oral Daily   Or  . thiamine  100 mg Intravenous Daily   Infusions: . chlorproMAZINE (THORAZINE) IV Stopped (10/19/17 1340)  . lactated ringers 150 mL/hr at 10/19/17 0838   PRN  Meds: acetaminophen **OR** acetaminophen, chlorproMAZINE (THORAZINE) IV, phenol, promethazine, senna-docusate   Allergies as of 10/18/2017 - Review Complete 10/18/2017  Allergen Reaction Noted  . Malarone [atovaquone-proguanil hcl] Itching 10/18/2017    Family History  Problem Relation Age of Onset  . Hypertension Mother   . Hypertension Father     Social History   Socioeconomic History  . Marital status: Married    Spouse name: Not on file  . Number of children: Not on file  . Years of education: Not on file  . Highest education level: Not on file  Social Needs  . Financial resource strain: Not on file  . Food insecurity - worry: Not on file  . Food insecurity - inability: Not on file  . Transportation needs - medical: Not on file  . Transportation needs - non-medical: Not on file  Occupational History  . Not on file  Tobacco Use  . Smoking status: Former Research scientist (life sciences)  . Smokeless tobacco: Never Used  Substance and Sexual Activity  . Alcohol use: Yes    Alcohol/week: 9.6 oz    Types: 2 Cans of beer, 14 Shots of liquor per week  . Drug use: No  . Sexual activity: Not on file  Other Topics Concern  . Not on file  Social History Narrative  . Not on file    REVIEW OF SYSTEMS: Pt obtunded at exam and interview, unable to obtain a ROS   PHYSICAL EXAM: Vital signs in last 24 hours: Vitals:   10/19/17 0548 10/19/17 1155  BP: 110/88 107/74  Pulse: (!) 117 (!) 115  Resp: 20 17  Temp: 98 F (36.7 C) 98 F (36.7 C)  SpO2: 97% 96%   Wt Readings from Last 3 Encounters:  10/19/17 89.8 kg (197 lb 15.6 oz)  05/13/17 97.5 kg (215 lb)  10/27/15 98.6 kg (217 lb 6 oz)    General: Obese, cushingoid appearing BM.  Very difficult to arouse and does not maintain arousal. Head: No signs of head trauma.  No asymmetry. Eyes: No scleral icterus.  No conjunctival pallor. Ears: Unable to assess hearing Nose: No discharge.  NG tube in place and there is watery, dark brown fluid  approximately 50 cc in the NG tube canister Mouth: No gross blood or sores. Neck: No JVD, no masses, no thyromegaly. Lungs: Nonlabored breathing.  Snoring Heart:  tachy in 120s. Regular.   No MRG.  S1, S2 present Abdomen: Distended but soft.  Not tender.  Bowel sounds hypoactive.  No tingling or tympanitic bowel sounds.   Rectal: DRE not performed.  Patient incontinent of liquid, black, 3+ FOBT + stool Musc/Skeltl: no obvious joint deformities or swelling. Extremities: No CCE. Neurologic: Obtunded.  Difficult to arouse.  When he does arouse he startles awake but falls back to "sleep' within seconds.  Minimally following commands.  At one point did not arouse with sternal rub. Skin: No rashes or sores. Tattoos: None observed Nodes: No cervical adenopathy. Psych: Not agitated.  As the patient is obtunded, unable to effectively assess mood.     LAB RESULTS: Recent Labs    10/18/17 1001 10/19/17 0239  WBC 15.0* 11.1*  HGB 15.5 14.0  HCT 45.0 39.8  PLT 332 299   BMET Lab Results  Component Value Date   NA 140 10/19/2017   NA 139 10/18/2017   NA 138 05/16/2017   K 3.5 10/19/2017   K 4.0 10/18/2017   K 3.3 (L) 05/16/2017   CL 94 (L) 10/19/2017   CL 95 (L) 10/18/2017   CL 107 05/16/2017   CO2 26 10/19/2017   CO2 24 10/18/2017   CO2 25 05/16/2017   GLUCOSE 242 (H) 10/19/2017   GLUCOSE 315 (H) 10/18/2017   GLUCOSE 118 (H) 05/16/2017   BUN 65 (H) 10/19/2017   BUN 40 (H) 10/18/2017   BUN 12 05/16/2017   CREATININE 3.12 (H) 10/19/2017   CREATININE 2.31 (H) 10/18/2017   CREATININE 0.85 05/16/2017   CALCIUM 9.0 10/19/2017   CALCIUM 10.4 (H) 10/18/2017   CALCIUM 8.5 (L) 05/16/2017   LFT Recent Labs    10/18/17 1001  PROT 8.6*  ALBUMIN 4.5  AST 27  ALT 14*  ALKPHOS 66  BILITOT 1.1   PT/INR Lab Results  Component Value Date   INR 1.08 05/13/2017   INR 1.16 10/24/2015   INR 1.19 10/24/2015   Hepatitis Panel No results for input(s): HEPBSAG, HCVAB, HEPAIGM, HEPBIGM  in the last 72 hours. C-Diff No components found for: CDIFF Lipase     Component Value Date/Time   LIPASE 27 05/13/2017 1007    Drugs of Abuse  No results found for: LABOPIA, COCAINSCRNUR, LABBENZ, AMPHETMU, THCU, LABBARB   RADIOLOGY STUDIES: Ct Abdomen Pelvis Wo Contrast  Result Date: 10/18/2017 CLINICAL DATA:  Abdominal pain and distention for 3 days. EXAM: CT ABDOMEN AND PELVIS WITHOUT CONTRAST TECHNIQUE: Multidetector CT imaging of the abdomen and pelvis was performed following the standard protocol without IV contrast. COMPARISON:  CT abdomen and pelvis 05/13/2017. FINDINGS: Lower chest: The distal esophagus is fluid-filled and dilated. Minimal dependent atelectasis is seen. No pleural or pericardial effusion. Heart size is normal. Hepatobiliary: No focal liver abnormality is seen. No gallstones, gallbladder wall thickening, or biliary dilatation. Pancreas: Unremarkable. No pancreatic ductal dilatation or surrounding inflammatory changes. Spleen: Normal in size without focal abnormality. Adrenals/Urinary Tract: Thickened left adrenal gland is unchanged. Right adrenal gland appears normal. Kidneys, ureters and urinary bladder are unremarkable. Stomach/Bowel: There is marked distension of the stomach. Small bowel loops are also dilated up to 4.8 cm with air-fluid levels present. Fecalized small bowel contents are seen in the pelvis where multiple loops appear adhesed together. No pneumatosis, portal venous gas, free intraperitoneal air or fluid collection is identified. Diverticulosis of the colon without diverticulitis is noted. The appendix appears normal. Vascular/Lymphatic: No significant vascular findings are present. No enlarged abdominal or  pelvic lymph nodes. Reproductive: Prostate gland is mildly prominent. Other: No ascites or hernia. Musculoskeletal: Multilevel degenerative disc disease appears worst at L3-4 and L4-5. No lytic or sclerotic lesion. IMPRESSION: Examination is positive  findings consistent with a small bowel obstruction with a transition point in the pelvis. The obstruction appears to be due to adhesions. No CT evidence of small bowel ischemia. Diverticulosis without diverticulitis. Electronically Signed   By: Inge Rise M.D.   On: 10/18/2017 13:32   Dg Abd Portable 1v  Result Date: 10/19/2017 CLINICAL DATA:  Small-bowel obstruction. History of hypertension, acute renal injury, diabetes. EXAM: PORTABLE ABDOMEN - 1 VIEW COMPARISON:  10/19/2017.  CT 10/18/2017. FINDINGS: NG tube noted in the stomach. Persistent mild gastric distention. Persistent small bowel distention cannot be excluded. Bowel distention has improved from prior CT of 10/18/2017. Oral contrast is however noted the colon. Findings suggest partial small bowel obstruction with interim improvement. No free air identified . Degenerative change thoracic spine scratched it degenerative changes lumbar spine and both hips. IMPRESSION: NG tube in the stomach. Persistent mild gastric distention. Persistent small bowel distention cannot be excluded. Oral contrast is however noted in the colon. Findings suggest partial small bowel obstruction with interim improvement . Electronically Signed   By: Marcello Moores  Register   On: 10/19/2017 12:17   Dg Abd Portable 1v-small Bowel Obstruction Protocol-initial, 8 Hr Delay  Result Date: 10/19/2017 CLINICAL DATA:  Small bowel obstruction.  8 hour delayed film. EXAM: PORTABLE ABDOMEN - 1 VIEW COMPARISON:  Body CT 10/18/2017 FINDINGS: Enteric catheter with tip likely within the antrum of a dilated stomach. Minimal opacification of small bowel loops, with ongoing small bowel obstruction. The colon is decompressed. IMPRESSION: Ongoing small bowel obstruction. Electronically Signed   By: Fidela Salisbury M.D.   On: 10/19/2017 04:27     IMPRESSION:   *  SBO vs ileus.  Per review of surgical and other notes in the chart, patient has not undergone previous abdominal or pelvic  surgery.  No evidence of mass on CT scan Wt loss.  R/o occult GI malignancy.    *  GI bleed with dark NGT output and melenic stool starting today.   Latest Hgb was this morning and reflected a 1.5 g drop to 14.  Suspect some of this from IVF, dilution Question NSAID induced ulcer?  *  AMS.   Wonder if the patient has obstructive sleep apnea?  *  AKI  *  Poorly controlled DM2   PLAN:     *  Will need EGD, arrange for tomorrow.  Starting BID IV Protonix.  Hgb hct this afternoon.  CBC in AM.    Addendum.  About 15 minutes following my initial examination I walked back in the room to assess the patient.  He was alert.  He was oriented and entirely appropriate.  He tells me that other than this recent onset of nausea and vomiting that he has had no trouble eating.  He is never had dark stools before.  He does take the Naprosyn and occasionally uses Zantac.  I discussed the plans for endoscopy to evaluate for the source of bleeding and possible need for intervention to control bleeding.  Patient was agreeable to this.  He is capable of signing his own consent.  Vision also denied previous sleep apnea studies or issues with falling asleep during the day and being difficult to arouse.  He says he just feels really tired.  He does say that his wife tells him  that he snores.   Azucena Freed  10/19/2017, 1:45 PM Pager: [phone]    ________________________________________________________________________  Velora Heckler GI MD note:  I personally examined the patient, reviewed the data and agree with the assessment and plan described above.  Recurrent, apparently mechanical SBO of unclear etiology; he has never had abdominal surgery.  Seems to be resolving this episode again and I agree it is OK to remove the NG tube.  He started having melenic stools today.  He is on IV PPI twice daily and I am planning EGD tomorrow for him.  Owens Loffler, MD [facility] Pager [phone]

## 2017-10-19 NOTE — Progress Notes (Signed)
Initial Nutrition Assessment  DOCUMENTATION CODES:   Obesity unspecified  INTERVENTION:  1. Boost Breeze po TID, each supplement provides 250 kcal and 9 grams of protein  NUTRITION DIAGNOSIS:   Unintentional weight loss related to acute illness, poor appetite as evidenced by percent weight loss.  GOAL:   Patient will meet greater than or equal to 90% of their needs  MONITOR:   PO intake, I & O's, Labs, Supplement acceptance, Weight trends, Diet advancement  REASON FOR ASSESSMENT:   Malnutrition Screening Tool    ASSESSMENT:   Mr. Helvey is a 61yo male with PMH significant for recurrent SBO, DM, and HTN who presents with several day history of abdominal distention, N/V, and decreased PO intake.   Spoke with Mr. Morad at bedside. He reports no PO intake x3 days but also reports decreased PO intake for the past 2 months leading to a 22 pound weight loss, UBW of 218 pounds indicating a 10% severe weight loss. He states he has still been eating 2 medium sized meals per day, was not very specific about them. Denies nausea/vomiting today, also had a large BM and flatus but still reports some abdominal pain, appears distended and taut. NGT with very little output. On Clears now. Wants food and drink. Monitor PO intake, will provide recommendations as necessary.  Labs reviewed:  CBGs 212, 244, 200  Medications reviewed and include:  Insulin, Thiamine, Ativan LR at 138mL/hr    NUTRITION - FOCUSED PHYSICAL EXAM:    Most Recent Value  Orbital Region  No depletion  Upper Arm Region  No depletion  Thoracic and Lumbar Region  No depletion  Buccal Region  No depletion  Temple Region  No depletion  Clavicle Bone Region  No depletion  Clavicle and Acromion Bone Region  No depletion  Scapular Bone Region  No depletion  Dorsal Hand  No depletion  Patellar Region  No depletion  Anterior Thigh Region  No depletion  Posterior Calf Region  No depletion  Edema (RD Assessment)  None   Hair  Reviewed  Eyes  Reviewed  Mouth  Reviewed  Skin  Reviewed  Nails  Reviewed       Diet Order:  Diet clear liquid Room service appropriate? Yes; Fluid consistency: Thin  EDUCATION NEEDS:   Education needs have been addressed  Skin:  Skin Assessment: Reviewed RN Assessment  Last BM:  10/19/2017  Height:   Ht Readings from Last 1 Encounters:  10/19/17 5\' 8"  (1.727 m)    Weight:   Wt Readings from Last 1 Encounters:  10/19/17 197 lb 15.6 oz (89.8 kg)    Ideal Body Weight:  70 kg  BMI:  Body mass index is 30.1 kg/m.  Estimated Nutritional Needs:   Kcal:  1900-2200 calories  Protein:  116-134 grams  Fluid:  1.9-2.2L  Satira Anis. Aamina Skiff, MS, RD LDN Inpatient Clinical Dietitian Pager [phone]

## 2017-10-20 ENCOUNTER — Encounter (HOSPITAL_COMMUNITY)
Admission: EM | Disposition: A | Discharge status: Home / Self Care | Payer: Self-pay | Source: Home / Self Care | Attending: Internal Medicine

## 2017-10-20 ENCOUNTER — Inpatient Hospital Stay (HOSPITAL_COMMUNITY): Payer: Self-pay | Admitting: Anesthesiology

## 2017-10-20 ENCOUNTER — Encounter (HOSPITAL_COMMUNITY): Payer: Self-pay | Admitting: Anesthesiology

## 2017-10-20 ENCOUNTER — Inpatient Hospital Stay (HOSPITAL_COMMUNITY): Payer: Self-pay

## 2017-10-20 DIAGNOSIS — R066 Hiccough: Secondary | ICD-10-CM

## 2017-10-20 DIAGNOSIS — E872 Acidosis: Secondary | ICD-10-CM

## 2017-10-20 DIAGNOSIS — K921 Melena: Secondary | ICD-10-CM

## 2017-10-20 DIAGNOSIS — K210 Gastro-esophageal reflux disease with esophagitis: Secondary | ICD-10-CM

## 2017-10-20 DIAGNOSIS — K209 Esophagitis, unspecified without bleeding: Secondary | ICD-10-CM

## 2017-10-20 DIAGNOSIS — Z7984 Long term (current) use of oral hypoglycemic drugs: Secondary | ICD-10-CM

## 2017-10-20 DIAGNOSIS — R197 Diarrhea, unspecified: Secondary | ICD-10-CM

## 2017-10-20 DIAGNOSIS — Z79899 Other long term (current) drug therapy: Secondary | ICD-10-CM

## 2017-10-20 DIAGNOSIS — Z8719 Personal history of other diseases of the digestive system: Secondary | ICD-10-CM

## 2017-10-20 DIAGNOSIS — Z7289 Other problems related to lifestyle: Secondary | ICD-10-CM

## 2017-10-20 DIAGNOSIS — R1013 Epigastric pain: Secondary | ICD-10-CM

## 2017-10-20 DIAGNOSIS — N179 Acute kidney failure, unspecified: Secondary | ICD-10-CM

## 2017-10-20 DIAGNOSIS — R000 Tachycardia, unspecified: Secondary | ICD-10-CM

## 2017-10-20 DIAGNOSIS — E119 Type 2 diabetes mellitus without complications: Secondary | ICD-10-CM

## 2017-10-20 DIAGNOSIS — R195 Other fecal abnormalities: Secondary | ICD-10-CM

## 2017-10-20 DIAGNOSIS — E876 Hypokalemia: Secondary | ICD-10-CM

## 2017-10-20 DIAGNOSIS — I10 Essential (primary) hypertension: Secondary | ICD-10-CM

## 2017-10-20 HISTORY — PX: ESOPHAGOGASTRODUODENOSCOPY: SHX5428

## 2017-10-20 LAB — CBC
HEMATOCRIT: 31.9 % — AB (ref 39.0–52.0)
Hemoglobin: 10.7 g/dL — ABNORMAL LOW (ref 13.0–17.0)
MCH: 28.5 pg (ref 26.0–34.0)
MCHC: 33.5 g/dL (ref 30.0–36.0)
MCV: 84.8 fL (ref 78.0–100.0)
PLATELETS: 211 10*3/uL (ref 150–400)
RBC: 3.76 MIL/uL — ABNORMAL LOW (ref 4.22–5.81)
RDW: 13.8 % (ref 11.5–15.5)
WBC: 6.9 10*3/uL (ref 4.0–10.5)

## 2017-10-20 LAB — BASIC METABOLIC PANEL
Anion gap: 10 (ref 5–15)
BUN: 32 mg/dL — ABNORMAL HIGH (ref 6–20)
CALCIUM: 8.6 mg/dL — AB (ref 8.9–10.3)
CHLORIDE: 108 mmol/L (ref 101–111)
CO2: 25 mmol/L (ref 22–32)
CREATININE: 0.91 mg/dL (ref 0.61–1.24)
Glucose, Bld: 133 mg/dL — ABNORMAL HIGH (ref 65–99)
Potassium: 2.8 mmol/L — ABNORMAL LOW (ref 3.5–5.1)
SODIUM: 143 mmol/L (ref 135–145)

## 2017-10-20 LAB — LACTIC ACID, PLASMA: LACTIC ACID, VENOUS: 1 mmol/L (ref 0.5–1.9)

## 2017-10-20 LAB — GLUCOSE, CAPILLARY
Glucose-Capillary: 104 mg/dL — ABNORMAL HIGH (ref 65–99)
Glucose-Capillary: 143 mg/dL — ABNORMAL HIGH (ref 65–99)
Glucose-Capillary: 93 mg/dL (ref 65–99)
Glucose-Capillary: 161 mg/dL — ABNORMAL HIGH (ref 65–99)

## 2017-10-20 SURGERY — EGD (ESOPHAGOGASTRODUODENOSCOPY)
Anesthesia: Monitor Anesthesia Care

## 2017-10-20 MED ORDER — IOPAMIDOL (ISOVUE-300) INJECTION 61%
INTRAVENOUS | Status: AC
  Administered 2017-10-20: 100 mL
  Filled 2017-10-20: qty 100

## 2017-10-20 MED ORDER — POTASSIUM CHLORIDE CRYS ER 20 MEQ PO TBCR
40.0000 meq | EXTENDED_RELEASE_TABLET | Freq: Once | ORAL | Status: AC
  Administered 2017-10-20: 40 meq via ORAL
  Filled 2017-10-20: qty 2

## 2017-10-20 MED ORDER — LACTATED RINGERS IV SOLN
INTRAVENOUS | Status: DC | PRN
  Administered 2017-10-20: 14:00:00 via INTRAVENOUS

## 2017-10-20 MED ORDER — POTASSIUM CHLORIDE 10 MEQ/100ML IV SOLN
10.0000 meq | INTRAVENOUS | Status: AC
  Administered 2017-10-20 (×2): 10 meq via INTRAVENOUS
  Filled 2017-10-20 (×2): qty 100

## 2017-10-20 MED ORDER — PROPOFOL 10 MG/ML IV BOLUS
INTRAVENOUS | Status: DC | PRN
  Administered 2017-10-20 (×2): 20 mg via INTRAVENOUS

## 2017-10-20 MED ORDER — MAGNESIUM SULFATE 2 GM/50ML IV SOLN
2.0000 g | Freq: Once | INTRAVENOUS | Status: AC
  Administered 2017-10-20: 2 g via INTRAVENOUS
  Filled 2017-10-20: qty 50

## 2017-10-20 MED ORDER — PANTOPRAZOLE SODIUM 40 MG PO TBEC
40.0000 mg | DELAYED_RELEASE_TABLET | Freq: Two times a day (BID) | ORAL | Status: DC
  Administered 2017-10-20 – 2017-10-21 (×2): 40 mg via ORAL
  Filled 2017-10-20 (×2): qty 1

## 2017-10-20 MED ORDER — BARIUM SULFATE 0.1 % PO SUSP
ORAL | Status: AC
  Filled 2017-10-20: qty 3

## 2017-10-20 MED ORDER — ONDANSETRON HCL 4 MG/2ML IJ SOLN
INTRAMUSCULAR | Status: DC | PRN
  Administered 2017-10-20: 4 mg via INTRAVENOUS

## 2017-10-20 MED ORDER — LACTATED RINGERS IV SOLN
INTRAVENOUS | Status: DC
  Administered 2017-10-20: 1000 mL via INTRAVENOUS

## 2017-10-20 MED ORDER — PROPOFOL 500 MG/50ML IV EMUL
INTRAVENOUS | Status: DC | PRN
  Administered 2017-10-20: 100 ug/kg/min via INTRAVENOUS

## 2017-10-20 MED ORDER — BUTAMBEN-TETRACAINE-BENZOCAINE 2-2-14 % EX AERO
INHALATION_SPRAY | CUTANEOUS | Status: DC | PRN
  Administered 2017-10-20: 1 via TOPICAL

## 2017-10-20 MED ORDER — INSULIN GLARGINE 100 UNIT/ML ~~LOC~~ SOLN
10.0000 [IU] | Freq: Every day | SUBCUTANEOUS | Status: DC
  Administered 2017-10-20: 10 [IU] via SUBCUTANEOUS
  Filled 2017-10-20 (×2): qty 0.1

## 2017-10-20 NOTE — Anesthesia Postprocedure Evaluation (Signed)
Anesthesia Post Note  Patient: Jeremiah Bowman  Procedure(s) Performed: ESOPHAGOGASTRODUODENOSCOPY (EGD) (N/A )     Patient location during evaluation: PACU Anesthesia Type: MAC Level of consciousness: awake and alert Pain management: pain level controlled Vital Signs Assessment: post-procedure vital signs reviewed and stable Respiratory status: spontaneous breathing, nonlabored ventilation, respiratory function stable and patient connected to nasal cannula oxygen Cardiovascular status: stable and blood pressure returned to baseline Postop Assessment: no apparent nausea or vomiting Anesthetic complications: no    Last Vitals:  Vitals:   10/20/17 1420 10/20/17 1500  BP: (!) 142/90 131/83  Pulse: 87 91  Resp: 20 18  Temp:  37.1 C  SpO2: 100% 100%    Last Pain:  Vitals:   10/20/17 1411  TempSrc: Oral  PainSc:                  Ryan P Ellender

## 2017-10-20 NOTE — Discharge Summary (Signed)
Name: Jeremiah Bowman MRN: [id number] DOB: [birth year] 61 y.o. PCP: Everardo Beals, NP  Date of Admission: 10/18/2017 10:46 AM Date of Discharge: 10/21/2017 Attending Physician: Sid Falcon, MD  Discharge Diagnosis: 1. Small bowel obstruction 2. Severe ulcerative esophagitis  Discharge Medications: Allergies as of 10/21/2017      Reactions   Malarone [atovaquone-proguanil Hcl] Itching      Medication List    STOP taking these medications   metoCLOPramide 5 MG tablet Commonly known as:  REGLAN   naproxen 500 MG tablet Commonly known as:  NAPROSYN     TAKE these medications   aspirin 81 MG chewable tablet Chew 81 mg by mouth daily.   cyclobenzaprine 5 MG tablet Commonly known as:  FLEXERIL Take 5 mg by mouth 3 (three) times daily as needed for muscle spasms.   insulin glargine 100 UNIT/ML injection Commonly known as:  LANTUS Inject 0.1 mLs (10 Units total) into the skin daily.   metFORMIN 1000 MG tablet Commonly known as:  GLUCOPHAGE Take 1,000 mg by mouth 2 (two) times daily with a meal.   metoprolol tartrate 25 MG tablet Commonly known as:  LOPRESSOR Take 1 tablet (25 mg total) by mouth 2 (two) times daily.   NEEDLE (DISP) 30 G 30G X 1/2" Misc Commonly known as:  BD DISP NEEDLES 10 Units by Does not apply route daily.   oxybutynin 5 MG 24 hr tablet Commonly known as:  DITROPAN-XL Take 5 mg by mouth 2 (two) times daily.   pantoprazole 40 MG tablet Commonly known as:  PROTONIX Take 1 tablet (40 mg total) by mouth 2 (two) times daily before a meal.   pravastatin 20 MG tablet Commonly known as:  PRAVACHOL Take 20 mg by mouth daily.   ranitidine 75 MG tablet Commonly known as:  ZANTAC Take 75 mg by mouth as needed for heartburn. What changed:  Another medication with the same name was removed. Continue taking this medication, and follow the directions you see here.       Disposition and follow-up:   Jeremiah Bowman was discharged from [facility] in Good condition.  At the hospital follow up visit please address:  1.  - Any melena/BRBPR? Any abdominal pain? - Please assess adherence to insulin and metformin.  2.  Patient needs GI follow up for screening colonoscopy. GI stated that they would call him to schedule an appointment. Please ensure this is done.  3.  Labs / imaging needed at time of follow-up: None  4.  Pending labs/ test needing follow-up: None  Follow-up Appointments: Follow-up Information    Everardo Beals, NP. Schedule an appointment as soon as possible for a visit in 1 week(s).   Contact information: [facility] [place] [address] [phone]        [facility]. Call in 2 week(s).   Specialty:  Gastroenterology Why:  Their office should call you to schedule an appointment. If you do not hear from them in 1 week, please call their office to schedule an appointment. Contact information: [place] [address] [place] [facility] Course by problem list: Active Problems:   Small bowel obstruction (HCC)   Melena   Acute esophagitis   1. Small bowel obstruction Jeremiah Bowman has a PMH significant for DM and recurrent SBO and initially presented on 10/18/2017 with abdominal distention, N/V, and decreased PO intake for several  days. On CT abdominal imaging, he was found to have SBO with transition point in pelvis, possibly secondary to adhesions, although no history of abdominal surgeries. CT enterography suboptimal but without clear cause for SBO. Also with lactic acidosis, AKI and tachycardia on admission. Patient also with ?mild DKA and technically met SIRS criteria on admission with HR > 90, RR > 20, and WBC > 12K, however this was unlikely true infectious process and likely related to SBO and dehydration. He received IVF administration, NG tube, and was made NPO. General surgery was consulted  and he did not require immediate surgical intervention. His bowel function returned the next morning and his lactic acid and kidney function normalized with fluids. NG tube was removed and patient tolerating PO intake.  2. Severe ulcerative esophagitis During admission, he was noted to have a drop in hemoglobin from 14.4 to 10.7. NG tube output was black and patient endorsed coffee-ground emesis prior to admission. He denied bright red blood per rectum. Patient has never had endoscopy or colonoscopy. He also endorsed ~22lb weight loss over last 1-2 months. GI was consulted, and he underwent EGD on 10/20/2017. EGD showed severe ulcerative esophagitis, possibly secondary to vomiting in setting of SBO. Patient discharged on PO PPI BID with plan to follow up with outpatient GI.  3. DM Last A1c 10.4 in 04/2017. BG of 315, AG 20, and urine ketones of 5, suggesting possible mild DKA, which resolved with fluids. On metformin 1090m BID and discharged with 10u Lantus daily at last admission, however patient states that he often does not take these medications as prescribed. Patient discharged with lantus 10u daily and metformin 10019mBID.  Discharge Vitals:   BP (!) 140/96 (BP Location: Right Arm)   Pulse 90   Temp 98.7 F (37.1 C) (Oral)   Resp 13   Ht 5' 8" (1.727 m)   Wt 198 lb 13.7 oz (90.2 kg)   SpO2 100%   BMI 30.24 kg/m   Pertinent Labs, Studies, and Procedures:  CBC Latest Ref Rng & Units 10/21/2017 10/20/2017 10/19/2017  WBC 4.0 - 10.5 K/uL 7.2 6.9 -  Hemoglobin 13.0 - 17.0 g/dL 10.5(L) 10.7(L) 11.6(L)  Hematocrit 39.0 - 52.0 % 31.6(L) 31.9(L) 34.3(L)  Platelets 150 - 400 K/uL 211 211 -   CMP Latest Ref Rng & Units 10/21/2017 10/20/2017 10/19/2017  Glucose 65 - 99 mg/dL 98 133(H) 242(H)  BUN 6 - 20 mg/dL 11 32(H) 65(H)  Creatinine 0.61 - 1.24 mg/dL 0.64 0.91 3.12(H)  Sodium 135 - 145 mmol/L 142 143 140  Potassium 3.5 - 5.1 mmol/L 3.7 2.8(L) 3.5  Chloride 101 - 111 mmol/L 110 108 94(L)    CO2 22 - 32 mmol/L _0 Calcium 8.9 - 10.3 mg/dL 8.9 8.6(L) 9.0  Total Protein 6.5 - 8.1 g/dL - - -  Total Bilirubin 0.3 - 1.2 mg/dL - - -  Alkaline Phos 38 - 126 U/L - - -  AST 15 - 41 U/L - - -  ALT 17 - 63 U/L - - -   UA with small bilirubin, 5 ketones, 30 protein, small leukocytes, 6-30 squam epithelial, +mucus, +hyaline casts Lactic acid 4.9 ---> 1.0 Ethyl alcohol <1<16alicylate <7<1.0eNa 0.9.6%CT Abd/Pelvis 10/18/2017:  1. Examination is positive findings consistent with a small bowel obstruction with a transition point in the pelvis. The obstruction appears to be due to adhesions. No CT evidence of small bowel ischemia. 2.Diverticulosis without diverticulitis.  EGD 10/20/2017: Severe, circumferential,  friable, ulcerative esophagitis from GE junction to mid/proximal esophagus. Biopsies were not taken given classic reflux appearance, recent SBO related vomiting.The exam was otherwise without abnormality.  CT Enterography 10/20/2017: 1. The prior small bowel obstruction has resolved. I do not see a definite mass to account for the prior small bowel obstruction, and the transition point is difficult to determine on today's exam although was more obvious on 10/18/2017 given the fecalized loop of bowel in the pelvis. From that exam I would tend to favor adhesion given the suggest an of abrupt potentially angulated transition along that fecalized loop. Sensitivity for small masses is reduced because the VoLumen negative contrast medium is only present in proximal small bowel loops but has not made its way to distal small bowel loops. Moreover, assessment of the colon has some limitations because the colon is full of positive contrast medium (dilute barium). 2. Diffuse colonic diverticulosis without active diverticulitis. 3. Distal esophagitis. 4. Progressive airspace opacities in the right lower lobe, primarily from atelectasis. 5.  Aortic Atherosclerosis (ICD10-I70.0). 6. Multilevel  lumbar impingement.  Discharge Instructions: Discharge Instructions    Call MD for:  difficulty breathing, headache or visual disturbances   Complete by:  As directed    Call MD for:  extreme fatigue   Complete by:  As directed    Call MD for:  persistant dizziness or light-headedness   Complete by:  As directed    Call MD for:  persistant nausea and vomiting   Complete by:  As directed    Call MD for:  severe uncontrolled pain   Complete by:  As directed    Call MD for:  temperature >100.4   Complete by:  As directed    Diet - low sodium heart healthy   Complete by:  As directed    Increase activity slowly   Complete by:  As directed       Signed: Colbert Ewing, MD 10/21/2017, 2:16 PM   Pager: Mamie Nick [phone]

## 2017-10-20 NOTE — Op Note (Signed)
[facility] Patient Name: Jeremiah Bowman Procedure Date : 10/20/2017 MRN: [id number] Attending MD: Milus Banister , MD Date of Birth: Jan 05, 1956 CSN: [id number] Age: 61 Admit Type: Inpatient Procedure:                Upper GI endoscopy Indications:              Melena Providers:                Milus Banister, MD, Cleda Daub, RN, William Dalton, Technician Referring MD:              Medicines:                Monitored Anesthesia Care Complications:            No immediate complications. Estimated blood loss:                            None. Estimated Blood Loss:     Estimated blood loss: none. Procedure:                Pre-Anesthesia Assessment:                           - Prior to the procedure, a History and Physical                            was performed, and patient medications and                            allergies were reviewed. The patient's tolerance of                            previous anesthesia was also reviewed. The risks                            and benefits of the procedure and the sedation                            options and risks were discussed with the patient.                            All questions were answered, and informed consent                            was obtained. Prior Anticoagulants: The patient has                            taken no previous anticoagulant or antiplatelet                            agents. ASA Grade Assessment: III - A patient with                            severe systemic  disease. After reviewing the risks                            and benefits, the patient was deemed in                            satisfactory condition to undergo the procedure.                           After obtaining informed consent, the endoscope was                            passed under direct vision. Throughout the                            procedure, the patient's blood pressure, pulse, and                     oxygen saturations were monitored continuously. The                            EG-[id number] ([id number]) scope was introduced through the                            mouth, and advanced to the second part of duodenum.                            The upper GI endoscopy was accomplished without                            difficulty. The patient tolerated the procedure                            well. Scope In: Scope Out: Findings:      Severe, circumferential, friable, ulcerative esophagitis from GE       junction to mid/proximal esophagus. Biopsies were not taken given       classic reflux appearance, recent SBO related vomiting.      The exam was otherwise without abnormality. Impression:               - Acid, reflux related severe ulcerative                            esophagitis. This is almost certainly from his                            recent SBO that has resolved after conservative                            management. Moderate Sedation:      none Recommendation:           - Return patient to hospital ward for ongoing care.                           - Advance diet as tolerated.                           -  Continue present medications. PPI twice daily for                            2 months, then OK to decrease to once daily.                           - Still unclear etiology of his recurrent SBO with                            transition point in pelvis (apparent mechanical SBO                            but no risk factors for adhesive disease). I will                            order CT enterography today as this may show yet                            undetected small bowel thickeneing, masses,                            enhancement to suggest a source of his recurrent                            SBO. Procedure Code(s):        --- Professional ---                           [phone], Esophagogastroduodenoscopy, flexible,                            transoral; diagnostic,  including collection of                            specimen(s) by brushing or washing, when performed                            (separate procedure) Diagnosis Code(s):        --- Professional ---                           K21.0, Gastro-esophageal reflux disease with                            esophagitis                           K92.1, Melena (includes Hematochezia) CPT copyright 2016 American Medical Association. All rights reserved. The codes documented in this report are preliminary and upon coder review may  be revised to meet current compliance requirements. Milus Banister, MD 10/20/2017 2:13:04 PM This report has been signed electronically. Number of Addenda: 0

## 2017-10-20 NOTE — Anesthesia Preprocedure Evaluation (Addendum)
Anesthesia Evaluation  Patient identified by MRN, date of birth, ID band Patient awake    Reviewed: Allergy & Precautions, NPO status , Patient's Chart, lab work & pertinent test results  Airway Mallampati: IV  TM Distance: >3 FB Neck ROM: Full  Mouth opening: Limited Mouth Opening  Dental no notable dental hx. (+) Teeth Intact, Dental Advisory Given   Pulmonary former smoker,    Pulmonary exam normal breath sounds clear to auscultation       Cardiovascular hypertension, Pt. on home beta blockers Normal cardiovascular exam Rhythm:Regular Rate:Normal     Neuro/Psych negative neurological ROS  negative psych ROS   GI/Hepatic Neg liver ROS, GI bleed with dark NGT output, melenic fobt + stool   Endo/Other  diabetes, Insulin Dependent, Oral Hypoglycemic Agents  Renal/GU negative Renal ROS     Musculoskeletal negative musculoskeletal ROS (+)   Abdominal   Peds  Hematology  (+) anemia ,   Anesthesia Other Findings HLD Hypokalemic   Reproductive/Obstetrics                           Anesthesia Physical Anesthesia Plan  ASA: III  Anesthesia Plan: MAC   Post-op Pain Management:    Induction: Intravenous  PONV Risk Score and Plan: 1 and Propofol infusion  Airway Management Planned: Natural Airway  Additional Equipment:   Intra-op Plan:   Post-operative Plan:   Informed Consent: I have reviewed the patients History and Physical, chart, labs and discussed the procedure including the risks, benefits and alternatives for the proposed anesthesia with the patient or authorized representative who has indicated his/her understanding and acceptance.   Dental advisory given  Plan Discussed with: CRNA  Anesthesia Plan Comments:         Anesthesia Quick Evaluation

## 2017-10-20 NOTE — Interval H&P Note (Signed)
History and Physical Interval Note:  10/20/2017 1:14 PM  Jeremiah Bowman  has presented today for surgery, with the diagnosis of gi bleed with dark NGT output, melenic fobt + stool  The various methods of treatment have been discussed with the patient and family. After consideration of risks, benefits and other options for treatment, the patient has consented to  Procedure(s): ESOPHAGOGASTRODUODENOSCOPY (EGD) (N/A) as a surgical intervention .  The patient's history has been reviewed, patient examined, no change in status, stable for surgery.  I have reviewed the patient's chart and labs.  Questions were answered to the patient's satisfaction.     Milus Banister

## 2017-10-20 NOTE — Progress Notes (Signed)
Subjective:  Loose nonbloody BM x1 this morning. Tolerating clear liquid diet without nausea or emesis. Endorses mild epigastric abdominal pain today and hiccups, which are improved with thorazine. NG tube out. Patient denies dizziness or lightheadedness. Also states he snores at night but does not wake up in the middle of the night gasping for air. Has some trouble initiating sleep at night, and denies excessive daytime sleepiness.  Objective:  Vital signs in last 24 hours: Vitals:   10/19/17 0548 10/19/17 1155 10/20/17 0001 10/20/17 0629  BP: 110/88 107/74 116/72 115/75  Pulse: (!) 117 (!) 115 (!) 101 77  Resp: 20 17 12 12   Temp: 98 F (36.7 C) 98 F (36.7 C) 98.6 F (37 C) 98.3 F (36.8 C)  TempSrc: Oral Oral Oral Oral  SpO2: 97% 96% 100% 99%  Weight:   198 lb 13.7 oz (90.2 kg)   Height:       GEN: Well-appearing male lying in bed in NAD. Alert and oriented RESP: Clear to auscultation bilaterally. No wheezes, rales, or rhonchi. No increased work of breathing. CV: Tachycardic and regular rhythm. No murmurs, gallops, or rubs. No LE edema. ABD: Soft. Mild tenderness to palpation in epigastric area. Distended. Normoactive bowel sounds. EXT: No edema. Warm and well perfused. NEURO: Cranial nerves II-XII grossly intact. Able to lift all four extremities against gravity. No apparent audiovisual hallucinations. Speech fluent and appropriate. PSYCH: Patient is calm and pleasant. Appropriate affect. Well-groomed; speech is appropriate and on-subject.  Labs CBC Latest Ref Rng & Units 10/20/2017 10/19/2017 10/19/2017  WBC 4.0 - 10.5 K/uL 6.9 - 11.1(H)  Hemoglobin 13.0 - 17.0 g/dL 10.7(L) 11.6(L) 14.0  Hematocrit 39.0 - 52.0 % 31.9(L) 34.3(L) 39.8  Platelets 150 - 400 K/uL 211 - 299   CMP Latest Ref Rng & Units 10/20/2017 10/19/2017 10/18/2017  Glucose 65 - 99 mg/dL 133(H) 242(H) 315(H)  BUN 6 - 20 mg/dL 32(H) 65(H) 40(H)  Creatinine 0.61 - 1.24 mg/dL 0.91 3.12(H) 2.31(H)  Sodium 135 -  145 mmol/L 143 140 139  Potassium 3.5 - 5.1 mmol/L 2.8(L) 3.5 4.0  Chloride 101 - 111 mmol/L 108 94(L) 95(L)  CO2 22 - 32 mmol/L 25 26 24   Calcium 8.9 - 10.3 mg/dL 8.6(L) 9.0 10.4(H)  Total Protein 6.5 - 8.1 g/dL - - 8.6(H)  Total Bilirubin 0.3 - 1.2 mg/dL - - 1.1  Alkaline Phos 38 - 126 U/L - - 66  AST 15 - 41 U/L - - 27  ALT 17 - 63 U/L - - 14(L)   Assessment/Plan:  Active Problems:   Small bowel obstruction [facility])  Jeremiah Bowman is a 61yo male with PMH significant for 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 Lactic acidosis and AKI improved after fluid administration.  Recurrent SBO, resolving Confirmed on CT abd/pelvis 12/3 with transition point in pelvis, likely secondary to adhesions. Possibly secondary to mild DKA? No history of abdominal surgeries. Lactic acid and AG now normal AG. Loose BM x1 this morning. NG tube out. - Senna PRN for constipation - Zofran and phenergan PRN for N/V - Thorazine PRN for hiccups - Avoid narcotics if possible - Ambulate as able - PRN tylenol for pain - Advance diet as tolerated after EGD  FOBT positive, coffee-ground emesis With mild epigastric tenderness today on  exam. Patient has never had EGD or colonoscopy. Has had reported ~22lb weight loss over last 1-2 months. Hb dropped from 15.5 yesterday to 10.7 this morning. VSS. Patient denies bright red blood in stool. BUN elevated, concern for possible upper GI bleed. - GI consulted; appreciate their recommendations - EGD today, will f/u results - IV Protonix BID - CBC in AM  Hypokalemia, hypomagnesemia K 2.8, Mg 1.4 - 82mEq PO K x2 and 17mEq IV K - IV Mg sulfate 2g - BMET in AM  AKI, resolved Cr 3.12 -> 0.91 this morning (baseline 0.8-1.1). Likely secondary to dehydration in  setting of N/V. Patient s/p IVF administration and now tolerating PO intake.  DM Home regimen includes metformin 1000mg  BID. Per discharge summary on 7/1, patient was discharged on 10u Lantus qday. Last A1c on 04/2017 was 10.4. BG in 100s. - SSI-moderate - CBG monitoring  HTN Normotensive. Tachycardia, improving, now 90s-100s, likely secondary to dehydration. Lisinopril-HCTZ stopped at previous admission 2/2 AKI. Verapamil also discontinued. - Continue home metoprolol 25mg  BID  Alcohol use disorder Patient reports 1-2 shots of brandy daily. Last drink one day prior to admission. - CIWA with ativan - IV thiamine  Dispo: Anticipated discharge in approximately 2-3 day(s).   Colbert Ewing, MD 10/20/2017, 7:57 AM Pager: Mamie Nick [phone]

## 2017-10-20 NOTE — Progress Notes (Signed)
Patient requested something for hiccups around 0150 this AM.  Due to the PRN  medication Thorazine being kept in pharmacy; this nurse sent a page requesting for the med at 0154.  While waiting for the pharmacy to send the IV med, this nurse assist with answering call bells due to the majority of the staff assisting with bath on the unit.  Later noticed the mediation was placed by tube station when it arrived and this nurse wasn't notified.  This nurse entered the patient room and the patient was asleep with no evidence of hiccups. Later on around 0400 the patient infomred the nurse tech to tell the nurse he wanted something for his hiccups after this nurse just left patient room who was asleep at the time.  This  nurse immediately brought in the IV medication to be administered. Patient being to express how upset he was that I didn't come back to notify him whether or not it was time to administer the medication.  Apologized to the patient and explained to him that I was assisting with another patient's care and attempted to explain that he was asleep whenever the medication had arrived and its in our policy to avoid waking patients to administer PRN medications.  Patient interrupted and insisted on having another nurse.  Notified the charge nurse and another nurse was assigned to continue the care for the patient.

## 2017-10-20 NOTE — Anesthesia Procedure Notes (Signed)
Procedure Name: MAC Date/Time: 10/20/2017 1:55 PM Performed by: Jenne Campus, CRNA Pre-anesthesia Checklist: Patient identified, Emergency Drugs available, Patient being monitored and Suction available Oxygen Delivery Method: Nasal cannula

## 2017-10-20 NOTE — Progress Notes (Signed)
[facility] Progress Note     Subjective: CC:  Having mild, intermittent, central abdominal pain that is significantly improved compared to admission. Reports another BM this AM. +flatus. Tolerated clears yesterday without N/V. Currently NPO for EGD today 2/2 dark NG output and FOBT+ stool.   Objective: Vital signs in last 24 hours: Temp:  [98 F (36.7 C)-98.6 F (37 C)] 98.3 F (36.8 C) (12/05 0629) Pulse Rate:  [77-115] 77 (12/05 0629) Resp:  [12-17] 12 (12/05 0629) BP: (107-116)/(72-75) 115/75 (12/05 0629) SpO2:  [96 %-100 %] 99 % (12/05 0629) Weight:  [90.2 kg (198 lb 13.7 oz)] 90.2 kg (198 lb 13.7 oz) (12/05 0001) Last BM Date: 10/19/17  Intake/Output from previous day: 12/04 0701 - 12/05 0700 In: 1215 [I.V.:1190; IV Piggyback:25] Out: 1610 [Urine:1710] Intake/Output this shift: No intake/output data recorded.  PE: Gen:  Alert, NAD, pleasant and cooperative HEENT: pupils equal and round, EOMs I ntact  Card:  Regular rate and rhythm, pedal pulses 2+ BL Pulm:  Normal effort, clear to auscultation bilaterally Abd: Soft, non-tender, mild distention, bowel sounds present in all 4 quadrants Skin: warm and dry, no rashes  Psych: A&Ox3   Lab Results:  Recent Labs    10/19/17 0239 10/19/17 1521 10/20/17 0529  WBC 11.1*  --  6.9  HGB 14.0 11.6* 10.7*  HCT 39.8 34.3* 31.9*  PLT 299  --  211   BMET Recent Labs    10/19/17 0239 10/20/17 0529  NA 140 143  K 3.5 2.8*  CL 94* 108  CO2 26 25  GLUCOSE 242* 133*  BUN 65* 32*  CREATININE 3.12* 0.91  CALCIUM 9.0 8.6*   PT/INR No results for input(s): LABPROT, INR in the last 72 hours. CMP     Component Value Date/Time   NA 143 10/20/2017 0529   K 2.8 (L) 10/20/2017 0529   CL 108 10/20/2017 0529   CO2 25 10/20/2017 0529   GLUCOSE 133 (H) 10/20/2017 0529   BUN 32 (H) 10/20/2017 0529   CREATININE 0.91 10/20/2017 0529   CALCIUM 8.6 (L) 10/20/2017 0529   PROT 8.6 (H) 10/18/2017 1001   ALBUMIN 4.5  10/18/2017 1001   AST 27 10/18/2017 1001   ALT 14 (L) 10/18/2017 1001   ALKPHOS 66 10/18/2017 1001   BILITOT 1.1 10/18/2017 1001   GFRNONAA >60 10/20/2017 0529   GFRAA >60 10/20/2017 0529   Lipase     Component Value Date/Time   LIPASE 27 05/13/2017 1007       Studies/Results: Ct Abdomen Pelvis Wo Contrast  Result Date: 10/18/2017 CLINICAL DATA:  Abdominal pain and distention for 3 days. EXAM: CT ABDOMEN AND PELVIS WITHOUT CONTRAST TECHNIQUE: Multidetector CT imaging of the abdomen and pelvis was performed following the standard protocol without IV contrast. COMPARISON:  CT abdomen and pelvis 05/13/2017. FINDINGS: Lower chest: The distal esophagus is fluid-filled and dilated. Minimal dependent atelectasis is seen. No pleural or pericardial effusion. Heart size is normal. Hepatobiliary: No focal liver abnormality is seen. No gallstones, gallbladder wall thickening, or biliary dilatation. Pancreas: Unremarkable. No pancreatic ductal dilatation or surrounding inflammatory changes. Spleen: Normal in size without focal abnormality. Adrenals/Urinary Tract: Thickened left adrenal gland is unchanged. Right adrenal gland appears normal. Kidneys, ureters and urinary bladder are unremarkable. Stomach/Bowel: There is marked distension of the stomach. Small bowel loops are also dilated up to 4.8 cm with air-fluid levels present. Fecalized small bowel contents are seen in the pelvis where multiple loops appear adhesed together. No pneumatosis, portal  venous gas, free intraperitoneal air or fluid collection is identified. Diverticulosis of the colon without diverticulitis is noted. The appendix appears normal. Vascular/Lymphatic: No significant vascular findings are present. No enlarged abdominal or pelvic lymph nodes. Reproductive: Prostate gland is mildly prominent. Other: No ascites or hernia. Musculoskeletal: Multilevel degenerative disc disease appears worst at L3-4 and L4-5. No lytic or sclerotic lesion.  IMPRESSION: Examination is positive findings consistent with a small bowel obstruction with a transition point in the pelvis. The obstruction appears to be due to adhesions. No CT evidence of small bowel ischemia. Diverticulosis without diverticulitis. Electronically Signed   By: Inge Rise M.D.   On: 10/18/2017 13:32   Dg Abd Portable 1v  Result Date: 10/19/2017 CLINICAL DATA:  Small-bowel obstruction. History of hypertension, acute renal injury, diabetes. EXAM: PORTABLE ABDOMEN - 1 VIEW COMPARISON:  10/19/2017.  CT 10/18/2017. FINDINGS: NG tube noted in the stomach. Persistent mild gastric distention. Persistent small bowel distention cannot be excluded. Bowel distention has improved from prior CT of 10/18/2017. Oral contrast is however noted the colon. Findings suggest partial small bowel obstruction with interim improvement. No free air identified . Degenerative change thoracic spine scratched it degenerative changes lumbar spine and both hips. IMPRESSION: NG tube in the stomach. Persistent mild gastric distention. Persistent small bowel distention cannot be excluded. Oral contrast is however noted in the colon. Findings suggest partial small bowel obstruction with interim improvement . Electronically Signed   By: Marcello Moores  Register   On: 10/19/2017 12:17   Dg Abd Portable 1v-small Bowel Obstruction Protocol-initial, 8 Hr Delay  Result Date: 10/19/2017 CLINICAL DATA:  Small bowel obstruction.  8 hour delayed film. EXAM: PORTABLE ABDOMEN - 1 VIEW COMPARISON:  Body CT 10/18/2017 FINDINGS: Enteric catheter with tip likely within the antrum of a dilated stomach. Minimal opacification of small bowel loops, with ongoing small bowel obstruction. The colon is decompressed. IMPRESSION: Ongoing small bowel obstruction. Electronically Signed   By: Fidela Salisbury M.D.   On: 10/19/2017 04:27    Anti-infectives: Anti-infectives (From admission, onward)   None     Assessment/Plan HTN HLD DM2 EtOH  use AKI - improving FOBT+ stool, GIB - Hx of Naprosyn. 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  - afebrile, leukocytosis resolved, lactic acid normalized - Return of bowel function with small bowel protocol - ok to advance diet as tolerated from surgical perspective, pending EGD results. - mobilize/ IS  FEN: NPO for procedure; hypokalemia 2.8, hypomagnesemia 1.4 ID: none VTE: SCD's    LOS: 2 days    Jill Alexanders , [facility] 10/20/2017, 7:55 AM Pager: [phone] Consults: [phone] Mon-Fri 7:00 am-4:30 pm Sat-Sun 7:00 am-11:30 am

## 2017-10-20 NOTE — Transfer of Care (Signed)
Immediate Anesthesia Transfer of Care Note  Patient: Jeremiah Bowman  Procedure(s) Performed: ESOPHAGOGASTRODUODENOSCOPY (EGD) (N/A )  Patient Location: Endoscopy Unit  Anesthesia Type:MAC  Level of Consciousness: awake, oriented and patient cooperative  Airway & Oxygen Therapy: Patient Spontanous Breathing and Patient connected to nasal cannula oxygen  Post-op Assessment: Report given to RN and Post -op Vital signs reviewed and stable  Post vital signs: Reviewed and stable  Last Vitals:  Vitals:   10/20/17 0629 10/20/17 1320  BP: 115/75 106/84  Pulse: 77   Resp: 12 18  Temp: 36.8 C   SpO2: 99% 100%    Last Pain:  Vitals:   10/20/17 0629  TempSrc: Oral  PainSc:          Complications: No apparent anesthesia complications

## 2017-10-21 ENCOUNTER — Telehealth: Payer: Self-pay

## 2017-10-21 DIAGNOSIS — R933 Abnormal findings on diagnostic imaging of other parts of digestive tract: Secondary | ICD-10-CM

## 2017-10-21 DIAGNOSIS — K209 Esophagitis, unspecified: Secondary | ICD-10-CM

## 2017-10-21 LAB — BASIC METABOLIC PANEL
ANION GAP: 6 (ref 5–15)
BUN: 11 mg/dL (ref 6–20)
CALCIUM: 8.9 mg/dL (ref 8.9–10.3)
CHLORIDE: 110 mmol/L (ref 101–111)
CO2: 26 mmol/L (ref 22–32)
Creatinine, Ser: 0.64 mg/dL (ref 0.61–1.24)
GFR calc non Af Amer: >60 mL/min (ref >60)
GLUCOSE: 98 mg/dL (ref 65–99)
POTASSIUM: 3.7 mmol/L (ref 3.5–5.1)
Sodium: 142 mmol/L (ref 135–145)

## 2017-10-21 LAB — CBC
HEMATOCRIT: 31.6 % — AB (ref 39.0–52.0)
HEMOGLOBIN: 10.5 g/dL — AB (ref 13.0–17.0)
MCH: 28.2 pg (ref 26.0–34.0)
MCHC: 33.2 g/dL (ref 30.0–36.0)
MCV: 84.9 fL (ref 78.0–100.0)
Platelets: 211 10*3/uL (ref 150–400)
RBC: 3.72 MIL/uL — AB (ref 4.22–5.81)
RDW: 13.6 % (ref 11.5–15.5)
WBC: 7.2 10*3/uL (ref 4.0–10.5)

## 2017-10-21 LAB — GLUCOSE, CAPILLARY
Glucose-Capillary: 100 mg/dL — ABNORMAL HIGH (ref 65–99)
Glucose-Capillary: 145 mg/dL — ABNORMAL HIGH (ref 65–99)

## 2017-10-21 MED ORDER — POTASSIUM CHLORIDE CRYS ER 20 MEQ PO TBCR
40.0000 meq | EXTENDED_RELEASE_TABLET | Freq: Once | ORAL | Status: AC
Start: 2017-10-21 — End: 2017-10-21
  Administered 2017-10-21: 40 meq via ORAL
  Filled 2017-10-21: qty 2

## 2017-10-21 MED ORDER — PANTOPRAZOLE SODIUM 40 MG PO TBEC: 40.0000 mg | DELAYED_RELEASE_TABLET | Freq: Two times a day (BID) | ORAL | 0 refills | Status: AC

## 2017-10-21 NOTE — Telephone Encounter (Signed)
-----   Message from Milus Banister, MD sent at 10/21/2017 11:17 AM EST ----- He needs my next available ROV, hospital follow up.  Thanks  Probably going home today.

## 2017-10-21 NOTE — Progress Notes (Signed)
Subjective:  Patient reclining comfortably in bed this morning. Reports improvement in his abdominal pain and states that his abdominal distention is almost back to normal. Tolerating PO intake without N/V. Had 1 more formed BM this morning. Denies chest pain or shortness of breath. Patient is eager to go home.  Objective:  Vital signs in last 24 hours: Vitals:   10/20/17 2122 10/21/17 0041 10/21/17 0143 10/21/17 0606  BP: 131/82 132/77 122/71 138/86  Pulse: 96 89 82 88  Resp: 20  16 20   Temp: 98.6 F (37 C)  98.9 F (37.2 C) 98.7 F (37.1 C)  TempSrc:      SpO2: 99%  98% 99%  Weight:      Height:       GEN: Well-appearing male lying in bed in NAD. Alert and oriented RESP: Clear to auscultation bilaterally. No wheezes, rales, or rhonchi. No increased work of breathing. CV: Normal rate and regular rhythm. No murmurs, gallops, or rubs. ABD: Soft. Non-tender, mildly distended. Normoactive bowel sounds. No scars evident on abdomen. EXT: No edema. Warm and well perfused. NEURO: Cranial nerves II-XII grossly intact. Able to lift all four extremities against gravity. No apparent audiovisual hallucinations. Speech fluent and appropriate. PSYCH: Patient is calm and pleasant. Appropriate affect. Well-groomed; speech is appropriate and on-subject.  Labs CBC Latest Ref Rng & Units 10/21/2017 10/20/2017 10/19/2017  WBC 4.0 - 10.5 K/uL 7.2 6.9 -  Hemoglobin 13.0 - 17.0 g/dL 10.5(L) 10.7(L) 11.6(L)  Hematocrit 39.0 - 52.0 % 31.6(L) 31.9(L) 34.3(L)  Platelets 150 - 400 K/uL 211 211 -   CMP Latest Ref Rng & Units 10/21/2017 10/20/2017 10/19/2017  Glucose 65 - 99 mg/dL 98 133(H) 242(H)  BUN 6 - 20 mg/dL 11 32(H) 65(H)  Creatinine 0.61 - 1.24 mg/dL 0.64 0.91 3.12(H)  Sodium 135 - 145 mmol/L 142 143 140  Potassium 3.5 - 5.1 mmol/L 3.7 2.8(L) 3.5  Chloride 101 - 111 mmol/L 110 108 94(L)  CO2 22 - 32 mmol/L 26 25 26   Calcium 8.9 - 10.3 mg/dL 8.9 8.6(L) 9.0  Total Protein 6.5 - 8.1 g/dL - - -    Total Bilirubin 0.3 - 1.2 mg/dL - - -  Alkaline Phos 38 - 126 U/L - - -  AST 15 - 41 U/L - - -  ALT 17 - 63 U/L - - -   Assessment/Plan:  Active Problems:   Small bowel obstruction (HCC)   Melena   Acute esophagitis  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 Hb and VS stable.  Recurrent SBO, resolved Confirmed on CT abd/pelvis on admission. CT enterography 12/5 shows resolved SBO with likely etiology of adhesion. No masses evident. Mild DKA may be contributory? No history of abdominal surgeries. Lactic acid and AG now normal. Formed BM x1 this morning. - Senna PRN for constipation - Zofran and phenergan PRN for N/V - Thorazine PRN for hiccups - Avoid narcotics if possible - Ambulate as able - PRN tylenol for pain - HH diet - If patient tolerating diet after lunch without N/V, can be discharged home later this afternoon - Ambulate with assistance  Severe ulcerative esophagitis, possibly secondary to SBO On EGD 12/5. Reports improvement in his abdominal pain. Hb 10.5 this morning. VSS. GI recommended CT enterography to evaluate for occult malignancy.  CT enterography shows resolution of prior SBO, suggestion of adhesion as etiology. Sensitivity for small masses in small bowel and colon pathology limited. - GI following; appreciate their recommendations - PO Protonix BID - CBC in AM - Will need outpatient follow up with GI for colonoscopy  Hypokalemia, hypomagnesemia, improved K improved to 3.7 - 2mEq PO K x1  AKI, resolved Cr at baseline.  DM Home regimen includes metformin 1000mg  BID. Per discharge summary on 7/1, patient was discharged on 10u Lantus qday. Last A1c on 04/2017 was 10.4. BG 98. - Lantus 10u QHS - SSI-moderate - CBG monitoring  HTN Normotensive.  Tachycardia improved (HR in 80s), likely secondary to dehydration. - Continue home metoprolol 25mg  BID  Alcohol use disorder Patient reports 1-2 shots of brandy daily. Last drink one day prior to admission. - CIWA with ativan - PO thiamine  Dispo: Anticipated discharge in approximately 0-1 day(s).   Jeremiah Ewing, MD 10/21/2017, 8:43 AM Pager: Mamie Nick [phone]

## 2017-10-21 NOTE — Telephone Encounter (Signed)
There are no open appointments at this time.  The pt will be called in a week or so to set up.

## 2017-10-21 NOTE — Progress Notes (Signed)
[facility] Progress Note    Since last GI note: EGD yesterday showed ulcerative esophagitis.  He's tolerating diet advance well, no further dark stools. Eager to go home.  CT enterography yesterday. Result below.  Objective: Vital signs in last 24 hours: Temp:  [98.6 F (37 C)-98.9 F (37.2 C)] 98.7 F (37.1 C) (12/06 0606) Pulse Rate:  [82-96] 88 (12/06 0606) Resp:  [16-20] 20 (12/06 0606) BP: (106-142)/(71-90) 138/86 (12/06 0606) SpO2:  [98 %-100 %] 99 % (12/06 0606) Last BM Date: 10/20/17 General: alert and oriented times 3 Heart: regular rate and rythm Abdomen: soft, non-tender, non-distended, normal bowel sounds   Lab Results: Recent Labs    10/19/17 0239 10/19/17 1521 10/20/17 0529 10/21/17 0508  WBC 11.1*  --  6.9 7.2  HGB 14.0 11.6* 10.7* 10.5*  PLT 299  --  211 211  MCV 84.1  --  84.8 84.9   Recent Labs    10/19/17 0239 10/20/17 0529 10/21/17 0508  NA 140 143 142  K 3.5 2.8* 3.7  CL 94* 108 110  CO2 26 25 26   GLUCOSE 242* 133* 98  BUN 65* 32* 11  CREATININE 3.12* 0.91 0.64  CALCIUM 9.0 8.6* 8.9   No results for input(s): PROT, ALBUMIN, AST, ALT, ALKPHOS, BILITOT, BILIDIR, IBILI in the last 72 hours. No results for input(s): INR in the last 72 hours.   Studies/Results: Ct Entero Abd/pelvis W Contast  Result Date: 10/20/2017 CLINICAL DATA:  Recurrent small bowel obstruction. Endoscopy today revealed ulcerative esophagitis. EXAM: CT ABDOMEN AND PELVIS WITH CONTRAST (ENTEROGRAPHY) TECHNIQUE: Multidetector CT of the abdomen and pelvis during bolus administration of intravenous contrast. Negative oral contrast was given. CONTRAST:  148mL ISOVUE-300 IOPAMIDOL (ISOVUE-300) INJECTION 61% COMPARISON:  10/18/2017 FINDINGS: Lower chest: Bandlike in nodular airspace opacity in the right lower lobe, primarily from atelectasis. This was not present on 10/18/2017. Trace left pleural effusion. Mucosal enhancement in the distal esophagus correlating with  the endoscopic finding of esophagitis. A likely reactive small paraesophageal lymph node measures 0.8 cm in short axis on image 13/3. Hepatobiliary: Contracted gallbladder.  Otherwise unremarkable Pancreas: Unremarkable Spleen: Unremarkable Adrenals/Urinary Tract: Fullness of the left adrenal gland without discrete mass. The kidneys appear unremarkable. Urinary bladder normal. Stomach/Bowel: Notable diverticulosis of the ascending, transverse, descending, and sigmoid colon. No active diverticulitis. There is considerable oral contrast medium in the colon. Unfortunately there is not much in the small bowel. This bowel contrast was not present on the 10/19/2017 radiograph from 2:43 a.m. but was present in the colon on the examination from 12:02 p.m. from that same date. There is negative contrast agent in the stomach and proximal small bowel but not in the distal small bowel. There is also some makes density in the stomach. No definite abnormality of the terminal ileum is identified. Screwed nice in the prior exam from 10/18/2017, there appears to be a fecalized loop of bowel in the pelvis shown on image 68/3 which extends towards the right side where the loop of bowel is lost, this is thought to likely be the transition point. It matted loops of bowel in that location make it difficult to see the non dilated loop of bowel directly, but I suspect that there is probably an adhesion in the region. On today' s exam this whole area is relatively normal in appearance due to the lack of bowel distension. I do not see an obvious mass as a cause for the prior bowel obstruction, with the understanding that the small bowel  in this vicinity is not well distended by the negative contrast agent and accordingly sensitivity is less than for atypical CT enterogram. Vascular/Lymphatic: Aortoiliac atherosclerotic vascular disease. No pathologic adenopathy. Reproductive: Unremarkable Other: No supplemental non-categorized findings.  Musculoskeletal: Lumbar spondylosis and degenerative disc disease causing multilevel impingement. IMPRESSION: 1. The prior small bowel obstruction has resolved. I do not see a definite mass to account for the prior small bowel obstruction, and the transition point is difficult to determine on today's exam although was more obvious on 10/18/2017 given the fecalized loop of bowel in the pelvis. From that exam I would tend to favor adhesion given the suggest an of abrupt potentially angulated transition along that fecalized loop. Sensitivity for small masses is reduced because the VoLumen negative contrast medium is only present in proximal small bowel loops but has not made its way to distal small bowel loops. Moreover, assessment of the colon has some limitations because the colon is full of positive contrast medium (dilute barium). 2. Diffuse colonic diverticulosis without active diverticulitis. 3. Distal esophagitis. 4. Progressive airspace opacities in the right lower lobe, primarily from atelectasis. 5.  Aortic Atherosclerosis (ICD10-I70.0). 6. Multilevel lumbar impingement. Electronically Signed   By: Van Clines M.D.   On: 10/20/2017 20:19   Dg Abd Portable 1v  Result Date: 10/19/2017 CLINICAL DATA:  Small-bowel obstruction. History of hypertension, acute renal injury, diabetes. EXAM: PORTABLE ABDOMEN - 1 VIEW COMPARISON:  10/19/2017.  CT 10/18/2017. FINDINGS: NG tube noted in the stomach. Persistent mild gastric distention. Persistent small bowel distention cannot be excluded. Bowel distention has improved from prior CT of 10/18/2017. Oral contrast is however noted the colon. Findings suggest partial small bowel obstruction with interim improvement. No free air identified . Degenerative change thoracic spine scratched it degenerative changes lumbar spine and both hips. IMPRESSION: NG tube in the stomach. Persistent mild gastric distention. Persistent small bowel distention cannot be excluded. Oral  contrast is however noted in the colon. Findings suggest partial small bowel obstruction with interim improvement . Electronically Signed   By: Marcello Moores  Register   On: 10/19/2017 12:17     Medications: Scheduled Meds: . insulin aspart  0-15 Units Subcutaneous TID WC  . insulin glargine  10 Units Subcutaneous QHS  . LORazepam  0-4 mg Intravenous Q12H  . pantoprazole  40 mg Oral BID AC  . thiamine  100 mg Oral Daily   Or  . thiamine  100 mg Intravenous Daily   Continuous Infusions: . chlorproMAZINE (THORAZINE) IV 12.5 mg (10/20/17 0455)   PRN Meds:.acetaminophen **OR** acetaminophen, chlorproMAZINE (THORAZINE) IV, phenol, promethazine, senna-docusate    Assessment/Plan: 61 y.o. male with resolved SBO, resolved melena.  The melena was likely from severe ulcerative esophagitis that was caused by the vomiting SBO.  He should stay on PPI twice daily for now to heal the damage.    Still unclear etiology for his recurrent SBO.  CT scans including yesterday CT enterography fail to show a clear cause.  He is eating well, SBO has clearly resolved again.  OK to d/c today from my perspective. [place] office will contact him to arrange office follow up in next few weeks and from there we will plan colonoscopy for screening (has never had colon cancer screening).  I think it is unlikely that colonoscopy will explain his intermittent SBOs.   Milus Banister, MD  10/21/2017, 11:12 AM [facility] Pager [phone]

## 2017-10-21 NOTE — Progress Notes (Signed)
[name] to be D/C'd Home per MD order.  Discussed with the patient and all questions fully answered.  VSS, Skin clean, dry and intact without evidence of skin break down, no evidence of skin tears noted. IV catheter discontinued intact. Site without signs and symptoms of complications. Dressing and pressure applied.  An After Visit Summary was printed and given to the patient. Patient received prescription.  D/c education completed with patient/family including follow up instructions, medication list, d/c activities limitations if indicated, with other d/c instructions as indicated by MD - patient able to verbalize understanding, all questions fully answered.   Patient instructed to return to ED, call 911, or call MD for any changes in condition.   Patient escorted via [place], and D/C home via private auto.  Holley Raring 10/21/2017 2:31 PM

## 2017-10-21 NOTE — Progress Notes (Signed)
1 Day Post-Op   Subjective/Chief Complaint: Pt tolerating diet EGD reviewed    Objective: Vital signs in last 24 hours: Temp:  [98.6 F (37 C)-98.9 F (37.2 C)] 98.7 F (37.1 C) (12/06 0606) Pulse Rate:  [82-96] 88 (12/06 0606) Resp:  [16-20] 20 (12/06 0606) BP: (106-142)/(71-90) 138/86 (12/06 0606) SpO2:  [98 %-100 %] 99 % (12/06 0606) Last BM Date: 10/20/17  Intake/Output from previous day: 12/05 0701 - 12/06 0700 In: 680 [P.O.:680] Out: 1580 [Urine:1580] Intake/Output this shift: No intake/output data recorded.  GI: soft obese NT   Lab Results:  Recent Labs    10/20/17 0529 10/21/17 0508  WBC 6.9 7.2  HGB 10.7* 10.5*  HCT 31.9* 31.6*  PLT 211 211   BMET Recent Labs    10/20/17 0529 10/21/17 0508  NA 143 142  K 2.8* 3.7  CL 108 110  CO2 25 26  GLUCOSE 133* 98  BUN 32* 11  CREATININE 0.91 0.64  CALCIUM 8.6* 8.9   PT/INR No results for input(s): LABPROT, INR in the last 72 hours. ABG No results for input(s): PHART, HCO3 in the last 72 hours.  Invalid input(s): PCO2, PO2  Studies/Results: Ct Entero Abd/pelvis W Contast  Result Date: 10/20/2017 CLINICAL DATA:  Recurrent small bowel obstruction. Endoscopy today revealed ulcerative esophagitis. EXAM: CT ABDOMEN AND PELVIS WITH CONTRAST (ENTEROGRAPHY) TECHNIQUE: Multidetector CT of the abdomen and pelvis during bolus administration of intravenous contrast. Negative oral contrast was given. CONTRAST:  15mL ISOVUE-300 IOPAMIDOL (ISOVUE-300) INJECTION 61% COMPARISON:  10/18/2017 FINDINGS: Lower chest: Bandlike in nodular airspace opacity in the right lower lobe, primarily from atelectasis. This was not present on 10/18/2017. Trace left pleural effusion. Mucosal enhancement in the distal esophagus correlating with the endoscopic finding of esophagitis. A likely reactive small paraesophageal lymph node measures 0.8 cm in short axis on image 13/3. Hepatobiliary: Contracted gallbladder.  Otherwise unremarkable  Pancreas: Unremarkable Spleen: Unremarkable Adrenals/Urinary Tract: Fullness of the left adrenal gland without discrete mass. The kidneys appear unremarkable. Urinary bladder normal. Stomach/Bowel: Notable diverticulosis of the ascending, transverse, descending, and sigmoid colon. No active diverticulitis. There is considerable oral contrast medium in the colon. Unfortunately there is not much in the small bowel. This bowel contrast was not present on the 10/19/2017 radiograph from 2:43 a.m. but was present in the colon on the examination from 12:02 p.m. from that same date. There is negative contrast agent in the stomach and proximal small bowel but not in the distal small bowel. There is also some makes density in the stomach. No definite abnormality of the terminal ileum is identified. Screwed nice in the prior exam from 10/18/2017, there appears to be a fecalized loop of bowel in the pelvis shown on image 68/3 which extends towards the right side where the loop of bowel is lost, this is thought to likely be the transition point. It matted loops of bowel in that location make it difficult to see the non dilated loop of bowel directly, but I suspect that there is probably an adhesion in the region. On today' s exam this whole area is relatively normal in appearance due to the lack of bowel distension. I do not see an obvious mass as a cause for the prior bowel obstruction, with the understanding that the small bowel in this vicinity is not well distended by the negative contrast agent and accordingly sensitivity is less than for atypical CT enterogram. Vascular/Lymphatic: Aortoiliac atherosclerotic vascular disease. No pathologic adenopathy. Reproductive: Unremarkable Other: No supplemental non-categorized findings.  Musculoskeletal: Lumbar spondylosis and degenerative disc disease causing multilevel impingement. IMPRESSION: 1. The prior small bowel obstruction has resolved. I do not see a definite mass to account  for the prior small bowel obstruction, and the transition point is difficult to determine on today's exam although was more obvious on 10/18/2017 given the fecalized loop of bowel in the pelvis. From that exam I would tend to favor adhesion given the suggest an of abrupt potentially angulated transition along that fecalized loop. Sensitivity for small masses is reduced because the VoLumen negative contrast medium is only present in proximal small bowel loops but has not made its way to distal small bowel loops. Moreover, assessment of the colon has some limitations because the colon is full of positive contrast medium (dilute barium). 2. Diffuse colonic diverticulosis without active diverticulitis. 3. Distal esophagitis. 4. Progressive airspace opacities in the right lower lobe, primarily from atelectasis. 5.  Aortic Atherosclerosis (ICD10-I70.0). 6. Multilevel lumbar impingement. Electronically Signed   By: Van Clines M.D.   On: 10/20/2017 20:19   Dg Abd Portable 1v  Result Date: 10/19/2017 CLINICAL DATA:  Small-bowel obstruction. History of hypertension, acute renal injury, diabetes. EXAM: PORTABLE ABDOMEN - 1 VIEW COMPARISON:  10/19/2017.  CT 10/18/2017. FINDINGS: NG tube noted in the stomach. Persistent mild gastric distention. Persistent small bowel distention cannot be excluded. Bowel distention has improved from prior CT of 10/18/2017. Oral contrast is however noted the colon. Findings suggest partial small bowel obstruction with interim improvement. No free air identified . Degenerative change thoracic spine scratched it degenerative changes lumbar spine and both hips. IMPRESSION: NG tube in the stomach. Persistent mild gastric distention. Persistent small bowel distention cannot be excluded. Oral contrast is however noted in the colon. Findings suggest partial small bowel obstruction with interim improvement . Electronically Signed   By: Marcello Moores  Register   On: 10/19/2017 12:17     Anti-infectives: Anti-infectives (From admission, onward)   None      Assessment/Plan: p SBO  Advance diet   Needs colonoscopy as outpatient      LOS: 3 days    Marcello Moores A Tavien Chestnut 10/21/2017

## 2017-11-02 ENCOUNTER — Other Ambulatory Visit: Payer: Self-pay | Admitting: *Deleted

## 2017-11-02 DIAGNOSIS — M546 Pain in thoracic spine: Secondary | ICD-10-CM

## 2017-11-16 ENCOUNTER — Other Ambulatory Visit: Payer: Self-pay | Admitting: Internal Medicine

## 2017-11-17 ENCOUNTER — Ambulatory Visit
Admission: RE | Admit: 2017-11-17 | Discharge: 2017-11-17 | Disposition: A | Discharge status: Home / Self Care | Payer: Self-pay | Source: Ambulatory Visit | Attending: *Deleted | Admitting: *Deleted

## 2017-11-17 DIAGNOSIS — M546 Pain in thoracic spine: Secondary | ICD-10-CM

## 2017-11-17 MED ORDER — METHYLPREDNISOLONE ACETATE 40 MG/ML INJ SUSP (RADIOLOG
120.0000 mg | Freq: Once | INTRAMUSCULAR | Status: AC
  Administered 2017-11-17: 120 mg via EPIDURAL

## 2017-11-17 MED ORDER — IOPAMIDOL (ISOVUE-M 200) INJECTION 41%
1.0000 mL | Freq: Once | INTRAMUSCULAR | Status: AC
  Administered 2017-11-17: 1 mL via EPIDURAL

## 2017-11-17 NOTE — Discharge Instructions (Signed)

## 2018-01-04 ENCOUNTER — Encounter: Payer: Self-pay | Admitting: Gastroenterology

## 2018-01-04 ENCOUNTER — Ambulatory Visit: Payer: Self-pay | Admitting: Gastroenterology

## 2018-01-04 VITALS — BP 126/80 | HR 92 | Ht 64.75 in | Wt 194.1 lb

## 2018-01-04 DIAGNOSIS — K56609 Unspecified intestinal obstruction, unspecified as to partial versus complete obstruction: Secondary | ICD-10-CM

## 2018-01-04 DIAGNOSIS — Z1211 Encounter for screening for malignant neoplasm of colon: Secondary | ICD-10-CM

## 2018-01-04 MED ORDER — PEG 3350-KCL-NA BICARB-NACL 420 G PO SOLR: 4000.0000 mL | Freq: Once | ORAL | 0 refills | Status: AC

## 2018-01-04 NOTE — Patient Instructions (Addendum)
Decrease protonix to once a day dosing.  You will be set up for a colonoscopy for colon cancer screening.  SBFT for 2 recent small bowel obstructions without clear etiology.  You have been scheduled for a small bowel follow thru at [facility]. Your appointment is on 01/07/18 at 1030 am. Please arrive 15 minutes prior to your test for registration. Make certain not to have anything to eat or drink starting midnight on the night before your test. If for some reason you need to reschedule your test, please call radiology at [phone]. ________________________________________________________________ The Small Bowel Follow Thru examination is used to visualize the entire small bowel (intestines); specifically the connection between the small and large intestine. You will be positioned on a flat x-ray table and an image of your abdomen taken. Then the technologist will show the x-ray to the radiologist. The radiologist will instruct your technologist how much (1-2 cups) barium sulfate you will drink and when to begin taking the timed x-rays, usually 15-30 minutes after you begin drinking. Barium is a harmless substance that will highlight your small intestine by absorbing x-ray. The taste is chalky and it feels very heavy both in the cup and in your stomach.  After the first x-ray is taken and shown to the radiologist, he/she will determine when the next image is to be taken. This is repeated until the barium has reached the end of the small intestine and enters the beginning of the colon (cecum). At such time when the barium spills into the colon, you will be positioned on the x-ray table once again. The radiologist will use a fluoroscopic camera to take some detailed pictures of the connection between your small intestine and colon. The fluoroscope is an x-ray unit that works with a television/computer screen. The radiologist will apply pressure to your abdomen with his/her hand and a lead glove, a  plastic paddle, or a paddle with an inflated rubber balloon on the end. This is to spread apart your loops of intestine so he/she can see all areas.   This test typically takes around 1 hour to complete.  **Important** Drink plenty of water (8-10 cups/day) for a few days following the procedure to avoid constipation and blockage. The barium will make your stools white for a few days. ____________________________________________________________________  Jeremiah Bowman may need further testing pending those results.  Normal BMI (Body Mass Index- based on height and weight) is between 19 and 25. Your BMI today is Body mass index is 32.55 kg/m. Marland Kitchen Please consider follow up  regarding your BMI with your Primary Care Provider.

## 2018-01-04 NOTE — Progress Notes (Signed)
Review of pertinent gastrointestinal problems: 1.  Recurrent small bowel obstruction, unclear etiology.  In June and December 2018 he had acute abdominal pain with small bowel obstruction on CT scan with a transition point in the pelvis.  Radiologist felt this was most consistent with adhesive disease.  Symptoms improved with conservative management both times. CT enterography 10/2017 no clear etiology. 2.  EGD December 2018 after small bowel obstruction resolution he had some GI bleeding.  EGD revealed severe ulcerative esophagitis.    HPI: This is a very pleasant 62 year old man whom I last saw when he was hospitalized for his second small bowel obstruction.  That was in December.  since then he has been completely back to normal.  He has no abdominal pains.  He was having some likely ulcerative esophagitis related chest discomfort which improved.  He has been taking proton pump inhibitor twice daily since hospitalization.  He has had 2 episodes of bowel obstructions confirmed by CT with a transition point somewhere in his pelvis.  No clear etiology.  He has never had abdominal surgery.  Before, between and since his bowel obstruction episodes he feels absolutely fine.   He has however lost about 20 pounds since November  Chief complaint is recurrent small bowel obstruction  ROS: complete GI ROS as described in HPI, all other review negative.  Constitutional:  No unintentional weight loss   Past Medical History:  Diagnosis Date  . Acute kidney injury ([place])   . Diabetes ([place])   . DKA (diabetic ketoacidoses) ([place])   . Hyperlipidemia   . Hypertension   . Small bowel obstruction ([place]) 10/2015    Past Surgical History:  Procedure Laterality Date  . ESOPHAGOGASTRODUODENOSCOPY N/A 10/20/2017   Procedure: ESOPHAGOGASTRODUODENOSCOPY (EGD);  Surgeon: Milus Banister, MD;  Location: [facility];  Service: Endoscopy;  Laterality: N/A;  . TONSILLECTOMY     patient denies  . WRIST SURGERY       Current Outpatient Medications  Medication Sig Dispense Refill  . aspirin 81 MG chewable tablet Chew 81 mg by mouth daily.    . cyclobenzaprine (FLEXERIL) 5 MG tablet Take 5 mg by mouth 3 (three) times daily as needed for muscle spasms.    . insulin glargine (LANTUS) 100 UNIT/ML injection Inject 0.1 mLs (10 Units total) into the skin daily. 10 mL 0  . metFORMIN (GLUCOPHAGE) 1000 MG tablet Take 1,000 mg by mouth 2 (two) times daily with a meal.    . NEEDLE, DISP, 30 G (BD DISP NEEDLES) 30G X 1/2" MISC 10 Units by Does not apply route daily. 200 each 0  . oxybutynin (DITROPAN-XL) 5 MG 24 hr tablet Take 5 mg by mouth 2 (two) times daily.    . pantoprazole (PROTONIX) 40 MG tablet Take 1 tablet (40 mg total) by mouth 2 (two) times daily before a meal. 60 tablet 0  . pravastatin (PRAVACHOL) 20 MG tablet Take 20 mg by mouth daily.    . ranitidine (ZANTAC) 75 MG tablet Take 75 mg by mouth as needed for heartburn.     No current facility-administered medications for this visit.     Allergies as of 01/04/2018 - Review Complete 01/04/2018  Allergen Reaction Noted  . Malarone [atovaquone-proguanil hcl] Itching 10/18/2017    Family History  Problem Relation Age of Onset  . Hypertension Mother   . Hypertension Father     Social History   Socioeconomic History  . Marital status: Married    Spouse name: Not on file  .  Number of children: Not on file  . Years of education: Not on file  . Highest education level: Not on file  Social Needs  . Financial resource strain: Not on file  . Food insecurity - worry: Not on file  . Food insecurity - inability: Not on file  . Transportation needs - medical: Not on file  . Transportation needs - non-medical: Not on file  Occupational History  . Not on file  Tobacco Use  . Smoking status: Former Research scientist (life sciences)  . Smokeless tobacco: Never Used  Substance and Sexual Activity  . Alcohol use: Yes    Alcohol/week: 9.6 oz    Types: 2 Cans of beer, 14 Shots  of liquor per week  . Drug use: No  . Sexual activity: Not on file  Other Topics Concern  . Not on file  Social History Narrative  . Not on file     Physical Exam: Ht 5' 4.75" (1.645 m) Comment: height measured without shoes  Wt 194 lb 2 oz (88.1 kg)   BMI 32.55 kg/m  Constitutional: generally well-appearing Psychiatric: alert and oriented x3 Abdomen: soft, nontender, nondistended, no obvious ascites, no peritoneal signs, normal bowel sounds No peripheral edema noted in lower extremities  Assessment and plan: 62 y.o. male with recurrent small bowel obstruction  Unclear etiology.  He has 0 GI symptoms before between and since these 2 discrete bowel obstruction episodes one in June 2018 and one in December 2018.  CT scans twice and CT enterography failed to show a clear etiology.  I am concerned given the lack of previous abdominal surgery and since he has lost about 20 pounds in the past 3 or 4 months.  I recommended continued testing.  I am going to start with colonoscopy to get him up-to-date on colon cancer screening and I will also arrange for small bowel follow-through examination.  If that shows no cyst clear sign of stenosis or stricture then I will probably also have him get pill camera study. He will decrease his PPI to once daily for now.   Please see the "Patient Instructions" section for addition details about the plan.  Owens Loffler, MD [facility] 01/04/2018, 3:48 PM

## 2018-01-07 ENCOUNTER — Ambulatory Visit (HOSPITAL_COMMUNITY): Admission: RE | Admit: 2018-01-07 | Payer: Self-pay | Source: Ambulatory Visit

## 2018-01-12 ENCOUNTER — Ambulatory Visit (HOSPITAL_COMMUNITY)
Admission: RE | Admit: 2018-01-12 | Discharge: 2018-01-12 | Disposition: A | Discharge status: Home / Self Care | Payer: Self-pay | Source: Ambulatory Visit | Attending: Gastroenterology | Admitting: Gastroenterology

## 2018-01-12 DIAGNOSIS — K56609 Unspecified intestinal obstruction, unspecified as to partial versus complete obstruction: Secondary | ICD-10-CM | POA: Insufficient documentation

## 2018-01-12 DIAGNOSIS — K5730 Diverticulosis of large intestine without perforation or abscess without bleeding: Secondary | ICD-10-CM | POA: Insufficient documentation

## 2018-01-12 DIAGNOSIS — Z1211 Encounter for screening for malignant neoplasm of colon: Secondary | ICD-10-CM

## 2018-01-14 ENCOUNTER — Other Ambulatory Visit: Payer: Self-pay

## 2018-01-14 DIAGNOSIS — K56609 Unspecified intestinal obstruction, unspecified as to partial versus complete obstruction: Secondary | ICD-10-CM

## 2018-01-14 DIAGNOSIS — R634 Abnormal weight loss: Secondary | ICD-10-CM

## 2018-01-14 DIAGNOSIS — R112 Nausea with vomiting, unspecified: Secondary | ICD-10-CM

## 2018-01-31 ENCOUNTER — Ambulatory Visit (HOSPITAL_COMMUNITY)
Admission: RE | Admit: 2018-01-31 | Discharge: 2018-01-31 | Disposition: A | Discharge status: Home / Self Care | Payer: Self-pay | Source: Ambulatory Visit | Attending: Gastroenterology | Admitting: Gastroenterology

## 2018-01-31 ENCOUNTER — Encounter: Payer: Self-pay | Admitting: Gastroenterology

## 2018-01-31 ENCOUNTER — Encounter (HOSPITAL_COMMUNITY): Payer: Self-pay | Admitting: Gastroenterology

## 2018-01-31 ENCOUNTER — Encounter (HOSPITAL_COMMUNITY)
Admission: RE | Disposition: A | Discharge status: Home / Self Care | Payer: Self-pay | Source: Ambulatory Visit | Attending: Gastroenterology

## 2018-01-31 DIAGNOSIS — R634 Abnormal weight loss: Secondary | ICD-10-CM | POA: Insufficient documentation

## 2018-01-31 DIAGNOSIS — Z8719 Personal history of other diseases of the digestive system: Secondary | ICD-10-CM | POA: Insufficient documentation

## 2018-01-31 HISTORY — PX: GIVENS CAPSULE STUDY: SHX5432

## 2018-01-31 SURGERY — IMAGING PROCEDURE, GI TRACT, INTRALUMINAL, VIA CAPSULE
Anesthesia: LOCAL

## 2018-01-31 SURGICAL SUPPLY — 1 items: TOWEL COTTON PACK 4EA (MISCELLANEOUS) ×4 IMPLANT

## 2018-01-31 NOTE — Progress Notes (Signed)
Call received from patient regarding outpatient givens capsule he is doing today. Stated blinking light on top of recorder is orange, recorder has blue hourglass and green check on screen. Advised patient that study has completed and he may remove equipment. He expresses understanding of this.

## 2018-01-31 NOTE — Progress Notes (Signed)
Patient ingested givens capsule for small bowel endoscopy 01/31/2018 at 0945. Written and verbal instructions given to patient. Patient expresses understanding and agrees. He will return equipment to endoscopy 02/01/18 AM

## 2018-02-16 ENCOUNTER — Telehealth: Payer: Self-pay | Admitting: Gastroenterology

## 2018-02-16 NOTE — Telephone Encounter (Signed)
Capsule Endoscopy 01/31/18 - no pathology noted to account for small bowel obstruction, good prep   Linna Hoff, FYI, sorry capsule result took a while to get to you. It's been difficult to get into the system at [facility].   Richardson Landry

## 2018-02-17 NOTE — Telephone Encounter (Signed)
Pt calling requesting capsule results. Best call back # [phone].

## 2018-02-17 NOTE — Telephone Encounter (Signed)
Richardson Landry, Thanks.  I talked to Amy about all the cone issues. Thanks for reading it.  Patty, Can you let him know that the capsule looked normal. He needs rov my next available appt to discuss further.  Thanks

## 2018-02-17 NOTE — Telephone Encounter (Signed)
The patient has been notified of this information and all questions answered. The pt states he will keep the appt for colon and make a follow up appt after that if still needed.

## 2018-03-01 ENCOUNTER — Encounter: Payer: Self-pay | Admitting: Gastroenterology

## 2018-03-01 ENCOUNTER — Ambulatory Visit (AMBULATORY_SURGERY_CENTER): Payer: Self-pay | Admitting: Gastroenterology

## 2018-03-01 ENCOUNTER — Other Ambulatory Visit: Payer: Self-pay

## 2018-03-01 ENCOUNTER — Telehealth: Payer: Self-pay

## 2018-03-01 VITALS — BP 131/83 | HR 67 | Temp 98.6°F | Resp 10 | Ht 64.75 in | Wt 194.0 lb

## 2018-03-01 DIAGNOSIS — K5730 Diverticulosis of large intestine without perforation or abscess without bleeding: Secondary | ICD-10-CM

## 2018-03-01 DIAGNOSIS — D123 Benign neoplasm of transverse colon: Secondary | ICD-10-CM

## 2018-03-01 DIAGNOSIS — Z1211 Encounter for screening for malignant neoplasm of colon: Secondary | ICD-10-CM

## 2018-03-01 DIAGNOSIS — K649 Unspecified hemorrhoids: Secondary | ICD-10-CM

## 2018-03-01 MED ORDER — SODIUM CHLORIDE 0.9 % IV SOLN: 500.0000 mL | Freq: Once | INTRAVENOUS | Status: DC

## 2018-03-01 NOTE — Patient Instructions (Signed)
HANDOUTS GIVEN FOR POLYPS, DIVERTICULOSIS AND HEMORRHOIDS  YOU HAD AN ENDOSCOPIC PROCEDURE TODAY AT THE [facility]:   Refer to the procedure report that was given to you for any specific questions about what was found during the examination.  If the procedure report does not answer your questions, please call your gastroenterologist to clarify.  If you requested that your care partner not be given the details of your procedure findings, then the procedure report has been included in a sealed envelope for you to review at your convenience later.  YOU SHOULD EXPECT: Some feelings of bloating in the abdomen. Passage of more gas than usual.  Walking can help get rid of the air that was put into your GI tract during the procedure and reduce the bloating. If you had a lower endoscopy (such as a colonoscopy or flexible sigmoidoscopy) you may notice spotting of blood in your stool or on the toilet paper. If you underwent a bowel prep for your procedure, you may not have a normal bowel movement for a few days.  Please Note:  You might notice some irritation and congestion in your nose or some drainage.  This is from the oxygen used during your procedure.  There is no need for concern and it should clear up in a day or so.  SYMPTOMS TO REPORT IMMEDIATELY:   Following lower endoscopy (colonoscopy or flexible sigmoidoscopy):  Excessive amounts of blood in the stool  Significant tenderness or worsening of abdominal pains  Swelling of the abdomen that is new, acute  Fever of 100F or higher  For urgent or emergent issues, a gastroenterologist can be reached at any hour by calling [phone].   DIET:  We do recommend a small meal at first, but then you may proceed to your regular diet.  Drink plenty of fluids but you should avoid alcoholic beverages for 24 hours.  ACTIVITY:  You should plan to take it easy for the rest of today and you should NOT DRIVE or use heavy machinery until tomorrow  (because of the sedation medicines used during the test).    FOLLOW UP: Our staff will call the number listed on your records the next business day following your procedure to check on you and address any questions or concerns that you may have regarding the information given to you following your procedure. If we do not reach you, we will leave a message.  However, if you are feeling well and you are not experiencing any problems, there is no need to return our call.  We will assume that you have returned to your regular daily activities without incident.  If any biopsies were taken you will be contacted by phone or by letter within the next 1-3 weeks.  Please call us at [phone] if you have not heard about the biopsies in 3 weeks.    SIGNATURES/CONFIDENTIALITY: You and/or your care partner have signed paperwork which will be entered into your electronic medical record.  These signatures attest to the fact that that the information above on your After Visit Summary has been reviewed and is understood.  Full responsibility of the confidentiality of this discharge information lies with you and/or your care-partner. 

## 2018-03-01 NOTE — Op Note (Signed)
[place] Patient Name: Jeremiah Bowman Procedure Date: 03/01/2018 7:51 AM MRN: [id number] Endoscopist: Milus Banister , MD Age: 62 Referring MD:  Date of Birth: 1955-12-27 Gender: Male Account #: [id number] Procedure:                Colonoscopy Indications:              Screening for colorectal malignant neoplasm Medicines:                Monitored Anesthesia Care Procedure:                Pre-Anesthesia Assessment:                           - Prior to the procedure, a History and Physical                            was performed, and patient medications and                            allergies were reviewed. The patient's tolerance of                            previous anesthesia was also reviewed. The risks                            and benefits of the procedure and the sedation                            options and risks were discussed with the patient.                            All questions were answered, and informed consent                            was obtained. Prior Anticoagulants: The patient has                            taken no previous anticoagulant or antiplatelet                            agents. ASA Grade Assessment: II - A patient with                            mild systemic disease. After reviewing the risks                            and benefits, the patient was deemed in                            satisfactory condition to undergo the procedure.                           After obtaining informed consent, the colonoscope  was passed under direct vision. Throughout the                            procedure, the patient's blood pressure, pulse, and                            oxygen saturations were monitored continuously. The                            Colonoscope was introduced through the anus and                            advanced to the the cecum, identified by                            appendiceal orifice and  ileocecal valve. The                            colonoscopy was performed without difficulty. The                            patient tolerated the procedure well. The quality                            of the bowel preparation was good. The ileocecal                            valve, appendiceal orifice, and rectum were                            photographed. Scope In: 7:57:32 AM Scope Out: 8:12:20 AM Scope Withdrawal Time: 0 hours 10 minutes 33 seconds  Total Procedure Duration: 0 hours 14 minutes 48 seconds  Findings:                 Two sessile polyps were found in the transverse                            colon. The polyps were 1 to 2 mm in size. These                            polyps were removed with a cold snare. Resection                            and retrieval were complete.                           Diverticulosis throughout the colon.                           External and internal hemorrhoids were found. The                            hemorrhoids were small.  The exam was otherwise without abnormality on                            direct and retroflexion views. Complications:            No immediate complications. Estimated blood loss:                            None. Estimated Blood Loss:     Estimated blood loss: none. Impression:               - Two 1 to 2 mm polyps in the transverse colon,                            removed with a cold snare. Resected and retrieved.                           - Diverticulosis.                           - External and internal hemorrhoids.                           - The examination was otherwise normal on direct                            and retroflexion views. Recommendation:           - Patient has a contact number available for                            emergencies. The signs and symptoms of potential                            delayed complications were discussed with the                             patient. Return to normal activities tomorrow.                            Written discharge instructions were provided to the                            patient.                           - Resume previous diet.                           - Continue present medications.                           You will receive a letter within 2-3 weeks with the                            pathology results and my final recommendations.  If the polyp(s) is proven to be 'pre-cancerous' on                            pathology, you will need repeat colonoscopy in 5                            years. If the polyp(s) is NOT 'precancerous' on                            pathology then you should repeat colon cancer                            screening in 10 years with colonoscopy without need                            for colon cancer screening by any method prior to                            then (including stool testing). Milus Banister, MD 03/01/2018 8:17:17 AM This report has been signed electronically.

## 2018-03-01 NOTE — Progress Notes (Signed)
Pt's states no medical or surgical changes since previsit or office visit. 

## 2018-03-01 NOTE — Telephone Encounter (Signed)
The report was mailed to the pt home as requested.

## 2018-03-01 NOTE — Progress Notes (Signed)
Spontaneous respirations throughout. VSS. Resting comfortably. To PACU on room air. Report to  RN. 

## 2018-03-01 NOTE — Telephone Encounter (Signed)
-----   Message from Milus Banister, MD sent at 03/01/2018  8:25 AM EDT ----- He is asking for a copy of his capsule endo report.  Can you help with that.  I know it is available in hospital givens system but not sure that it has moved over to epic.  I told him we would mail a copy to him.  Thanks

## 2018-03-01 NOTE — Progress Notes (Signed)
Called to room to assist during endoscopic procedure.  Patient ID and intended procedure confirmed with present staff. Received instructions for my participation in the procedure from the performing physician.  

## 2018-03-02 ENCOUNTER — Telehealth: Payer: Self-pay

## 2018-03-02 NOTE — Telephone Encounter (Signed)
  Follow up Call-  Call back number 03/01/2018  Post procedure Call Back phone  # [phone]  Permission to leave phone message Yes  Some recent data might be hidden     Patient questions:  Do you have a fever, pain , or abdominal swelling? No. Pain Score  0 *  Have you tolerated food without any problems? Yes.    Have you been able to return to your normal activities? Yes.    Do you have any questions about your discharge instructions: Diet   No. Medications  No. Follow up visit  No.  Do you have questions or concerns about your Care? No.  Actions: * If pain score is 4 or above: No action needed, pain <4.  No problems noted per pt. maw

## 2018-03-08 ENCOUNTER — Encounter: Payer: Self-pay | Admitting: Gastroenterology

## 2019-02-23 IMAGING — DX DG ABD PORTABLE 1V
1 series · 1 of 1 positions shown · non-contrast
Comparison: 05/13/2017 .

CLINICAL DATA: NG tube placement.

EXAM:
PORTABLE ABDOMEN - 1 VIEW

[abdomen kub]
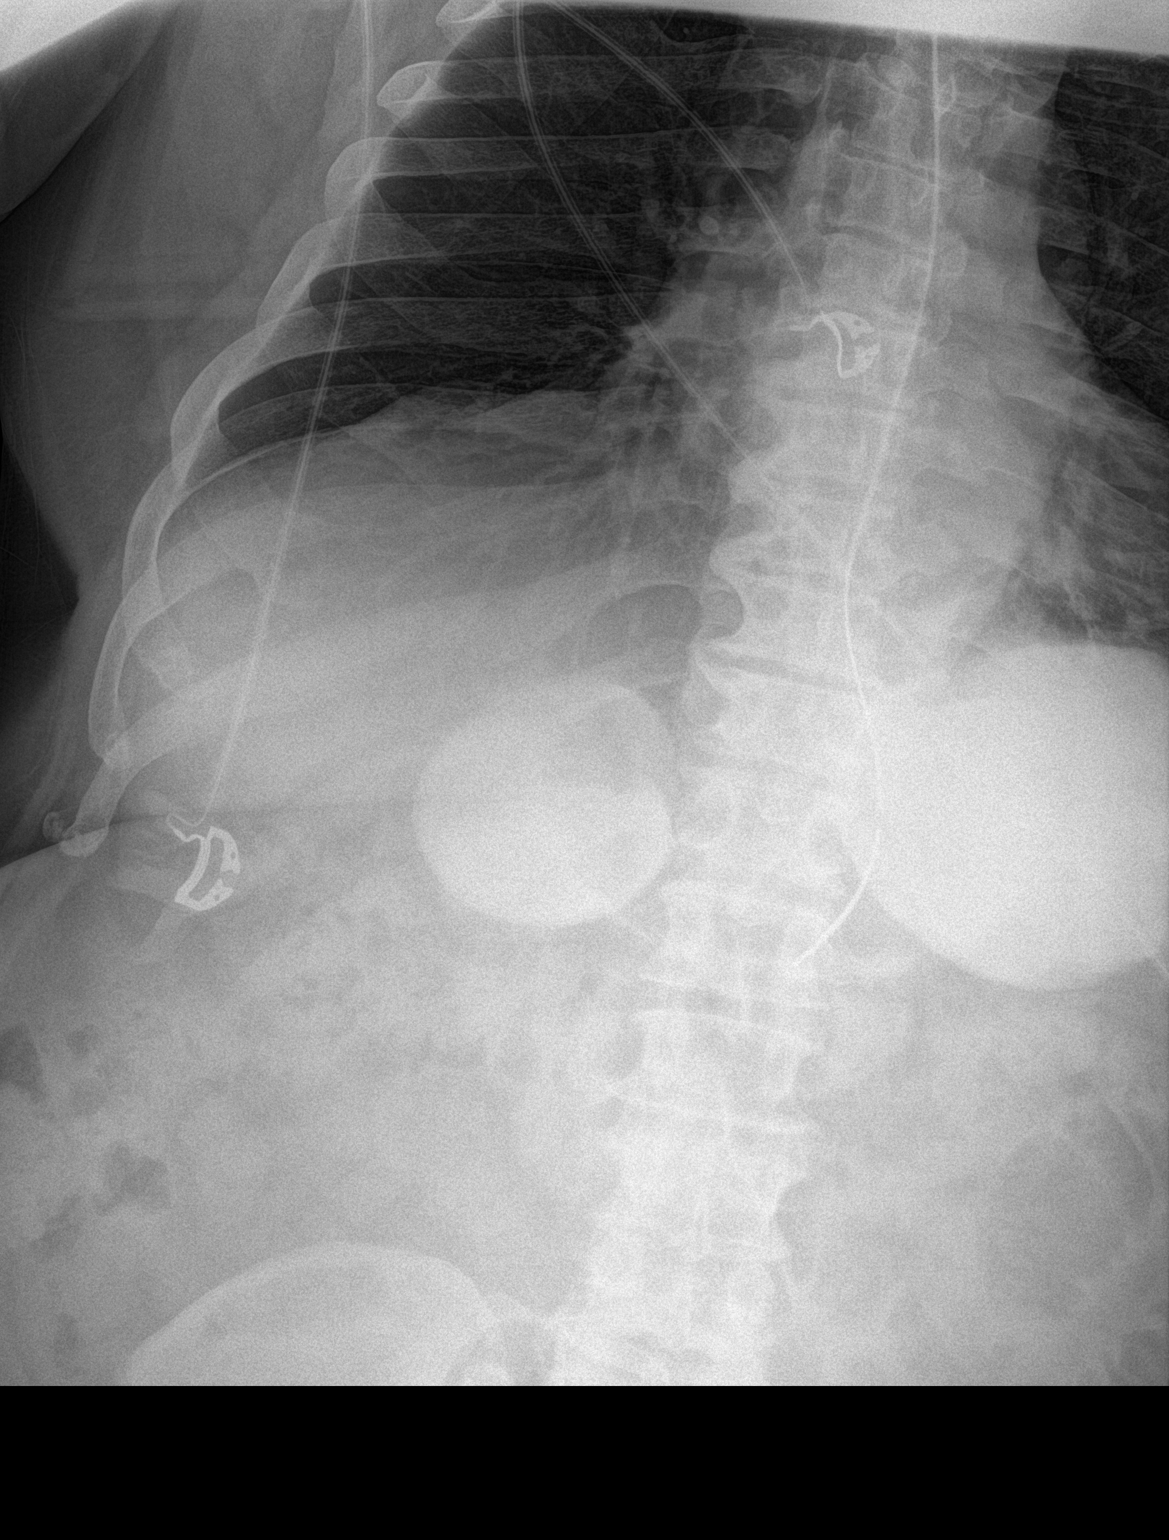

[1 of 1 positions shown; findings below may reference images not displayed]

FINDINGS: NG tube noted with tip projected in the stomach. Radiopacities noted
over the stomach may represent contrast. No gastric distention.
Basilar atelectasis. Degenerative changes thoracolumbar spine.
IMPRESSION: NG tube noted with tip in the stomach.  No gastric distention .

## 2019-07-30 IMAGING — CT CT ABD-PELV W/O CM
2 of 4 series · 16 of 46 positions shown, 18 images · non-contrast
Comparison: CT abdomen and pelvis 05/13/2017.

CLINICAL DATA: Abdominal pain and distention for 3 days.

EXAM:
CT ABDOMEN AND PELVIS WITHOUT CONTRAST
TECHNIQUE: Multidetector CT imaging of the abdomen and pelvis was performed
following the standard protocol without IV contrast.

[Series 3: ap without · axial · non-contrast · 0.90mm/px · z∈[+645,+1055]mm · 13 of 94 slices shown, 15 images]
[im 6/94  soft-tissue]
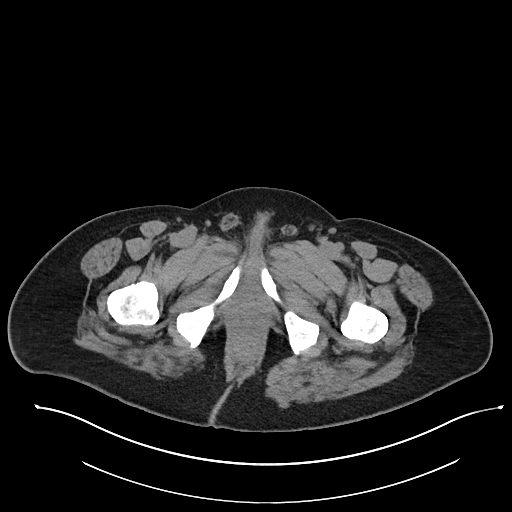
[im 6/94  bone]
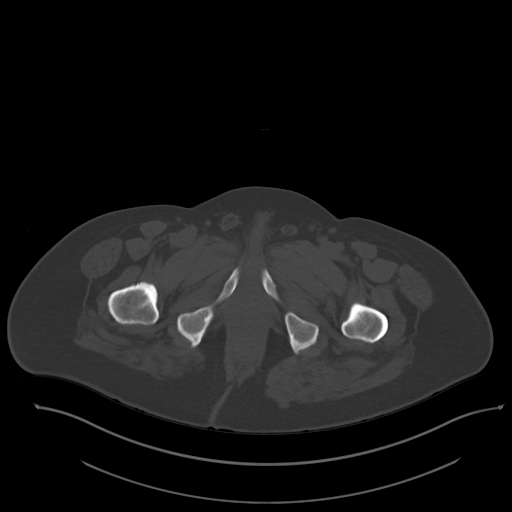
[im 11/94  soft-tissue]
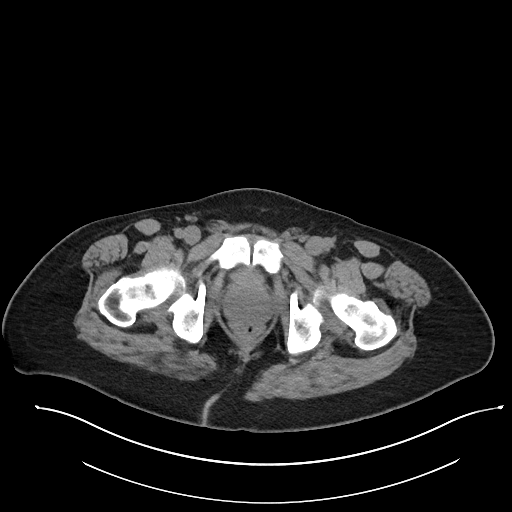
[im 21/94  soft-tissue]
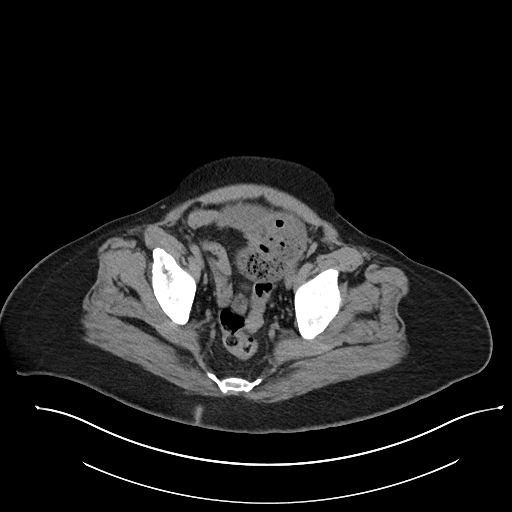
[im 26/94  soft-tissue]
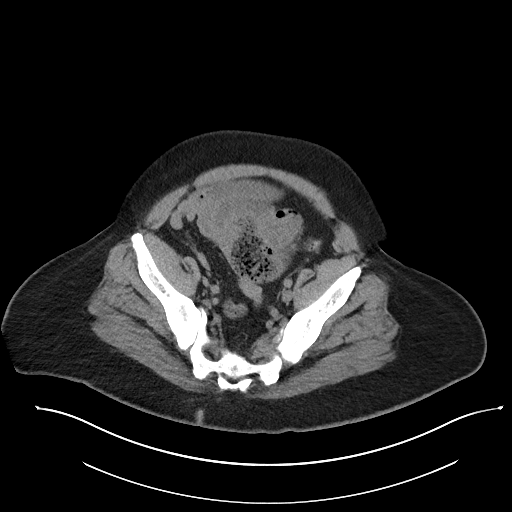
[im 32/94  soft-tissue]
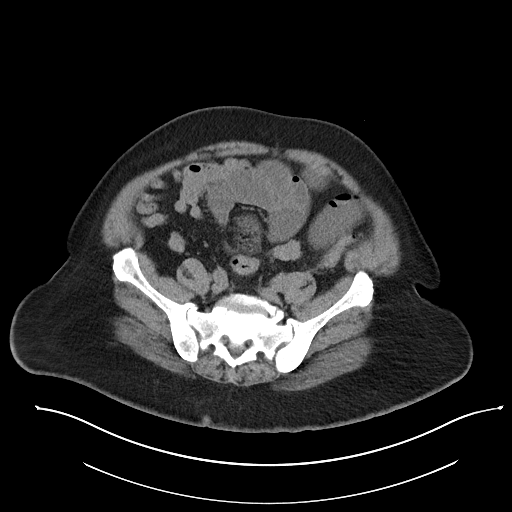
[im 42/94  soft-tissue]
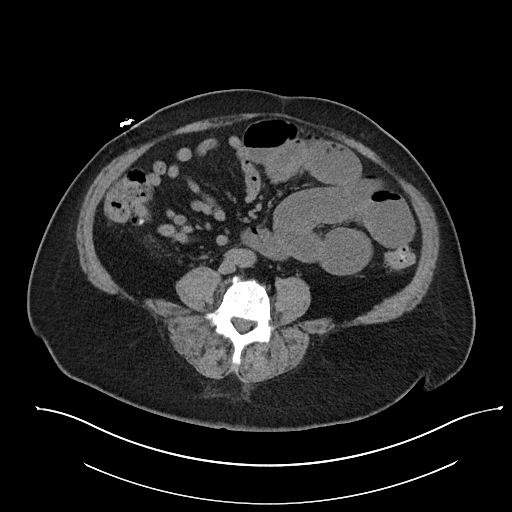
[im 47/94  soft-tissue]
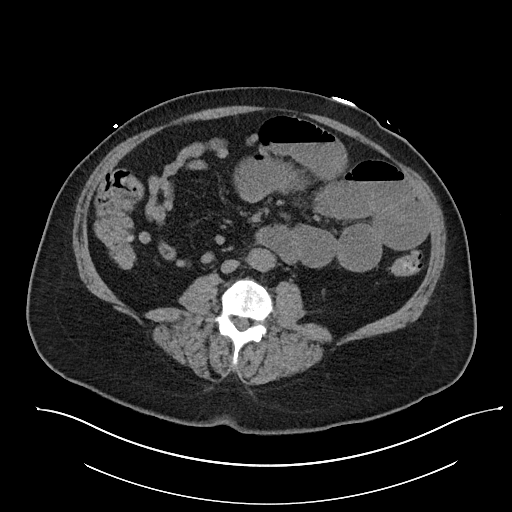
[im 52/94  soft-tissue]
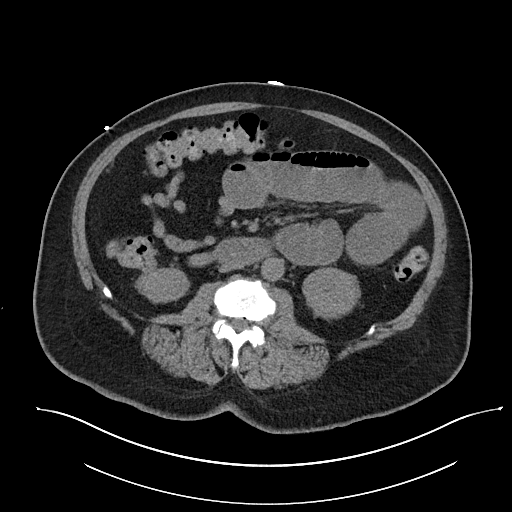
[im 63/94  soft-tissue]
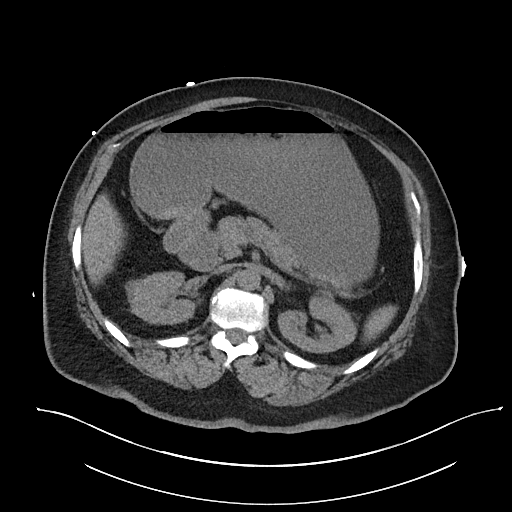
[im 63/94  bone]
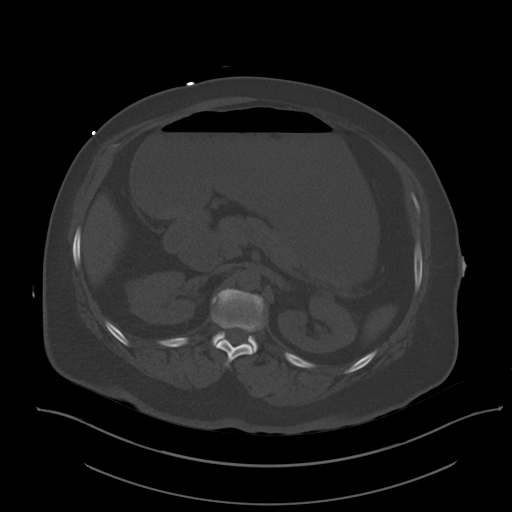
[im 68/94  soft-tissue]
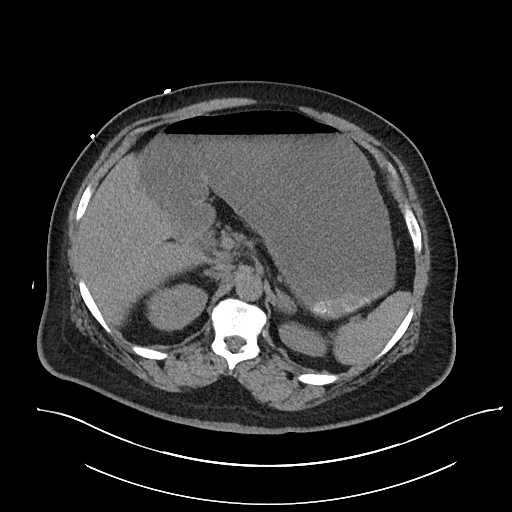
[im 73/94  soft-tissue]
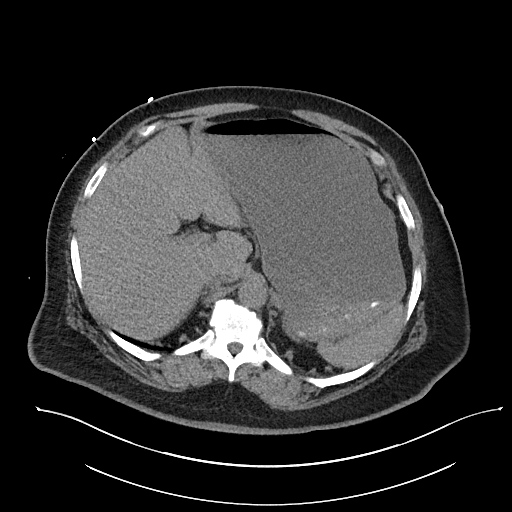
[im 83/94  soft-tissue]
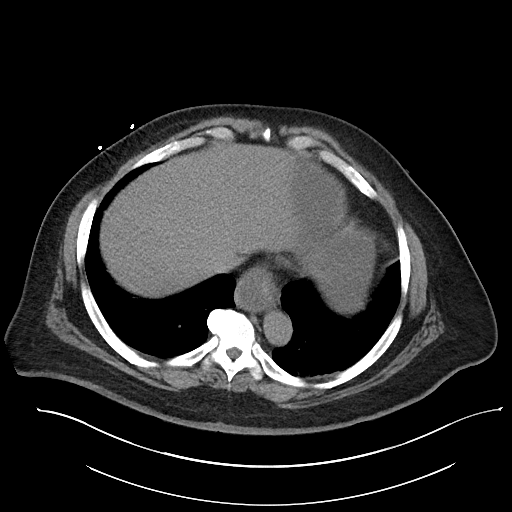
[im 88/94  soft-tissue]
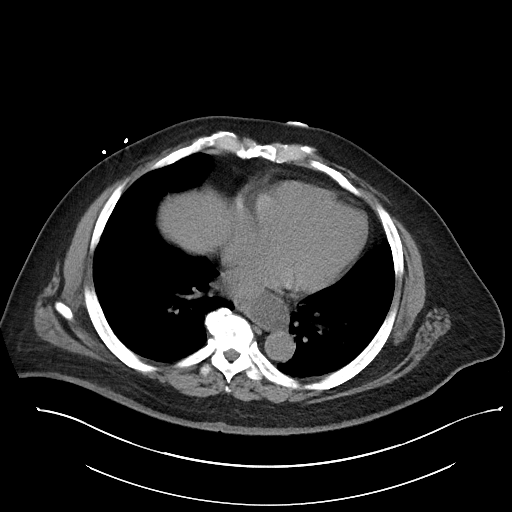

[Series 6: cor · coronal · 0.87mm/px · 3 of 104 slices shown]
[im 35/104  soft-tissue]
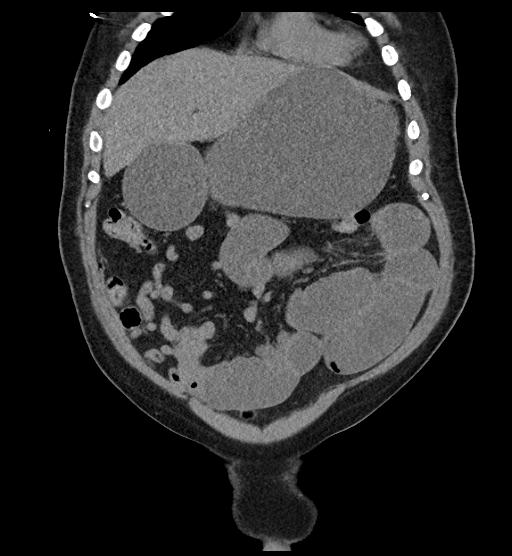
[im 46/104  soft-tissue]
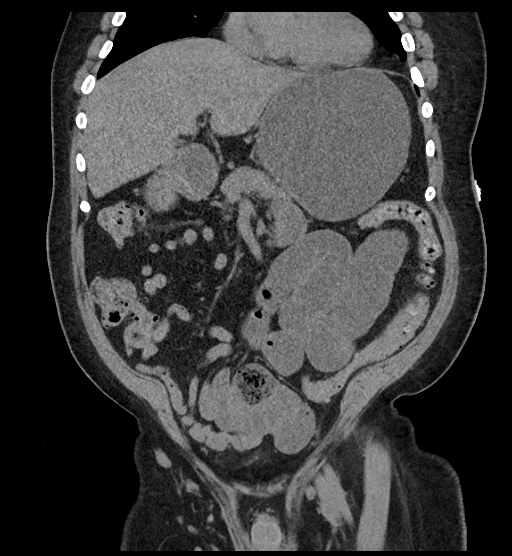
[im 58/104  soft-tissue]
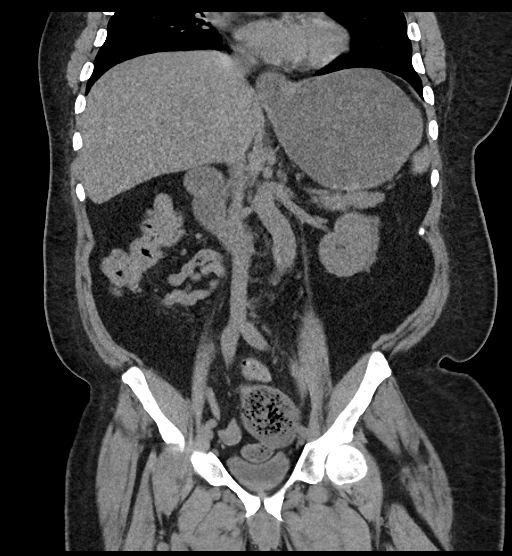

[16 of 46 positions shown; findings below may reference images not displayed]

FINDINGS: Lower chest: The distal esophagus is fluid-filled and dilated.
Minimal dependent atelectasis is seen. No pleural or pericardial
effusion. Heart size is normal.

Hepatobiliary: No focal liver abnormality is seen. No gallstones,
gallbladder wall thickening, or biliary dilatation.

Pancreas: Unremarkable. No pancreatic ductal dilatation or
surrounding inflammatory changes.

Spleen: Normal in size without focal abnormality.

Adrenals/Urinary Tract: Thickened left adrenal gland is unchanged.
Right adrenal gland appears normal. Kidneys, ureters and urinary
bladder are unremarkable.

Stomach/Bowel: There is marked distension of the stomach. Small
bowel loops are also dilated up to 4.8 cm with air-fluid levels
present. Fecalized small bowel contents are seen in the pelvis where
multiple loops appear adhesed together. No pneumatosis, portal
venous gas, free intraperitoneal air or fluid collection is
identified. Diverticulosis of the colon without diverticulitis is
noted. The appendix appears normal.

Vascular/Lymphatic: No significant vascular findings are present. No
enlarged abdominal or pelvic lymph nodes.

Reproductive: Prostate gland is mildly prominent.

Other: No ascites or hernia.

Musculoskeletal: Multilevel degenerative disc disease appears worst
at L3-4 and L4-5. No lytic or sclerotic lesion.
IMPRESSION: Examination is positive findings consistent with a small bowel
obstruction with a transition point in the pelvis. The obstruction
appears to be due to adhesions. No CT evidence of small bowel
ischemia.

Diverticulosis without diverticulitis.

## 2019-07-31 IMAGING — DX DG ABD PORTABLE 1V
1 series · 1 of 1 positions shown · non-contrast
Comparison: 10/19/2017.  CT 10/18/2017.

CLINICAL DATA: Small-bowel obstruction. History of hypertension,
acute renal injury, diabetes.

EXAM:
PORTABLE ABDOMEN - 1 VIEW

[abdomen kub]
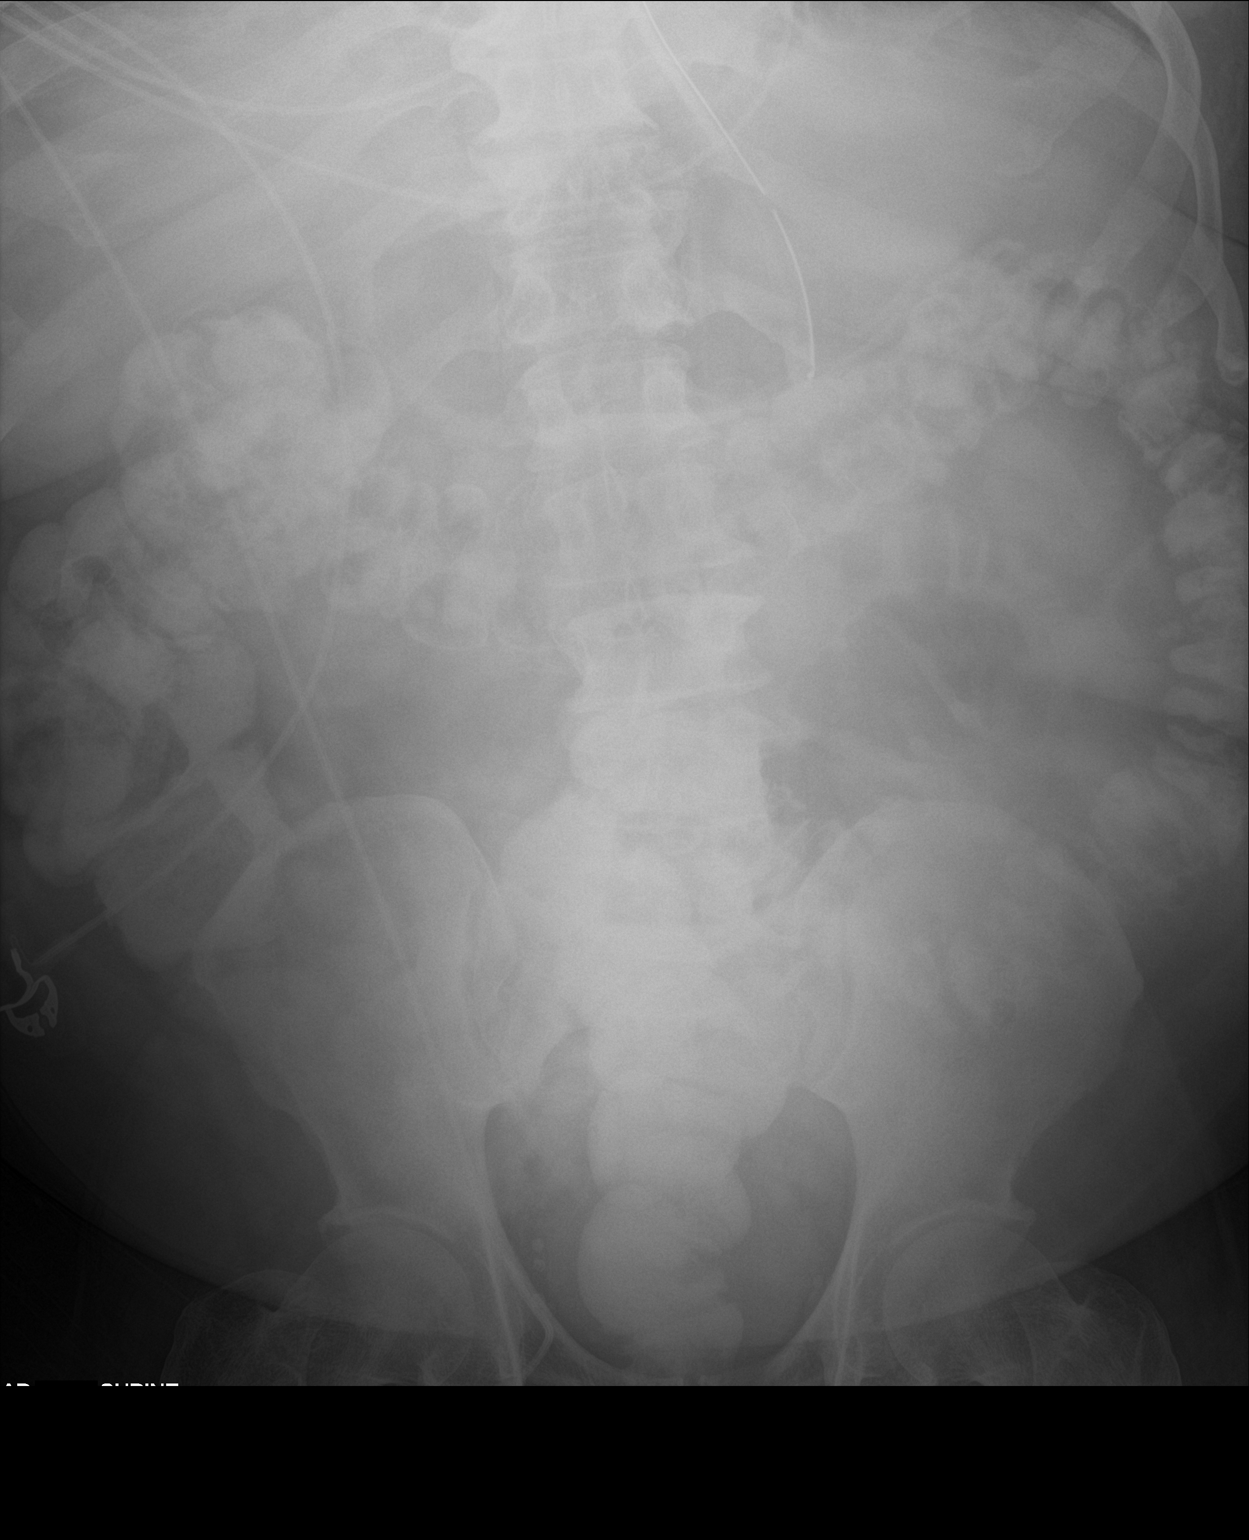

[1 of 1 positions shown; findings below may reference images not displayed]

FINDINGS: NG tube noted in the stomach. Persistent mild gastric distention.
Persistent small bowel distention cannot be excluded. Bowel
distention has improved from prior CT of 10/18/2017. Oral contrast
is however noted the colon. Findings suggest partial small bowel
obstruction with interim improvement. No free air identified .
Degenerative change thoracic spine scratched it degenerative changes
lumbar spine and both hips.
IMPRESSION: NG tube in the stomach. Persistent mild gastric distention.
Persistent small bowel distention cannot be excluded. Oral contrast
is however noted in the colon. Findings suggest partial small bowel
obstruction with interim improvement .

## 2020-08-13 ENCOUNTER — Inpatient Hospital Stay (HOSPITAL_COMMUNITY)
Admission: EM | Admit: 2020-08-13 | Discharge: 2020-08-26 | DRG: 329 | Disposition: A | Discharge status: Home Health | Payer: Medicare HMO | Attending: Internal Medicine | Admitting: Internal Medicine

## 2020-08-13 ENCOUNTER — Encounter (HOSPITAL_COMMUNITY): Payer: Self-pay | Admitting: Emergency Medicine

## 2020-08-13 ENCOUNTER — Emergency Department (HOSPITAL_COMMUNITY): Payer: Medicare HMO

## 2020-08-13 DIAGNOSIS — I10 Essential (primary) hypertension: Secondary | ICD-10-CM | POA: Diagnosis present

## 2020-08-13 DIAGNOSIS — Z7984 Long term (current) use of oral hypoglycemic drugs: Secondary | ICD-10-CM

## 2020-08-13 DIAGNOSIS — Z6831 Body mass index (BMI) 31.0-31.9, adult: Secondary | ICD-10-CM

## 2020-08-13 DIAGNOSIS — Z0189 Encounter for other specified special examinations: Secondary | ICD-10-CM

## 2020-08-13 DIAGNOSIS — E876 Hypokalemia: Secondary | ICD-10-CM | POA: Diagnosis present

## 2020-08-13 DIAGNOSIS — E669 Obesity, unspecified: Secondary | ICD-10-CM | POA: Diagnosis present

## 2020-08-13 DIAGNOSIS — E119 Type 2 diabetes mellitus without complications: Secondary | ICD-10-CM | POA: Diagnosis present

## 2020-08-13 DIAGNOSIS — K56609 Unspecified intestinal obstruction, unspecified as to partial versus complete obstruction: Secondary | ICD-10-CM | POA: Diagnosis not present

## 2020-08-13 DIAGNOSIS — Y92239 Unspecified place in hospital as the place of occurrence of the external cause: Secondary | ICD-10-CM | POA: Diagnosis present

## 2020-08-13 DIAGNOSIS — R400 Somnolence: Secondary | ICD-10-CM | POA: Diagnosis not present

## 2020-08-13 DIAGNOSIS — Z789 Other specified health status: Secondary | ICD-10-CM

## 2020-08-13 DIAGNOSIS — K56600 Partial intestinal obstruction, unspecified as to cause: Principal | ICD-10-CM | POA: Diagnosis present

## 2020-08-13 DIAGNOSIS — Z20822 Contact with and (suspected) exposure to covid-19: Secondary | ICD-10-CM | POA: Diagnosis present

## 2020-08-13 DIAGNOSIS — T433X5A Adverse effect of phenothiazine antipsychotics and neuroleptics, initial encounter: Secondary | ICD-10-CM | POA: Diagnosis not present

## 2020-08-13 DIAGNOSIS — E860 Dehydration: Secondary | ICD-10-CM | POA: Diagnosis present

## 2020-08-13 DIAGNOSIS — Z8249 Family history of ischemic heart disease and other diseases of the circulatory system: Secondary | ICD-10-CM

## 2020-08-13 DIAGNOSIS — Z87891 Personal history of nicotine dependence: Secondary | ICD-10-CM

## 2020-08-13 DIAGNOSIS — Z79899 Other long term (current) drug therapy: Secondary | ICD-10-CM

## 2020-08-13 DIAGNOSIS — E785 Hyperlipidemia, unspecified: Secondary | ICD-10-CM | POA: Diagnosis present

## 2020-08-13 DIAGNOSIS — N179 Acute kidney failure, unspecified: Secondary | ICD-10-CM | POA: Diagnosis present

## 2020-08-13 DIAGNOSIS — E118 Type 2 diabetes mellitus with unspecified complications: Secondary | ICD-10-CM | POA: Diagnosis present

## 2020-08-13 DIAGNOSIS — F1020 Alcohol dependence, uncomplicated: Secondary | ICD-10-CM | POA: Diagnosis present

## 2020-08-13 DIAGNOSIS — K660 Peritoneal adhesions (postprocedural) (postinfection): Secondary | ICD-10-CM | POA: Diagnosis present

## 2020-08-13 DIAGNOSIS — Z7982 Long term (current) use of aspirin: Secondary | ICD-10-CM

## 2020-08-13 DIAGNOSIS — N170 Acute kidney failure with tubular necrosis: Secondary | ICD-10-CM | POA: Diagnosis present

## 2020-08-13 DIAGNOSIS — Z888 Allergy status to other drugs, medicaments and biological substances status: Secondary | ICD-10-CM

## 2020-08-13 DIAGNOSIS — Z95828 Presence of other vascular implants and grafts: Secondary | ICD-10-CM

## 2020-08-13 DIAGNOSIS — K567 Ileus, unspecified: Secondary | ICD-10-CM | POA: Diagnosis not present

## 2020-08-13 DIAGNOSIS — Z8719 Personal history of other diseases of the digestive system: Secondary | ICD-10-CM

## 2020-08-13 LAB — COMPREHENSIVE METABOLIC PANEL
ALT: 46 U/L — ABNORMAL HIGH (ref 0–44)
AST: 32 U/L (ref 15–41)
Albumin: 4.3 g/dL (ref 3.5–5.0)
Alkaline Phosphatase: 63 U/L (ref 38–126)
Anion gap: 22 — ABNORMAL HIGH (ref 5–15)
BUN: 33 mg/dL — ABNORMAL HIGH (ref 8–23)
CO2: 25 mmol/L (ref 22–32)
Calcium: 11.1 mg/dL — ABNORMAL HIGH (ref 8.9–10.3)
Chloride: 90 mmol/L — ABNORMAL LOW (ref 98–111)
Creatinine, Ser: 2.3 mg/dL — ABNORMAL HIGH (ref 0.61–1.24)
GFR calc Af Amer: 33 mL/min — ABNORMAL LOW (ref >60)
GFR calc non Af Amer: 29 mL/min — ABNORMAL LOW (ref >60)
Glucose, Bld: 352 mg/dL — ABNORMAL HIGH (ref 70–99)
Potassium: 3.8 mmol/L (ref 3.5–5.1)
Sodium: 137 mmol/L (ref 135–145)
Total Bilirubin: 1.2 mg/dL (ref 0.3–1.2)
Total Protein: 8.5 g/dL — ABNORMAL HIGH (ref 6.5–8.1)

## 2020-08-13 LAB — CBC
HCT: 45.3 % (ref 39.0–52.0)
Hemoglobin: 15.3 g/dL (ref 13.0–17.0)
MCH: 29 pg (ref 26.0–34.0)
MCHC: 33.8 g/dL (ref 30.0–36.0)
MCV: 85.8 fL (ref 80.0–100.0)
Platelets: 314 10*3/uL (ref 150–400)
RBC: 5.28 MIL/uL (ref 4.22–5.81)
RDW: 13.2 % (ref 11.5–15.5)
WBC: 11 10*3/uL — ABNORMAL HIGH (ref 4.0–10.5)
nRBC: 0 % (ref 0.0–0.2)

## 2020-08-13 LAB — LIPASE, BLOOD: Lipase: 25 U/L (ref 11–51)

## 2020-08-13 MED ORDER — SODIUM CHLORIDE 0.9 % IV BOLUS
1000.0000 mL | Freq: Once | INTRAVENOUS | Status: AC
  Administered 2020-08-13: 1000 mL via INTRAVENOUS

## 2020-08-13 MED ORDER — SODIUM CHLORIDE 0.9 % IV SOLN: INTRAVENOUS | Status: DC

## 2020-08-13 MED ORDER — IOHEXOL 9 MG/ML PO SOLN
500.0000 mL | ORAL | Status: AC
  Administered 2020-08-13: 500 mL via ORAL

## 2020-08-13 NOTE — ED Provider Notes (Signed)
[facility] EMERGENCY DEPARTMENT Provider Note   CSN: [id number] Arrival date & time: 08/13/20  0913     History Chief Complaint  Patient presents with  . Abdominal Pain    Jeremiah Bowman is a 64 y.o. male.  HPI    Adult male with a history of prior bowel obstruction now presents with 3 days of abdominal pain, nausea, vomiting, no bowel movements. No fever, no chest pain.  No clear precipitant, and the patient was well prior to the onset of symptoms. Pain is diffuse, across the lower abdomen, is slightly improved now without clear intervention. Patient notes that his bowel obstruction was 2 years ago, he was admitted to the hospital, did not require surgery for resolution. No other abdominal surgery either.   Past Medical History:  Diagnosis Date  . Acute kidney injury ([place])   . Diabetes ([place])   . DKA (diabetic ketoacidoses) ([place])   . Hyperlipidemia   . Hypertension   . Small bowel obstruction ([place]) 10/2015    Patient Active Problem List   Diagnosis Date Noted  . Melena   . Acute esophagitis   . Uncontrolled type 2 diabetes mellitus with complication ([place])   . Hypokalemia   . DKA (diabetic ketoacidoses) ([place]) 05/13/2017  . Dehydration   . Diabetes mellitus with complication ([place])   . Partial small bowel obstruction ([place])   . SBO (small bowel obstruction) ([place])   . Abdominal pain 10/24/2015  . Nausea and vomiting 10/24/2015  . Small bowel obstruction ([place]) 10/24/2015  . Severe sepsis ([place]) 10/24/2015  . Alcohol dependency ([place]) 10/24/2015  . Diabetes mellitus ([place]) 10/24/2015  . HTN (hypertension) 10/24/2015  . Hyperlipidemia 10/24/2015  . Acute urinary retention 10/24/2015  . Acute renal failure (ARF) ([place]) 10/24/2015  . Sepsis ([place]) 10/24/2015  . Acute kidney injury ([place])   . Lactic acidosis     Past Surgical History:  Procedure Laterality Date  . ESOPHAGOGASTRODUODENOSCOPY N/A 10/20/2017   Procedure: ESOPHAGOGASTRODUODENOSCOPY (EGD);   Surgeon: Milus Banister, MD;  Location: [facility] ENDOSCOPY;  Service: Endoscopy;  Laterality: N/A;  . ESOPHAGOGASTRODUODENOSCOPY ENDOSCOPY    . GIVENS CAPSULE STUDY N/A 01/31/2018   Procedure: GIVENS CAPSULE STUDY;  Surgeon: Milus Banister, MD;  Location: [facility] ENDOSCOPY;  Service: Endoscopy;  Laterality: N/A;  . TONSILLECTOMY     patient denies  . WRIST SURGERY         Family History  Problem Relation Age of Onset  . Hypertension Mother   . Hypertension Father     Social History   Tobacco Use  . Smoking status: Former Research scientist (life sciences)  . Smokeless tobacco: Never Used  Vaping Use  . Vaping Use: Never used  Substance Use Topics  . Alcohol use: Yes    Alcohol/week: 16.0 standard drinks    Types: 2 Cans of beer, 14 Shots of liquor per week  . Drug use: No    Home Medications Prior to Admission medications   Medication Sig Start Date End Date Taking? Authorizing Provider  aspirin 81 MG chewable tablet Chew 81 mg by mouth daily.    [provider]  cyclobenzaprine (FLEXERIL) 5 MG tablet Take 5 mg by mouth 3 (three) times daily as needed for muscle spasms.    [provider]  metFORMIN (GLUCOPHAGE) 1000 MG tablet Take 1,000 mg by mouth 2 (two) times daily with a meal.    [provider]  NEEDLE, DISP, 30 G (BD DISP NEEDLES) 30G X 1/2" MISC 10 Units by  Does not apply route daily. 05/16/17   Allie Bossier, MD  oxybutynin (DITROPAN-XL) 5 MG 24 hr tablet Take 5 mg by mouth 2 (two) times daily.    [provider]  pantoprazole (PROTONIX) 40 MG tablet Take 1 tablet (40 mg total) by mouth 2 (two) times daily before a meal. 10/21/17   Colbert Ewing, MD  pravastatin (PRAVACHOL) 20 MG tablet Take 20 mg by mouth daily.    [provider]  ranitidine (ZANTAC) 75 MG tablet Take 75 mg by mouth as needed for heartburn.    [provider]  verapamil (CALAN-SR) 180 MG CR tablet Take 1 tablet by mouth daily.    [provider]    Allergies     Malarone [atovaquone-proguanil hcl]  Review of Systems   Review of Systems  Constitutional:       Per HPI, otherwise negative  HENT:       Per HPI, otherwise negative  Respiratory:       Per HPI, otherwise negative  Cardiovascular:       Per HPI, otherwise negative  Gastrointestinal: Positive for abdominal pain, nausea and vomiting.  Endocrine:       Negative aside from HPI  Genitourinary:       Neg aside from HPI   Musculoskeletal:       Per HPI, otherwise negative  Skin: Negative.   Neurological: Negative for syncope.    Physical Exam Updated Vital Signs BP 107/84   Pulse (!) 126   Temp 98.7 F (37.1 C) (Oral)   Resp 18   Ht 5\' 8"  (1.727 m)   Wt 93.9 kg   SpO2 92%   BMI 31.47 kg/m   Physical Exam Vitals and nursing note reviewed.  Constitutional:      General: He is not in acute distress.    Appearance: He is well-developed.  HENT:     Head: Normocephalic and atraumatic.  Eyes:     Conjunctiva/sclera: Conjunctivae normal.  Cardiovascular:     Rate and Rhythm: Regular rhythm. Tachycardia present.  Pulmonary:     Effort: Pulmonary effort is normal. No respiratory distress.     Breath sounds: No stridor.  Abdominal:     General: There is distension.     Tenderness: There is abdominal tenderness.  Skin:    General: Skin is warm and dry.  Neurological:     Mental Status: He is alert and oriented to person, place, and time.     ED Results / Procedures / Treatments   Labs (all labs ordered are listed, but only abnormal results are displayed) Labs Reviewed  COMPREHENSIVE METABOLIC PANEL - Abnormal; Notable for the following components:      Result Value   Chloride 90 (*)    Glucose, Bld 352 (*)    BUN 33 (*)    Creatinine, Ser 2.30 (*)    Calcium 11.1 (*)    Total Protein 8.5 (*)    ALT 46 (*)    GFR calc non Af Amer 29 (*)    GFR calc Af Amer 33 (*)    Anion gap 22 (*)    All other components within normal limits  CBC - Abnormal; Notable for  the following components:   WBC 11.0 (*)    All other components within normal limits  RESP PANEL BY RT PCR (RSV, FLU A&B, COVID)  LIPASE, BLOOD  URINALYSIS, ROUTINE W REFLEX MICROSCOPIC    EKG None  Radiology CT ABDOMEN PELVIS WO  CONTRAST  Result Date: 08/13/2020 CLINICAL DATA:  Constipation for 4 days. Suspected bowel obstruction. History of diabetes and acute kidney injury. EXAM: CT ABDOMEN AND PELVIS WITHOUT CONTRAST TECHNIQUE: Multidetector CT imaging of the abdomen and pelvis was performed following the standard protocol without IV contrast. COMPARISON:  10/20/2017 FINDINGS: Lower chest: Mild atelectasis in the lung bases. Distal esophagus is dilated with an air-fluid level suggesting reflux or dysmotility. Hepatobiliary: No focal liver abnormality is seen. No gallstones, gallbladder wall thickening, or biliary dilatation. Pancreas: Unremarkable. No pancreatic ductal dilatation or surrounding inflammatory changes. Spleen: Normal in size without focal abnormality. Adrenals/Urinary Tract: Adrenal glands are unremarkable. Kidneys are normal, without renal calculi, focal lesion, or hydronephrosis. Bladder is unremarkable. Stomach/Bowel: The stomach and jejunum are markedly distended with fluid. The distal small bowel are decompressed. Transition zone appears to be in the left lower quadrant. No obstructing mass is identified. Likely adhesions. Scattered stool in the colon without colonic distention or wall thickening. Colonic diverticula without evidence of diverticulitis. Appendix is normal. Vascular/Lymphatic: No significant vascular findings are present. No enlarged abdominal or pelvic lymph nodes. Reproductive: Prostate gland is enlarged. Other: No free air or free fluid in the abdomen. Abdominal wall musculature appears intact. Musculoskeletal: Degenerative changes in the spine. No destructive bone lesions. IMPRESSION: 1. Small bowel obstruction with transition zone in the left lower quadrant.  Likely adhesions. 2. Enlarged prostate gland. 3. Mild atelectasis in the lung bases. 4. Distal esophagus is dilated with an air-fluid level suggesting reflux. Electronically Signed   By: Lucienne Capers M.D.   On: 08/13/2020 23:37    Procedures Procedures (including critical care time)  Medications Ordered in ED Medications  iohexol (OMNIPAQUE) 9 MG/ML oral solution 500 mL (500 mLs Oral Contrast Given 08/13/20 2150)  0.9 %  sodium chloride infusion (has no administration in time range)  sodium chloride 0.9 % bolus 1,000 mL (0 mLs Intravenous Stopped 08/13/20 1736)    ED Course  I have reviewed the triage vital signs and the nursing notes.  Pertinent labs & imaging results that were available during my care of the patient were reviewed by me and considered in my medical decision making (see chart for details).    MDM Rules/Calculators/A&P                          11:41 PM Patient in similar condition, though he has had recurrence of his nausea, has vomited several times during his ED course. Now, CT scan is finally available, if the patient had a delay secondary to his renal dysfunction requiring only oral contrast CT scan reviewed, notable for bowel obstruction, transition zone, left lower quadrant. Patient has received antiemetics, fluids, will require admission, after consultation with her surgical colleagues for further monitoring, management of his bowel obstruction.  Final Clinical Impression(s) / ED Diagnoses Final diagnoses:  Small bowel obstruction ([place])  AKI (acute kidney injury) ([place])     Carmin Muskrat, MD 08/13/20 2343

## 2020-08-13 NOTE — ED Triage Notes (Signed)
Pt arrives via [facility] for c/o constipation x4 days, c/o dark, liquid emesis for the past 2 days, hx of bowel obstruction, CBG 363-is diabetic.

## 2020-08-13 NOTE — ED Notes (Signed)
This RN Requested [facility] PA to screen patient for further orders until pt can be roomed to provider

## 2020-08-13 NOTE — ED Notes (Signed)
Verbal order for CT abdomen pelvis with contrast given to this RN by PA Threasa Alpha.

## 2020-08-14 ENCOUNTER — Inpatient Hospital Stay (HOSPITAL_COMMUNITY): Payer: Medicare HMO

## 2020-08-14 DIAGNOSIS — I10 Essential (primary) hypertension: Secondary | ICD-10-CM | POA: Diagnosis not present

## 2020-08-14 DIAGNOSIS — Z20822 Contact with and (suspected) exposure to covid-19: Secondary | ICD-10-CM | POA: Diagnosis not present

## 2020-08-14 DIAGNOSIS — E119 Type 2 diabetes mellitus without complications: Secondary | ICD-10-CM | POA: Diagnosis not present

## 2020-08-14 DIAGNOSIS — K56609 Unspecified intestinal obstruction, unspecified as to partial versus complete obstruction: Secondary | ICD-10-CM | POA: Diagnosis present

## 2020-08-14 DIAGNOSIS — Z6831 Body mass index (BMI) 31.0-31.9, adult: Secondary | ICD-10-CM | POA: Diagnosis not present

## 2020-08-14 DIAGNOSIS — Z888 Allergy status to other drugs, medicaments and biological substances status: Secondary | ICD-10-CM | POA: Diagnosis not present

## 2020-08-14 DIAGNOSIS — R400 Somnolence: Secondary | ICD-10-CM | POA: Diagnosis not present

## 2020-08-14 DIAGNOSIS — T433X5A Adverse effect of phenothiazine antipsychotics and neuroleptics, initial encounter: Secondary | ICD-10-CM | POA: Diagnosis not present

## 2020-08-14 DIAGNOSIS — Z79899 Other long term (current) drug therapy: Secondary | ICD-10-CM | POA: Diagnosis not present

## 2020-08-14 DIAGNOSIS — E118 Type 2 diabetes mellitus with unspecified complications: Secondary | ICD-10-CM

## 2020-08-14 DIAGNOSIS — Z87891 Personal history of nicotine dependence: Secondary | ICD-10-CM | POA: Diagnosis not present

## 2020-08-14 DIAGNOSIS — F1020 Alcohol dependence, uncomplicated: Secondary | ICD-10-CM

## 2020-08-14 DIAGNOSIS — K56600 Partial intestinal obstruction, unspecified as to cause: Secondary | ICD-10-CM | POA: Diagnosis not present

## 2020-08-14 DIAGNOSIS — Z7982 Long term (current) use of aspirin: Secondary | ICD-10-CM | POA: Diagnosis not present

## 2020-08-14 DIAGNOSIS — N179 Acute kidney failure, unspecified: Secondary | ICD-10-CM

## 2020-08-14 DIAGNOSIS — Z8719 Personal history of other diseases of the digestive system: Secondary | ICD-10-CM | POA: Diagnosis not present

## 2020-08-14 DIAGNOSIS — Y92239 Unspecified place in hospital as the place of occurrence of the external cause: Secondary | ICD-10-CM | POA: Diagnosis present

## 2020-08-14 DIAGNOSIS — Z7984 Long term (current) use of oral hypoglycemic drugs: Secondary | ICD-10-CM | POA: Diagnosis not present

## 2020-08-14 DIAGNOSIS — E669 Obesity, unspecified: Secondary | ICD-10-CM | POA: Diagnosis present

## 2020-08-14 DIAGNOSIS — Z8249 Family history of ischemic heart disease and other diseases of the circulatory system: Secondary | ICD-10-CM | POA: Diagnosis not present

## 2020-08-14 DIAGNOSIS — E785 Hyperlipidemia, unspecified: Secondary | ICD-10-CM | POA: Diagnosis not present

## 2020-08-14 DIAGNOSIS — K660 Peritoneal adhesions (postprocedural) (postinfection): Secondary | ICD-10-CM | POA: Diagnosis present

## 2020-08-14 DIAGNOSIS — E860 Dehydration: Secondary | ICD-10-CM | POA: Diagnosis not present

## 2020-08-14 DIAGNOSIS — E876 Hypokalemia: Secondary | ICD-10-CM | POA: Diagnosis not present

## 2020-08-14 DIAGNOSIS — K567 Ileus, unspecified: Secondary | ICD-10-CM | POA: Diagnosis not present

## 2020-08-14 DIAGNOSIS — N170 Acute kidney failure with tubular necrosis: Secondary | ICD-10-CM | POA: Diagnosis not present

## 2020-08-14 LAB — BASIC METABOLIC PANEL
Anion gap: 20 — ABNORMAL HIGH (ref 5–15)
BUN: 52 mg/dL — ABNORMAL HIGH (ref 8–23)
CO2: 36 mmol/L — ABNORMAL HIGH (ref 22–32)
Calcium: 10.8 mg/dL — ABNORMAL HIGH (ref 8.9–10.3)
Chloride: 85 mmol/L — ABNORMAL LOW (ref 98–111)
Creatinine, Ser: 4.59 mg/dL — ABNORMAL HIGH (ref 0.61–1.24)
GFR calc Af Amer: 14 mL/min — ABNORMAL LOW (ref >60)
GFR calc non Af Amer: 12 mL/min — ABNORMAL LOW (ref >60)
Glucose, Bld: 198 mg/dL — ABNORMAL HIGH (ref 70–99)
Potassium: 3.1 mmol/L — ABNORMAL LOW (ref 3.5–5.1)
Sodium: 141 mmol/L (ref 135–145)

## 2020-08-14 LAB — RESP PANEL BY RT PCR (RSV, FLU A&B, COVID)
Influenza A by PCR: NEGATIVE
Influenza B by PCR: NEGATIVE
Respiratory Syncytial Virus by PCR: NEGATIVE
SARS Coronavirus 2 by RT PCR: NEGATIVE

## 2020-08-14 LAB — CBG MONITORING, ED: Glucose-Capillary: 314 mg/dL — ABNORMAL HIGH (ref 70–99)

## 2020-08-14 LAB — CBC
HCT: 41.1 % (ref 39.0–52.0)
Hemoglobin: 14 g/dL (ref 13.0–17.0)
MCH: 28.5 pg (ref 26.0–34.0)
MCHC: 34.1 g/dL (ref 30.0–36.0)
MCV: 83.5 fL (ref 80.0–100.0)
Platelets: 316 10*3/uL (ref 150–400)
RBC: 4.92 MIL/uL (ref 4.22–5.81)
RDW: 13.3 % (ref 11.5–15.5)
WBC: 10.7 10*3/uL — ABNORMAL HIGH (ref 4.0–10.5)
nRBC: 0 % (ref 0.0–0.2)

## 2020-08-14 LAB — HIV ANTIBODY (ROUTINE TESTING W REFLEX): HIV Screen 4th Generation wRfx: NONREACTIVE

## 2020-08-14 LAB — HEMOGLOBIN A1C
Hgb A1c MFr Bld: 7 % — ABNORMAL HIGH (ref 4.8–5.6)
Mean Plasma Glucose: 154.2 mg/dL

## 2020-08-14 LAB — GLUCOSE, CAPILLARY
Glucose-Capillary: 232 mg/dL — ABNORMAL HIGH (ref 70–99)
Glucose-Capillary: 187 mg/dL — ABNORMAL HIGH (ref 70–99)
Glucose-Capillary: 157 mg/dL — ABNORMAL HIGH (ref 70–99)
Glucose-Capillary: 138 mg/dL — ABNORMAL HIGH (ref 70–99)

## 2020-08-14 LAB — PHOSPHORUS: Phosphorus: 4.5 mg/dL (ref 2.5–4.6)

## 2020-08-14 LAB — MAGNESIUM: Magnesium: 1.6 mg/dL — ABNORMAL LOW (ref 1.7–2.4)

## 2020-08-14 MED ORDER — DARIFENACIN HYDROBROMIDE ER 15 MG PO TB24
15.0000 mg | ORAL_TABLET | Freq: Every day | ORAL | Status: DC
  Administered 2020-08-15 – 2020-08-26 (×11): 15 mg via ORAL
  Filled 2020-08-14 (×13): qty 1

## 2020-08-14 MED ORDER — THIAMINE HCL 100 MG/ML IJ SOLN
100.0000 mg | Freq: Every day | INTRAMUSCULAR | Status: DC
  Administered 2020-08-14 – 2020-08-22 (×3): 100 mg via INTRAVENOUS
  Filled 2020-08-14 (×3): qty 2

## 2020-08-14 MED ORDER — LORAZEPAM 1 MG PO TABS: 1.0000 mg | ORAL_TABLET | ORAL | Status: AC | PRN

## 2020-08-14 MED ORDER — HEPARIN SODIUM (PORCINE) 5000 UNIT/ML IJ SOLN
5000.0000 [IU] | Freq: Three times a day (TID) | INTRAMUSCULAR | Status: DC
  Administered 2020-08-14 – 2020-08-26 (×36): 5000 [IU] via SUBCUTANEOUS
  Filled 2020-08-14 (×36): qty 1

## 2020-08-14 MED ORDER — SODIUM CHLORIDE 0.9 % IV BOLUS
1000.0000 mL | Freq: Once | INTRAVENOUS | Status: AC
  Administered 2020-08-14: 1000 mL via INTRAVENOUS

## 2020-08-14 MED ORDER — PANTOPRAZOLE SODIUM 40 MG PO TBEC: 40.0000 mg | DELAYED_RELEASE_TABLET | Freq: Two times a day (BID) | ORAL | Status: DC

## 2020-08-14 MED ORDER — PHENOL 1.4 % MT LIQD
1.0000 | OROMUCOSAL | Status: DC | PRN
  Administered 2020-08-15: 1 via OROMUCOSAL
  Filled 2020-08-14: qty 177

## 2020-08-14 MED ORDER — POTASSIUM CHLORIDE 10 MEQ/100ML IV SOLN
10.0000 meq | INTRAVENOUS | Status: AC
  Administered 2020-08-14 (×2): 10 meq via INTRAVENOUS
  Filled 2020-08-14 (×2): qty 100

## 2020-08-14 MED ORDER — VERAPAMIL HCL ER 180 MG PO TBCR
180.0000 mg | EXTENDED_RELEASE_TABLET | Freq: Every day | ORAL | Status: DC
  Administered 2020-08-15 – 2020-08-26 (×11): 180 mg via ORAL
  Filled 2020-08-14 (×13): qty 1

## 2020-08-14 MED ORDER — LORAZEPAM 2 MG/ML IJ SOLN: 1.0000 mg | INTRAMUSCULAR | Status: AC | PRN

## 2020-08-14 MED ORDER — ADULT MULTIVITAMIN W/MINERALS CH
1.0000 | ORAL_TABLET | Freq: Every day | ORAL | Status: DC
  Administered 2020-08-15 – 2020-08-26 (×11): 1 via ORAL
  Filled 2020-08-14 (×11): qty 1

## 2020-08-14 MED ORDER — ONDANSETRON HCL 4 MG/2ML IJ SOLN
4.0000 mg | Freq: Four times a day (QID) | INTRAMUSCULAR | Status: DC | PRN
  Administered 2020-08-21 – 2020-08-23 (×2): 4 mg via INTRAVENOUS
  Filled 2020-08-14 (×2): qty 2

## 2020-08-14 MED ORDER — PANTOPRAZOLE SODIUM 40 MG IV SOLR
40.0000 mg | Freq: Two times a day (BID) | INTRAVENOUS | Status: DC
  Administered 2020-08-14 – 2020-08-26 (×24): 40 mg via INTRAVENOUS
  Filled 2020-08-14 (×24): qty 40

## 2020-08-14 MED ORDER — THIAMINE HCL 100 MG PO TABS
100.0000 mg | ORAL_TABLET | Freq: Every day | ORAL | Status: DC
  Administered 2020-08-15 – 2020-08-26 (×9): 100 mg via ORAL
  Filled 2020-08-14 (×9): qty 1

## 2020-08-14 MED ORDER — SODIUM CHLORIDE 0.9 % IV SOLN
25.0000 mg | Freq: Three times a day (TID) | INTRAVENOUS | Status: DC | PRN
  Administered 2020-08-16: 25 mg via INTRAVENOUS
  Filled 2020-08-14 (×3): qty 1

## 2020-08-14 MED ORDER — FOLIC ACID 1 MG PO TABS
1.0000 mg | ORAL_TABLET | Freq: Every day | ORAL | Status: DC
  Administered 2020-08-15 – 2020-08-26 (×11): 1 mg via ORAL
  Filled 2020-08-14 (×11): qty 1

## 2020-08-14 MED ORDER — DIATRIZOATE MEGLUMINE & SODIUM 66-10 % PO SOLN
90.0000 mL | Freq: Once | ORAL | Status: AC
  Administered 2020-08-14: 90 mL via NASOGASTRIC
  Filled 2020-08-14: qty 90

## 2020-08-14 MED ORDER — INSULIN ASPART 100 UNIT/ML ~~LOC~~ SOLN
0.0000 [IU] | SUBCUTANEOUS | Status: DC
  Administered 2020-08-14: 1 [IU] via SUBCUTANEOUS
  Administered 2020-08-14: 3 [IU] via SUBCUTANEOUS
  Administered 2020-08-14 (×3): 2 [IU] via SUBCUTANEOUS
  Administered 2020-08-14: 7 [IU] via SUBCUTANEOUS
  Administered 2020-08-15 (×2): 1 [IU] via SUBCUTANEOUS
  Administered 2020-08-15: 2 [IU] via SUBCUTANEOUS
  Administered 2020-08-15 (×2): 1 [IU] via SUBCUTANEOUS
  Administered 2020-08-15: 2 [IU] via SUBCUTANEOUS
  Administered 2020-08-16 (×3): 1 [IU] via SUBCUTANEOUS
  Administered 2020-08-16: 2 [IU] via SUBCUTANEOUS
  Administered 2020-08-17 (×2): 1 [IU] via SUBCUTANEOUS
  Administered 2020-08-17: 2 [IU] via SUBCUTANEOUS
  Administered 2020-08-18 – 2020-08-22 (×2): 1 [IU] via SUBCUTANEOUS
  Administered 2020-08-22: 2 [IU] via SUBCUTANEOUS
  Administered 2020-08-22 – 2020-08-23 (×5): 1 [IU] via SUBCUTANEOUS
  Administered 2020-08-23: 2 [IU] via SUBCUTANEOUS
  Administered 2020-08-23: 1 [IU] via SUBCUTANEOUS

## 2020-08-14 MED ORDER — ONDANSETRON HCL 4 MG PO TABS: 4.0000 mg | ORAL_TABLET | Freq: Four times a day (QID) | ORAL | Status: DC | PRN

## 2020-08-14 MED ORDER — PRAVASTATIN SODIUM 10 MG PO TABS
20.0000 mg | ORAL_TABLET | Freq: Every day | ORAL | Status: DC
  Administered 2020-08-15 – 2020-08-25 (×10): 20 mg via ORAL
  Filled 2020-08-14 (×12): qty 2

## 2020-08-14 NOTE — Consult Note (Signed)
Jeremiah Bowman 1956-06-01  [id number].    Requesting MD: Dr. Roger Shelter Chief Complaint/Reason for Consult: SBO  HPI: Jeremiah Bowman is a 64 y.o. w/ a hx of HTN, HLD, DM2 and prior SBO who presented with abdominal pain, n/v.  Patient reports 4 days ago he began having diffuse, crampy abdominal pain that was worse in the lower abdomen.  Reports associated nausea that is constant as well as multiple episodes of emesis. No BM or flatus for 3 days. His pain has gradually worsened since onset. He feels symptoms are similar to when he had an SBO in the past.  Patient has been seen by our service for multiple SBO's in the past are all managed nonoperatively (10/24/2015, 05/13/2017, and 10/18/2017).  He had a EGD 10/20/17 that showed severe ulcerative esophagitis but was otherwise unremarkable. He underwent capsule endoscopy 01/31/18 that was negative. He underwent colonoscopy 03/01/2018 by Dr. Ardis Hughs that showed two polyps but was otherwise negative. He has never had any abdominal surgeries.    Today his CT A/P showed small bowel obstruction with transition zone in the left lower quadrant. He was also found to have AKI and electrolyte abnormalities likely 2/2 dehydration. [facility] admitted. We were asked to see.   ROS: Review of Systems  Constitutional: Negative for chills and fever.  Respiratory: Negative for cough and shortness of breath.   Cardiovascular: Negative for chest pain and leg swelling.  Gastrointestinal: Positive for abdominal pain, constipation, nausea and vomiting. Negative for diarrhea.  Genitourinary: Negative for dysuria.  Musculoskeletal: Negative for back pain.  All other systems reviewed and are negative.   Family History  Problem Relation Age of Onset  . Hypertension Mother   . Hypertension Father     Past Medical History:  Diagnosis Date  . Acute kidney injury ([place])   . Diabetes ([place])   . DKA (diabetic ketoacidoses) ([place])   . Hyperlipidemia   . Hypertension   . Small bowel  obstruction ([place]) 10/2015    Past Surgical History:  Procedure Laterality Date  . ESOPHAGOGASTRODUODENOSCOPY N/A 10/20/2017   Procedure: ESOPHAGOGASTRODUODENOSCOPY (EGD);  Surgeon: Milus Banister, MD;  Location: [facility] ENDOSCOPY;  Service: Endoscopy;  Laterality: N/A;  . ESOPHAGOGASTRODUODENOSCOPY ENDOSCOPY    . GIVENS CAPSULE STUDY N/A 01/31/2018   Procedure: GIVENS CAPSULE STUDY;  Surgeon: Milus Banister, MD;  Location: [facility] ENDOSCOPY;  Service: Endoscopy;  Laterality: N/A;  . TONSILLECTOMY     patient denies  . WRIST SURGERY      Social History:  reports that he has quit smoking. He has never used smokeless tobacco. He reports current alcohol use of about 16.0 standard drinks of alcohol per week. He reports that he does not use drugs.  Allergies:  Allergies  Allergen Reactions  . Malarone [Atovaquone-Proguanil Hcl] Itching    Facility-Administered Medications Prior to Admission  Medication Dose Route Frequency Provider Last Rate Last Admin  . 0.9 %  sodium chloride infusion  500 mL Intravenous Once Milus Banister, MD       Medications Prior to Admission  Medication Sig Dispense Refill  . aspirin 81 MG chewable tablet Chew 81 mg by mouth daily.    Marland Kitchen ibuprofen (ADVIL) 800 MG tablet Take 800 mg by mouth in the morning and at bedtime.    Marland Kitchen lisinopril-hydrochlorothiazide (ZESTORETIC) 20-12.5 MG tablet Take 1 tablet by mouth 2 (two) times daily.    . metFORMIN (GLUCOPHAGE) 1000 MG tablet Take 1,000 mg by mouth 2 (two) times daily with a  meal.    . pantoprazole (PROTONIX) 40 MG tablet Take 1 tablet (40 mg total) by mouth 2 (two) times daily before a meal. 60 tablet 0  . pravastatin (PRAVACHOL) 20 MG tablet Take 20 mg by mouth daily.    . solifenacin (VESICARE) 10 MG tablet Take 10 mg by mouth daily.    . verapamil (CALAN-SR) 180 MG CR tablet Take 1 tablet by mouth daily.    . cyclobenzaprine (FLEXERIL) 5 MG tablet Take 5 mg by mouth 3 (three) times daily as needed for muscle spasms.     Marland Kitchen NEEDLE, DISP, 30 G (BD DISP NEEDLES) 30G X 1/2" MISC 10 Units by Does not apply route daily. 200 each 0     Physical Exam: Blood pressure 111/71, pulse (!) 109, temperature 98.9 F (37.2 C), temperature source Oral, resp. rate 17, height 5\' 8"  (1.727 m), weight 93.9 kg, SpO2 94 %. General: pleasant, WD/WN male who is sitting up in NAD HEENT: head is normocephalic, atraumatic.  Sclera are noninjected.  PERRL.  Ears and nose without any masses or lesions.  Mouth is pink and moist. Dentition fair. NGT present with 900 cc thick, bilious output in cannister Heart: regular, rate, and rhythm.  Normal s1,s2. No obvious murmurs, gallops, or rubs noted.  Palpable pedal pulses bilaterally  Lungs: CTAB, no wheezes, rhonchi, or rales noted.  Respiratory effort nonlabored Abd: Soft, moderate distension, overall nontender, +BS, no masses, hernias, or organomegaly. No prior abdominal scars.  MS: no BUE/BLE edema, calves soft and nontender Skin: warm and dry with no masses, lesions, or rashes Psych: A&Ox4 with an appropriate affect Neuro: cranial nerves grossly intact, equal strength in BUE/BLE bilaterally, normal speech, though process intact  Results for orders placed or performed during the hospital encounter of 08/13/20 (from the past 48 hour(s))  Lipase, blood     Status: None   Collection Time: 08/13/20  9:58 AM  Result Value Ref Range   Lipase 25 11 - 51 U/L    Comment: Performed at [facility], [place] [address], [place], [place] [address]  Comprehensive metabolic panel     Status: Abnormal   Collection Time: 08/13/20  9:58 AM  Result Value Ref Range   Sodium 137 135 - 145 mmol/L   Potassium 3.8 3.5 - 5.1 mmol/L   Chloride 90 (L) 98 - 111 mmol/L   CO2 25 22 - 32 mmol/L   Glucose, Bld 352 (H) 70 - 99 mg/dL    Comment: Glucose reference range applies only to samples taken after fasting for at least 8 hours.   BUN 33 (H) 8 - 23 mg/dL   Creatinine, Ser 2.30 (H) 0.61 - 1.24 mg/dL    Calcium 11.1 (H) 8.9 - 10.3 mg/dL   Total Protein 8.5 (H) 6.5 - 8.1 g/dL   Albumin 4.3 3.5 - 5.0 g/dL   AST 32 15 - 41 U/L   ALT 46 (H) 0 - 44 U/L   Alkaline Phosphatase 63 38 - 126 U/L   Total Bilirubin 1.2 0.3 - 1.2 mg/dL   GFR calc non Af Amer 29 (L) >60 mL/min   GFR calc Af Amer 33 (L) >60 mL/min   Anion gap 22 (H) 5 - 15    Comment: Performed at [facility] [address] [place], [place] [address]  CBC     Status: Abnormal   Collection Time: 08/13/20  9:58 AM  Result Value Ref Range   WBC 11.0 (H) 4.0 -  10.5 K/uL   RBC 5.28 4.22 - 5.81 MIL/uL   Hemoglobin 15.3 13.0 - 17.0 g/dL   HCT 45.3 39 - 52 %   MCV 85.8 80.0 - 100.0 fL   MCH 29.0 26.0 - 34.0 pg   MCHC 33.8 30.0 - 36.0 g/dL   RDW 13.2 11.5 - 15.5 %   Platelets 314 150 - 400 K/uL   nRBC 0.0 0.0 - 0.2 %    Comment: Performed at [facility], [place] [address] [place], [place] [address]  Resp Panel by RT PCR (RSV, Flu A&B, Covid) - Nasopharyngeal Swab     Status: None   Collection Time: 08/14/20 12:55 AM   Specimen: Nasopharyngeal Swab  Result Value Ref Range   SARS Coronavirus 2 by RT PCR NEGATIVE NEGATIVE    Comment: (NOTE) SARS-CoV-2 target nucleic acids are NOT DETECTED.  The SARS-CoV-2 RNA is generally detectable in upper respiratoy specimens during the acute phase of infection. The lowest concentration of SARS-CoV-2 viral copies this assay can detect is 131 copies/mL. A negative result does not preclude SARS-Cov-2 infection and should not be used as the sole basis for treatment or other patient management decisions. A negative result may occur with  improper specimen collection/handling, submission of specimen other than nasopharyngeal swab, presence of viral mutation(s) within the areas targeted by this assay, and inadequate number of viral copies (<131 copies/mL). A negative result must be combined with clinical observations, patient history, and epidemiological information. The expected result  is Negative.  Fact Sheet for Patients:  PinkCheek.be  Fact Sheet for Healthcare Providers:  GravelBags.it  This test is no t yet approved or cleared by the [place] FDA and  has been authorized for detection and/or diagnosis of SARS-CoV-2 by FDA under an Emergency Use Authorization (EUA). This EUA will remain  in effect (meaning this test can be used) for the duration of the COVID-19 declaration under Section 564(b)(1) of the Act, 21 U.S.C. section 360bbb-3(b)(1), unless the authorization is terminated or revoked sooner.     Influenza A by PCR NEGATIVE NEGATIVE   Influenza B by PCR NEGATIVE NEGATIVE    Comment: (NOTE) The Xpert Xpress SARS-CoV-2/FLU/RSV assay is intended as an aid in  the diagnosis of influenza from Nasopharyngeal swab specimens and  should not be used as a sole basis for treatment. Nasal washings and  aspirates are unacceptable for Xpert Xpress SARS-CoV-2/FLU/RSV  testing.  Fact Sheet for Patients: PinkCheek.be  Fact Sheet for Healthcare Providers: GravelBags.it  This test is not yet approved or cleared by the [place] FDA and  has been authorized for detection and/or diagnosis of SARS-CoV-2 by  FDA under an Emergency Use Authorization (EUA). This EUA will remain  in effect (meaning this test can be used) for the duration of the  Covid-19 declaration under Section 564(b)(1) of the Act, 21  U.S.C. section 360bbb-3(b)(1), unless the authorization is  terminated or revoked.    Respiratory Syncytial Virus by PCR NEGATIVE NEGATIVE    Comment: (NOTE) Fact Sheet for Patients: PinkCheek.be  Fact Sheet for Healthcare Providers: GravelBags.it  This test is not yet approved or cleared by the [place] FDA and  has been authorized for detection and/or diagnosis of SARS-CoV-2 by   FDA under an Emergency Use Authorization (EUA). This EUA will remain  in effect (meaning this test can be used) for the duration of the  COVID-19 declaration under Section 564(b)(1) of the Act, 21 U.S.C.  section 360bbb-3(b)(1), unless the authorization is  terminated or  revoked. Performed at [facility], [place] [address], [place], [place] [address]   CBG monitoring, ED     Status: Abnormal   Collection Time: 08/14/20  1:03 AM  Result Value Ref Range   Glucose-Capillary 314 (H) 70 - 99 mg/dL    Comment: Glucose reference range applies only to samples taken after fasting for at least 8 hours.   Comment 1 Document in Chart   Glucose, capillary     Status: Abnormal   Collection Time: 08/14/20  4:05 AM  Result Value Ref Range   Glucose-Capillary 232 (H) 70 - 99 mg/dL    Comment: Glucose reference range applies only to samples taken after fasting for at least 8 hours.  Hemoglobin A1c     Status: Abnormal   Collection Time: 08/14/20  5:05 AM  Result Value Ref Range   Hgb A1c MFr Bld 7.0 (H) 4.8 - 5.6 %    Comment: (NOTE) Pre diabetes:          5.7%-6.4%  Diabetes:              >6.4%  Glycemic control for   <7.0% adults with diabetes    Mean Plasma Glucose 154.2 mg/dL    Comment: Performed at [facility] [address] [place], [place] [address]  HIV Antibody (routine testing w rflx)     Status: None   Collection Time: 08/14/20  5:05 AM  Result Value Ref Range   HIV Screen 4th Generation wRfx Non Reactive Non Reactive    Comment: Performed at [facility], [place] [address] [place], [place] [address]  Basic metabolic panel     Status: Abnormal   Collection Time: 08/14/20  5:05 AM  Result Value Ref Range   Sodium 141 135 - 145 mmol/L   Potassium 3.1 (L) 3.5 - 5.1 mmol/L   Chloride 85 (L) 98 - 111 mmol/L   CO2 36 (H) 22 - 32 mmol/L   Glucose, Bld 198 (H) 70 - 99 mg/dL    Comment: Glucose reference range applies only to samples taken after fasting for at least  8 hours.   BUN 52 (H) 8 - 23 mg/dL   Creatinine, Ser 4.59 (H) 0.61 - 1.24 mg/dL    Comment: DELTA CHECK NOTED   Calcium 10.8 (H) 8.9 - 10.3 mg/dL   GFR calc non Af Amer 12 (L) >60 mL/min   GFR calc Af Amer 14 (L) >60 mL/min   Anion gap 20 (H) 5 - 15    Comment: Performed at [facility] [address] [place], [place] [address]  CBC     Status: Abnormal   Collection Time: 08/14/20  5:05 AM  Result Value Ref Range   WBC 10.7 (H) 4.0 - 10.5 K/uL   RBC 4.92 4.22 - 5.81 MIL/uL   Hemoglobin 14.0 13.0 - 17.0 g/dL   HCT 41.1 39 - 52 %   MCV 83.5 80.0 - 100.0 fL   MCH 28.5 26.0 - 34.0 pg   MCHC 34.1 30.0 - 36.0 g/dL   RDW 13.3 11.5 - 15.5 %   Platelets 316 150 - 400 K/uL   nRBC 0.0 0.0 - 0.2 %    Comment: Performed at [facility], [place] [address], [place], [place] [address]  Magnesium     Status: Abnormal   Collection Time: 08/14/20  5:05 AM  Result Value Ref Range   Magnesium 1.6 (L) 1.7 -  2.4 mg/dL    Comment: Performed at [facility], [place] [address], [place], [place] [address]  Phosphorus     Status: None   Collection Time: 08/14/20  5:05 AM  Result Value Ref Range   Phosphorus 4.5 2.5 - 4.6 mg/dL    Comment: Performed at [facility] [address] [place], [place] [address]  Glucose, capillary     Status: Abnormal   Collection Time: 08/14/20  7:50 AM  Result Value Ref Range   Glucose-Capillary 187 (H) 70 - 99 mg/dL    Comment: Glucose reference range applies only to samples taken after fasting for at least 8 hours.   CT ABDOMEN PELVIS WO CONTRAST  Result Date: 08/13/2020 CLINICAL DATA:  Constipation for 4 days. Suspected bowel obstruction. History of diabetes and acute kidney injury. EXAM: CT ABDOMEN AND PELVIS WITHOUT CONTRAST TECHNIQUE: Multidetector CT imaging of the abdomen and pelvis was performed following the standard protocol without IV contrast. COMPARISON:  10/20/2017 FINDINGS: Lower chest: Mild atelectasis in the lung bases. Distal  esophagus is dilated with an air-fluid level suggesting reflux or dysmotility. Hepatobiliary: No focal liver abnormality is seen. No gallstones, gallbladder wall thickening, or biliary dilatation. Pancreas: Unremarkable. No pancreatic ductal dilatation or surrounding inflammatory changes. Spleen: Normal in size without focal abnormality. Adrenals/Urinary Tract: Adrenal glands are unremarkable. Kidneys are normal, without renal calculi, focal lesion, or hydronephrosis. Bladder is unremarkable. Stomach/Bowel: The stomach and jejunum are markedly distended with fluid. The distal small bowel are decompressed. Transition zone appears to be in the left lower quadrant. No obstructing mass is identified. Likely adhesions. Scattered stool in the colon without colonic distention or wall thickening. Colonic diverticula without evidence of diverticulitis. Appendix is normal. Vascular/Lymphatic: No significant vascular findings are present. No enlarged abdominal or pelvic lymph nodes. Reproductive: Prostate gland is enlarged. Other: No free air or free fluid in the abdomen. Abdominal wall musculature appears intact. Musculoskeletal: Degenerative changes in the spine. No destructive bone lesions. IMPRESSION: 1. Small bowel obstruction with transition zone in the left lower quadrant. Likely adhesions. 2. Enlarged prostate gland. 3. Mild atelectasis in the lung bases. 4. Distal esophagus is dilated with an air-fluid level suggesting reflux. Electronically Signed   By: Lucienne Capers M.D.   On: 08/13/2020 23:37   Anti-infectives (From admission, onward)   None      Assessment/Plan HTN HLD DM2 AKI  - Per [facility] -   SBO - No indication for emergency surgery - Continue NGT to [facility]WS - Will order SBO protocol - Correct electrolytes. 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 He has a virgin abdomen with no prior abdominal surgeries. He underwent capsule endoscopy 01/31/18 that did not show cause of SBO's. He underwent colonoscopy 03/01/2018 by Dr. Ardis Hughs that showed two polyps but was otherwise negative. If patient does not improve, he may need a diagnostic laparoscopy this admission.   FEN - NPO, NGT, IVF VTE - SCDs, Heparin ID - None   Obie Dredge, [facility] Surgery 08/14/2020, 11:06 AM Please see Amion for pager number during day hours 7:00am-4:30pm

## 2020-08-14 NOTE — Progress Notes (Signed)
PROGRESS NOTE    Patient: Jeremiah Bowman                            PCP: Everardo Beals, NP                    DOB: [birth year]            DOA: 08/13/2020 OEV:[id number]             DOS: 08/14/2020, 10:24 AM   LOS: 0 days   Date of Service: The patient was seen and examined on 08/14/2020  Subjective:   The patient was seen and examined this Am. Patient remained stable hemodynamically, NG tube in place, moderate output Complaining of pickups Improved nausea vomiting Abdominal discomfort denying him pending gas or bowel movements  Brief Narrative:  Jeremiah Bowman is a 64 y.o. male with medical history significant of DM2, HTN, prior SBOs of unclear cause.  No h/o abd surgery, had colonoscopy in 2019 due to recurrent SBOs but nothing found other than 2 tubular adenomas. Last SBO in 2019. Complained of abdominal pain, nausea vomiting, no BM, symptoms x3 days CT abdomen pelvis confirmed SBO.  Assessment & Plan:   Principal Problem:   SBO (small bowel obstruction) (HCC) Active Problems:   Alcohol dependency ([place])   HTN (hypertension)   Acute kidney injury ([place])   Diabetes mellitus with complication (HCC)  Acute small bowel obstruction -n.p.o.,  -We will continue IV fluid, NG tube in place-with copious moderate output -No bowel movements or gas yet -General surgery consult on admission, no follow-up note yet -Protonix IV twice daily  Acute renal insufficiency -Creatinine: 2.3 >> 4.59 -Likely prerenal/ATN secondary to SBO, dehydration -Continue supportive therapy, IV fluids -Avoiding nephrotoxins Hold zestoretic  HTN - 1. Cont verapimil 2. Hold zestoretic  DM2 - 1. Hold metformin 2. SSI sensitive Q4H, CBG, SSI coverage  Daily EtOH use - 1. Only drinks 2 beers at night 2. No h/o withdrawal in past per pt 3. CIWA ordered  DVT prophylaxis: Heparin [place] Code Status: Full Family Communication: No family in room Disposition Plan: Home after SBO resolved and pt able to take  POs Consults called: Gen surg Admission status: Admit to inpatient       Nutritional status:         Admission status:    Status is: Inpatient  Remains inpatient appropriate because:Inpatient level of care appropriate due to severity of illness   Dispo: The patient is from: Home              Anticipated d/c is to: Home              Anticipated d/c date is: 2 days              Patient currently is not medically stable to d/c.        Procedures:   No admission procedures for hospital encounter.     Antimicrobials:  Anti-infectives (From admission, onward)   None       Medication:  . darifenacin  15 mg Oral Daily  . folic acid  1 mg Oral Daily  . heparin  5,000 Units Subcutaneous Q8H  . insulin aspart  0-9 Units Subcutaneous Q4H  . multivitamin with minerals  1 tablet Oral Daily  . pantoprazole (PROTONIX) IV  40 mg Intravenous Q12H  . pravastatin  20 mg Oral Daily  . thiamine  100 mg Oral Daily  Or  . thiamine  100 mg Intravenous Daily  . verapamil  180 mg Oral Daily    LORazepam **OR** LORazepam, ondansetron **OR** ondansetron (ZOFRAN) IV   Objective:   Vitals:   08/14/20 0232 08/14/20 0402 08/14/20 0657 08/14/20 0857  BP: 104/66 (!) 97/51 (!) 98/56 111/71  Pulse: (!) 111 (!) 119 (!) 112 (!) 109  Resp: 18 17 18 17   Temp: 98.5 F (36.9 C) 98.7 F (37.1 C) 98.6 F (37 C) 98.9 F (37.2 C)  TempSrc: Oral Oral Oral Oral  SpO2: 93% 90% 91% 94%  Weight:      Height:        Intake/Output Summary (Last 24 hours) at 08/14/2020 1024 Last data filed at 08/14/2020 0900 Gross per 24 hour  Intake 1000 ml  Output 800 ml  Net 200 ml   Filed Weights   08/13/20 0914  Weight: 93.9 kg     Examination:   Physical Exam  Constitution: NG tube in place alert, cooperative, no distress,  Appears calm and comfortable  Psychiatric: Normal and stable mood and affect, cognition intact,   HEENT: Normocephalic, PERRL, otherwise with in Normal limits   Chest:Chest symmetric Cardio vascular:  S1/S2, RRR, No murmure, No Rubs or Gallops  pulmonary: Clear to auscultation bilaterally, respirations unlabored, negative wheezes / crackles Abdomen: Soft, non-tender, mild abdominal distention, hypoactive bowel sounds,no masses, no organomegaly Muscular skeletal: Limited exam - in bed, able to move all 4 extremities, Normal strength,  Neuro: CNII-XII intact. , normal motor and sensation, reflexes intact  Extremities: No pitting edema lower extremities, +2 pulses  Skin: Dry, warm to touch, negative for any Rashes, No open wounds Wounds: per nursing documentation    ------------------------------------------------------------------------------------------------------------------------------------------    LABs:  CBC Latest Ref Rng & Units 08/14/2020 08/13/2020 10/21/2017  WBC 4.0 - 10.5 K/uL 10.7(H) 11.0(H) 7.2  Hemoglobin 13.0 - 17.0 g/dL 14.0 15.3 10.5(L)  Hematocrit 39 - 52 % 41.1 45.3 31.6(L)  Platelets 150 - 400 K/uL 316 314 211   CMP Latest Ref Rng & Units 08/14/2020 08/13/2020 10/21/2017  Glucose 70 - 99 mg/dL 198(H) 352(H) 98  BUN 8 - 23 mg/dL 52(H) 33(H) 11  Creatinine 0.61 - 1.24 mg/dL 4.59(H) 2.30(H) 0.64  Sodium 135 - 145 mmol/L 141 137 142  Potassium 3.5 - 5.1 mmol/L 3.1(L) 3.8 3.7  Chloride 98 - 111 mmol/L 85(L) 90(L) 110  CO2 22 - 32 mmol/L 36(H) 25 26  Calcium 8.9 - 10.3 mg/dL 10.8(H) 11.1(H) 8.9  Total Protein 6.5 - 8.1 g/dL - 8.5(H) -  Total Bilirubin 0.3 - 1.2 mg/dL - 1.2 -  Alkaline Phos 38 - 126 U/L - 63 -  AST 15 - 41 U/L - 32 -  ALT 0 - 44 U/L - 46(H) -       Micro Results Recent Results (from the past 240 hour(s))  Resp Panel by RT PCR (RSV, Flu A&B, Covid) - Nasopharyngeal Swab     Status: None   Collection Time: 08/14/20 12:55 AM   Specimen: Nasopharyngeal Swab  Result Value Ref Range Status   SARS Coronavirus 2 by RT PCR NEGATIVE NEGATIVE Final    Comment: (NOTE) SARS-CoV-2 target nucleic acids are NOT  DETECTED.  The SARS-CoV-2 RNA is generally detectable in upper respiratoy specimens during the acute phase of infection. The lowest concentration of SARS-CoV-2 viral copies this assay can detect is 131 copies/mL. A negative result does not preclude SARS-Cov-2 infection and should not be used as the sole basis for  treatment or other patient management decisions. A negative result may occur with  improper specimen collection/handling, submission of specimen other than nasopharyngeal swab, presence of viral mutation(s) within the areas targeted by this assay, and inadequate number of viral copies (<131 copies/mL). A negative result must be combined with clinical observations, patient history, and epidemiological information. The expected result is Negative.  Fact Sheet for Patients:  PinkCheek.be  Fact Sheet for Healthcare Providers:  GravelBags.it  This test is no t yet approved or cleared by the [place] FDA and  has been authorized for detection and/or diagnosis of SARS-CoV-2 by FDA under an Emergency Use Authorization (EUA). This EUA will remain  in effect (meaning this test can be used) for the duration of the COVID-19 declaration under Section 564(b)(1) of the Act, 21 U.S.C. section 360bbb-3(b)(1), unless the authorization is terminated or revoked sooner.     Influenza A by PCR NEGATIVE NEGATIVE Final   Influenza B by PCR NEGATIVE NEGATIVE Final    Comment: (NOTE) The Xpert Xpress SARS-CoV-2/FLU/RSV assay is intended as an aid in  the diagnosis of influenza from Nasopharyngeal swab specimens and  should not be used as a sole basis for treatment. Nasal washings and  aspirates are unacceptable for Xpert Xpress SARS-CoV-2/FLU/RSV  testing.  Fact Sheet for Patients: PinkCheek.be  Fact Sheet for Healthcare Providers: GravelBags.it  This test is not yet  approved or cleared by the [place] FDA and  has been authorized for detection and/or diagnosis of SARS-CoV-2 by  FDA under an Emergency Use Authorization (EUA). This EUA will remain  in effect (meaning this test can be used) for the duration of the  Covid-19 declaration under Section 564(b)(1) of the Act, 21  U.S.C. section 360bbb-3(b)(1), unless the authorization is  terminated or revoked.    Respiratory Syncytial Virus by PCR NEGATIVE NEGATIVE Final    Comment: (NOTE) Fact Sheet for Patients: PinkCheek.be  Fact Sheet for Healthcare Providers: GravelBags.it  This test is not yet approved or cleared by the [place] FDA and  has been authorized for detection and/or diagnosis of SARS-CoV-2 by  FDA under an Emergency Use Authorization (EUA). This EUA will remain  in effect (meaning this test can be used) for the duration of the  COVID-19 declaration under Section 564(b)(1) of the Act, 21 U.S.C.  section 360bbb-3(b)(1), unless the authorization is terminated or  revoked. Performed at [facility], [place] [address], [place], [place] [address]     Radiology Reports CT ABDOMEN PELVIS WO CONTRAST  Result Date: 08/13/2020 CLINICAL DATA:  Constipation for 4 days. Suspected bowel obstruction. History of diabetes and acute kidney injury. EXAM: CT ABDOMEN AND PELVIS WITHOUT CONTRAST TECHNIQUE: Multidetector CT imaging of the abdomen and pelvis was performed following the standard protocol without IV contrast. COMPARISON:  10/20/2017 FINDINGS: Lower chest: Mild atelectasis in the lung bases. Distal esophagus is dilated with an air-fluid level suggesting reflux or dysmotility. Hepatobiliary: No focal liver abnormality is seen. No gallstones, gallbladder wall thickening, or biliary dilatation. Pancreas: Unremarkable. No pancreatic ductal dilatation or surrounding inflammatory changes. Spleen: Normal in size without focal  abnormality. Adrenals/Urinary Tract: Adrenal glands are unremarkable. Kidneys are normal, without renal calculi, focal lesion, or hydronephrosis. Bladder is unremarkable. Stomach/Bowel: The stomach and jejunum are markedly distended with fluid. The distal small bowel are decompressed. Transition zone appears to be in the left lower quadrant. No obstructing mass is identified. Likely adhesions. Scattered stool in the colon without colonic distention or wall thickening. Colonic diverticula  without evidence of diverticulitis. Appendix is normal. Vascular/Lymphatic: No significant vascular findings are present. No enlarged abdominal or pelvic lymph nodes. Reproductive: Prostate gland is enlarged. Other: No free air or free fluid in the abdomen. Abdominal wall musculature appears intact. Musculoskeletal: Degenerative changes in the spine. No destructive bone lesions. IMPRESSION: 1. Small bowel obstruction with transition zone in the left lower quadrant. Likely adhesions. 2. Enlarged prostate gland. 3. Mild atelectasis in the lung bases. 4. Distal esophagus is dilated with an air-fluid level suggesting reflux. Electronically Signed   By: Lucienne Capers M.D.   On: 08/13/2020 23:37    SIGNED: Deatra James, MD, FACP, FHM. [facility],  Pager (please use [email or web address] to page/text)  If 7PM-7AM, please contact night-coverage [email or web address] Ota [facility] 08/14/2020, 10:24 AM

## 2020-08-14 NOTE — ED Notes (Signed)
Attempted report x1. 

## 2020-08-14 NOTE — Progress Notes (Signed)
   08/14/20 0657 08/14/20 0830 08/14/20 0857  Assess: MEWS Score  Temp 98.6 F (37 C)  --  98.9 F (37.2 C)  BP (!) 98/56  --  111/71  Pulse Rate (!) 112  --  (!) 109  Resp 18  --  17  Level of Consciousness  --  Alert  --   SpO2 91 %  --  94 %  O2 Device Room Air  --   --   Assess: MEWS Score  MEWS Temp 0 0 0  MEWS Systolic 1 1 0  MEWS Pulse 2 2 1   MEWS RR 0 0 0  MEWS LOC 0 0 0  MEWS Score 3 3 1   MEWS Score Color Yellow Yellow Green  Assess: if the MEWS score is Yellow or Red  Were vital signs taken at a resting state?  --   --  Yes  Focused Assessment  --   --  No change from prior assessment  Early Detection of Sepsis Score *See Row Information*  --   --  Low  MEWS guidelines implemented *See Row Information*  --   --  No, vital signs rechecked  Treat  MEWS Interventions  --   --  Administered scheduled meds/treatments;Escalated (See documentation below)  Pain Scale  --  0-10  --   Pain Score  --  0  --   Escalate  MEWS: Escalate  --   --  Yellow: discuss with charge nurse/RN and consider discussing with provider and RRT  Notify: Provider  Provider Name/Title  --   --  Shahmedi  Date Provider Notified  --   --  08/14/20  Time Provider Notified  --   --  0900  Notification Type  --   --  Face-to-face  Notification Reason  --   --  Other (Comment)  Response  --   --  See new orders  Date of Provider Response  --   --  08/14/20  Time of Provider Response  --   --  0900  Document  Patient Outcome  --   --  Stabilized after interventions   RN assessing patient, MD at bedside. Patient with NG clogged/not working, adjusted tube and irrigated port, NG tube working again. Patient denies pain or SOB. Meds changed to IV as patient having 100 ml/output from NG an hour. MD aware of increased output, unable to turn off suction to give PO meds.  Patient advised of status. VS rechecked, patient now green MEWS. Charge RN Blanch Media also notified.

## 2020-08-14 NOTE — Plan of Care (Signed)
  Problem: Education: Goal: Knowledge of General Education information will improve Description: Including pain rating scale, medication(s)/side effects and non-pharmacologic comfort measures Outcome: Progressing   Problem: Activity: Goal: Risk for activity intolerance will decrease Outcome: Progressing  Patient up to chair with assist, calls for help to manage lines.  Problem: Nutrition: Goal: Adequate nutrition will be maintained Outcome: Not Progressing  Pt NPO with GI tube. Gastroagaffin given for imaging to be done at [place].

## 2020-08-14 NOTE — Progress Notes (Signed)
Arrived to 6n25 from ED on stretcher at this time. Denies nausea/pain.

## 2020-08-14 NOTE — H&P (Signed)
History and Physical    Jeremiah Bowman WYO:[id number] DOB: 03-15-1956 DOA: 08/13/2020  PCP: Everardo Beals, NP  Patient coming from: Home  I have personally briefly reviewed patient's old medical records in [place]  Chief Complaint: Abd pain  HPI: Jeremiah Bowman is a 64 y.o. male with medical history significant of DM2, HTN, prior SBOs of unclear cause.  No h/o abd surgery, had colonoscopy in 2019 due to recurrent SBOs but nothing found other than 2 tubular adenomas.  Last SBO in 2019.  Pt presents to ED with c/o 3 day h/o abd pain, N/V no BMs.  Pain is diffuse, across lower abdomen.  Pt felt fine prior to onset of symptoms.  No fevers, chills, no CP.   ED Course: CT abd/pevlis confirms SBO.  No obvious tumor or mass identified.   Review of Systems: As per HPI, otherwise all review of systems negative.  Past Medical History:  Diagnosis Date  . Acute kidney injury ([place])   . Diabetes ([place])   . DKA (diabetic ketoacidoses) ([place])   . Hyperlipidemia   . Hypertension   . Small bowel obstruction ([place]) 10/2015    Past Surgical History:  Procedure Laterality Date  . ESOPHAGOGASTRODUODENOSCOPY N/A 10/20/2017   Procedure: ESOPHAGOGASTRODUODENOSCOPY (EGD);  Surgeon: Milus Banister, MD;  Location: [facility] ENDOSCOPY;  Service: Endoscopy;  Laterality: N/A;  . ESOPHAGOGASTRODUODENOSCOPY ENDOSCOPY    . GIVENS CAPSULE STUDY N/A 01/31/2018   Procedure: GIVENS CAPSULE STUDY;  Surgeon: Milus Banister, MD;  Location: [facility] ENDOSCOPY;  Service: Endoscopy;  Laterality: N/A;  . TONSILLECTOMY     patient denies  . WRIST SURGERY       reports that he has quit smoking. He has never used smokeless tobacco. He reports current alcohol use of about 16.0 standard drinks of alcohol per week. He reports that he does not use drugs.  Allergies  Allergen Reactions  . Malarone [Atovaquone-Proguanil Hcl] Itching    Family History  Problem Relation Age of Onset  . Hypertension Mother   .  Hypertension Father      Prior to Admission medications   Medication Sig Start Date End Date Taking? Authorizing Provider  aspirin 81 MG chewable tablet Chew 81 mg by mouth daily.   Yes [provider]  ibuprofen (ADVIL) 800 MG tablet Take 800 mg by mouth in the morning and at bedtime.   Yes [provider]  lisinopril-hydrochlorothiazide (ZESTORETIC) 20-12.5 MG tablet Take 1 tablet by mouth 2 (two) times daily. 07/02/20  Yes [provider]  metFORMIN (GLUCOPHAGE) 1000 MG tablet Take 1,000 mg by mouth 2 (two) times daily with a meal.   Yes [provider]  pantoprazole (PROTONIX) 40 MG tablet Take 1 tablet (40 mg total) by mouth 2 (two) times daily before a meal. 10/21/17  Yes Colbert Ewing, MD  pravastatin (PRAVACHOL) 20 MG tablet Take 20 mg by mouth daily.   Yes [provider]  solifenacin (VESICARE) 10 MG tablet Take 10 mg by mouth daily. 08/12/20  Yes [provider]  verapamil (CALAN-SR) 180 MG CR tablet Take 1 tablet by mouth daily.   Yes [provider]  cyclobenzaprine (FLEXERIL) 5 MG tablet Take 5 mg by mouth 3 (three) times daily as needed for muscle spasms.    [provider]  NEEDLE, DISP, 30 G (BD DISP NEEDLES) 30G X 1/2" MISC 10 Units by Does not apply route daily. 05/16/17   Allie Bossier, MD    Physical Exam: Vitals:  08/13/20 1600 08/13/20 1630 08/13/20 1700 08/13/20 1730  BP: 131/86 125/87 (!) 131/95 107/84  Pulse: (!) 133 (!) 120 (!) 121 (!) 126  Resp: 18     Temp:      TempSrc:      SpO2: 97% 93% 91% 92%  Weight:      Height:        Constitutional: NAD, calm, comfortable Eyes: PERRL, lids and conjunctivae normal ENMT: Mucous membranes are moist. Posterior pharynx clear of any exudate or lesions.Normal dentition.  Neck: normal, supple, no masses, no thyromegaly Respiratory: clear to auscultation bilaterally, no wheezing, no crackles. Normal respiratory effort. No accessory muscle use.   Cardiovascular: Regular rate and rhythm, no murmurs / rubs / gallops. No extremity edema. 2+ pedal pulses. No carotid bruits.  Abdomen: no tenderness, no masses palpated. No hepatosplenomegaly. Bowel sounds positive.  Musculoskeletal: no clubbing / cyanosis. No joint deformity upper and lower extremities. Good ROM, no contractures. Normal muscle tone.  Skin: no rashes, lesions, ulcers. No induration Neurologic: CN 2-12 grossly intact. Sensation intact, DTR normal. Strength 5/5 in all 4.  Psychiatric: Normal judgment and insight. Alert and oriented x 3. Normal mood.    Labs on Admission: I have personally reviewed following labs and imaging studies  CBC: Recent Labs  Lab 08/13/20 0958  WBC 11.0*  HGB 15.3  HCT 45.3  MCV 85.8  PLT 798   Basic Metabolic Panel: Recent Labs  Lab 08/13/20 0958  NA 137  K 3.8  CL 90*  CO2 25  GLUCOSE 352*  BUN 33*  CREATININE 2.30*  CALCIUM 11.1*   GFR: Estimated Creatinine Clearance: 35.6 mL/min (A) (by C-G formula based on SCr of 2.3 mg/dL (H)). Liver Function Tests: Recent Labs  Lab 08/13/20 0958  AST 32  ALT 46*  ALKPHOS 63  BILITOT 1.2  PROT 8.5*  ALBUMIN 4.3   Recent Labs  Lab 08/13/20 0958  LIPASE 25   No results for input(s): AMMONIA in the last 168 hours. Coagulation Profile: No results for input(s): INR, PROTIME in the last 168 hours. Cardiac Enzymes: No results for input(s): CKTOTAL, CKMB, CKMBINDEX, TROPONINI in the last 168 hours. BNP (last 3 results) No results for input(s): PROBNP in the last 8760 hours. HbA1C: No results for input(s): HGBA1C in the last 72 hours. CBG: No results for input(s): GLUCAP in the last 168 hours. Lipid Profile: No results for input(s): CHOL, HDL, LDLCALC, TRIG, CHOLHDL, LDLDIRECT in the last 72 hours. Thyroid Function Tests: No results for input(s): TSH, T4TOTAL, FREET4, T3FREE, THYROIDAB in the last 72 hours. Anemia Panel: No results for input(s): VITAMINB12, FOLATE, FERRITIN,  TIBC, IRON, RETICCTPCT in the last 72 hours. Urine analysis:    Component Value Date/Time   COLORURINE YELLOW 10/18/2017 1248   APPEARANCEUR HAZY (A) 10/18/2017 1248   LABSPEC 1.020 10/18/2017 1248   PHURINE 5.0 10/18/2017 1248   GLUCOSEU NEGATIVE 10/18/2017 1248   HGBUR NEGATIVE 10/18/2017 1248   BILIRUBINUR SMALL (A) 10/18/2017 1248   KETONESUR 5 (A) 10/18/2017 1248   PROTEINUR 30 (A) 10/18/2017 1248   NITRITE NEGATIVE 10/18/2017 1248   LEUKOCYTESUR SMALL (A) 10/18/2017 1248    Radiological Exams on Admission: CT ABDOMEN PELVIS WO CONTRAST  Result Date: 08/13/2020 CLINICAL DATA:  Constipation for 4 days. Suspected bowel obstruction. History of diabetes and acute kidney injury. EXAM: CT ABDOMEN AND PELVIS WITHOUT CONTRAST TECHNIQUE: Multidetector CT imaging of the abdomen and pelvis was performed following the standard protocol without IV contrast. COMPARISON:  10/20/2017 FINDINGS:  Lower chest: Mild atelectasis in the lung bases. Distal esophagus is dilated with an air-fluid level suggesting reflux or dysmotility. Hepatobiliary: No focal liver abnormality is seen. No gallstones, gallbladder wall thickening, or biliary dilatation. Pancreas: Unremarkable. No pancreatic ductal dilatation or surrounding inflammatory changes. Spleen: Normal in size without focal abnormality. Adrenals/Urinary Tract: Adrenal glands are unremarkable. Kidneys are normal, without renal calculi, focal lesion, or hydronephrosis. Bladder is unremarkable. Stomach/Bowel: The stomach and jejunum are markedly distended with fluid. The distal small bowel are decompressed. Transition zone appears to be in the left lower quadrant. No obstructing mass is identified. Likely adhesions. Scattered stool in the colon without colonic distention or wall thickening. Colonic diverticula without evidence of diverticulitis. Appendix is normal. Vascular/Lymphatic: No significant vascular findings are present. No enlarged abdominal or pelvic  lymph nodes. Reproductive: Prostate gland is enlarged. Other: No free air or free fluid in the abdomen. Abdominal wall musculature appears intact. Musculoskeletal: Degenerative changes in the spine. No destructive bone lesions. IMPRESSION: 1. Small bowel obstruction with transition zone in the left lower quadrant. Likely adhesions. 2. Enlarged prostate gland. 3. Mild atelectasis in the lung bases. 4. Distal esophagus is dilated with an air-fluid level suggesting reflux. Electronically Signed   By: Lucienne Capers M.D.   On: 08/13/2020 23:37    EKG: Independently reviewed.  Assessment/Plan Principal Problem:   SBO (small bowel obstruction) (HCC) Active Problems:   Alcohol dependency (HCC)   HTN (hypertension)   Acute kidney injury ([place])   Diabetes mellitus with complication ([place])    1. SBO - 1. Gen surg consult 2. NPO 3. IVF 4. NGT 2. AKI - 1. Likely pre-renal / ATN secondary to #1 above 2. Hold zestoretic 3. IVF 4. Strict intake and output 5. Repeat BMP in AM 6. Hold NSAID 3. HTN - 1. Cont verapimil 2. Hold zestoretic 4. DM2 - 1. Hold metformin 2. SSI sensitive Q4H 5. Daily EtOH use - 1. Only drinks 2 beers at night 2. No h/o withdrawal in past per pt 3. CIWA ordered  DVT prophylaxis: Heparin [place] Code Status: Full Family Communication: No family in room Disposition Plan: Home after SBO resolved and pt able to take POs Consults called: Gen surg Admission status: Admit to inpatient  Severity of Illness: The appropriate patient status for this patient is INPATIENT. Inpatient status is judged to be reasonable and necessary in order to provide the required intensity of service to ensure the patient's safety. The patient's presenting symptoms, physical exam findings, and initial radiographic and laboratory data in the context of their chronic comorbidities is felt to place them at high risk for further clinical deterioration. Furthermore, it is not anticipated that the patient  will be medically stable for discharge from the hospital within 2 midnights of admission. The following factors support the patient status of inpatient.   IP status for SBO requiring NGT placement.  * I certify that at the point of admission it is my clinical judgment that the patient will require inpatient hospital care spanning beyond 2 midnights from the point of admission due to high intensity of service, high risk for further deterioration and high frequency of surveillance required.*    Jeremiah Bowman M. DO [facility]  How to contact the [facility] Attending or Consulting provider [place] or covering provider during after hours [place], for this patient?  1. Check the care team in [facility] and look for a) attending/consulting [facility] provider listed and b) the [facility] team listed 2. Log into  [email or web address]  Amion Physician Scheduling and messaging for groups and whole hospitals  On call and physician scheduling software for group practices, residents, hospitalists and other medical providers for call, clinic, rotation and shift schedules. OnCall Enterprise is a hospital-wide system for scheduling doctors and paging doctors on call. EasyPlot is for scientific plotting and data analysis.  [email or web address]  and use [facility]'s universal password to access. If you do not have the password, please contact the hospital operator.  3. Locate the [facility] provider you are looking for under [facility] and page to a number that you can be directly reached. 4. If you still have difficulty reaching the provider, please page the [facility] (Director on Call) for the Hospitalists listed on amion for assistance.  08/14/2020, 12:44 AM

## 2020-08-15 ENCOUNTER — Inpatient Hospital Stay (HOSPITAL_COMMUNITY): Payer: Medicare HMO

## 2020-08-15 DIAGNOSIS — K56609 Unspecified intestinal obstruction, unspecified as to partial versus complete obstruction: Secondary | ICD-10-CM | POA: Diagnosis not present

## 2020-08-15 LAB — CBC
HCT: 39.2 % (ref 39.0–52.0)
Hemoglobin: 12.9 g/dL — ABNORMAL LOW (ref 13.0–17.0)
MCH: 28.9 pg (ref 26.0–34.0)
MCHC: 32.9 g/dL (ref 30.0–36.0)
MCV: 87.7 fL (ref 80.0–100.0)
Platelets: 282 10*3/uL (ref 150–400)
RBC: 4.47 MIL/uL (ref 4.22–5.81)
RDW: 13.7 % (ref 11.5–15.5)
WBC: 10.1 10*3/uL (ref 4.0–10.5)
nRBC: 0 % (ref 0.0–0.2)

## 2020-08-15 LAB — GLUCOSE, CAPILLARY
Glucose-Capillary: 121 mg/dL — ABNORMAL HIGH (ref 70–99)
Glucose-Capillary: 127 mg/dL — ABNORMAL HIGH (ref 70–99)
Glucose-Capillary: 132 mg/dL — ABNORMAL HIGH (ref 70–99)
Glucose-Capillary: 139 mg/dL — ABNORMAL HIGH (ref 70–99)
Glucose-Capillary: 157 mg/dL — ABNORMAL HIGH (ref 70–99)
Glucose-Capillary: 189 mg/dL — ABNORMAL HIGH (ref 70–99)

## 2020-08-15 LAB — NA AND K (SODIUM & POTASSIUM), RAND UR
Potassium Urine: 18 mmol/L
Sodium, Ur: 54 mmol/L

## 2020-08-15 LAB — BASIC METABOLIC PANEL
Anion gap: 15 (ref 5–15)
BUN: 59 mg/dL — ABNORMAL HIGH (ref 8–23)
CO2: 37 mmol/L — ABNORMAL HIGH (ref 22–32)
Calcium: 9.4 mg/dL (ref 8.9–10.3)
Chloride: 95 mmol/L — ABNORMAL LOW (ref 98–111)
Creatinine, Ser: 2.97 mg/dL — ABNORMAL HIGH (ref 0.61–1.24)
GFR calc Af Amer: 24 mL/min — ABNORMAL LOW (ref >60)
GFR calc non Af Amer: 21 mL/min — ABNORMAL LOW (ref >60)
Glucose, Bld: 130 mg/dL — ABNORMAL HIGH (ref 70–99)
Potassium: 3.1 mmol/L — ABNORMAL LOW (ref 3.5–5.1)
Sodium: 147 mmol/L — ABNORMAL HIGH (ref 135–145)

## 2020-08-15 LAB — URINALYSIS, ROUTINE W REFLEX MICROSCOPIC
Bilirubin Urine: NEGATIVE
Glucose, UA: NEGATIVE mg/dL
Hgb urine dipstick: NEGATIVE
Ketones, ur: NEGATIVE mg/dL
Leukocytes,Ua: NEGATIVE
Nitrite: NEGATIVE
Protein, ur: NEGATIVE mg/dL
Specific Gravity, Urine: 1.011 (ref 1.005–1.030)
pH: 7 (ref 5.0–8.0)

## 2020-08-15 LAB — CREATININE, URINE, RANDOM: Creatinine, Urine: 72.07 mg/dL

## 2020-08-15 LAB — OSMOLALITY, URINE: Osmolality, Ur: 405 mOsm/kg (ref 300–900)

## 2020-08-15 MED ORDER — SODIUM CHLORIDE 0.45 % IV SOLN: INTRAVENOUS | Status: DC

## 2020-08-15 MED ORDER — CHLORPROMAZINE HCL 10 MG PO TABS
10.0000 mg | ORAL_TABLET | Freq: Three times a day (TID) | ORAL | Status: DC | PRN
  Administered 2020-08-15: 10 mg via ORAL
  Filled 2020-08-15 (×2): qty 1

## 2020-08-15 NOTE — Progress Notes (Signed)
PROGRESS NOTE  Jeremiah Bowman  DOB: 1955-12-19  PCP: Everardo Beals, NP LPF:[id number]  DOA: 08/13/2020  LOS: 1 day   Chief Complaint  Patient presents with  . Abdominal Pain   Brief narrative: Jeremiah Bowman is a 64 y.o. male with PMH of DM2, HTN, HLD and prior multiple episodes of SBOs managed conservatively. Patient presented to the ED on 08/13/2020 with complaint of progressively worsening diffuse crampy abdominal pain, nausea, vomiting that started 4 days ago, no bowel movement or flatus for 3 days. Patient had multiple SBOs in the past and those were all managed nonoperatively (10/24/2015, 05/13/2017, and 10/18/2017).  He had an EGD 10/20/17 that showed severe ulcerative esophagitis but was otherwise unremarkable. He underwent capsule endoscopy 01/31/18 that was negative. He underwent colonoscopy 03/01/2018 by Dr. Ardis Hughs that showed two polyps but was otherwise negative. He has never had any abdominal surgeries.    In the ED, CT abdomen pelvis showed small bowel obstruction with transition zone in the left lower quadrant. He was also found to have AKI and electrolyte abnormalities likely 2/2 dehydration.  Patient was admitted under hospitalist service.   General surgery consultation was obtained.    Subjective: Patient was seen and examined this morning.  Pleasant elderly African-American male.  Not in distress. Chart reviewed No fever.  Tachycardia improving, 94 this morning, blood pressure stable, breathing on room air Lab this morning with sodium elevated to 147, potassium low at 3.1, serum bicarb elevated to 37, creatinine improving to 2.97, A1c 7  Assessment/Plan: Recurrent small bowel obstruction -Patient has no history of abdominal surgery but has been admitted multiple times in the past with small bowel obstruction all improving with conservative management. -This admission, he presented with 4 days history of progressive bowel obstruction confirmed by CT scan. -General surgery  consult appreciated. -Improving with conservative management.   -Passing flatus.  NG tube removed today.  Started on clears.  If able to tolerate, advance diet with possible plan to discharge tomorrow. -Encourage ambulation. -Unknown etiology of recurrent bowel obstruction.  AKI -Patient has risk factors for chronic kidney disease with his last creatinine from 2018 was normal. -Creatinine was 2.3 on presentation, worsened to peak at 4.6 on 9/29, improving again today at 2.97. -Renal ultrasound normal.  Obtain urinalysis and urine electrolytes. -Continue to monitor on IV fluid. Recent Labs    08/13/20 0958 08/14/20 0505 08/15/20 0247  BUN 33* 52* 59*  CREATININE 2.30* 4.59* 2.97*   Hypernatremia -Sodium level at 147 today. -He has been getting normal saline at 125 mill per hour for the last 36 hours. -Switch fluid to half-normal saline at 100 mill per hour  Metabolic alkalosis -Serum bicarbonate level has been running elevated in the 30s.  No history of COPD.   -Primary metabolic alkalosis versus metabolic compensation to respiratory acidosis. -Obtain VBG.  Essential hypertension -Home meds include lisinopril 20 mg daily, HCTZ 12.5 mg daily, verapamil 180 mg daily. -Currently remains on verapamil.  Lisinopril and HCTZ on hold because of AKI.  Type 2 diabetes mellitus -A1c 7 on 9/29 -On Metformin 1000 mg twice daily at home.  Currently on hold. -Continue sliding scale insulin with Accu-Cheks. Recent Labs  Lab 08/14/20 2005 08/15/20 0000 08/15/20 0410 08/15/20 0839 08/15/20 1233  GLUCAP 138* 127* 121* 139* 189*   Hyperlipidemia -On pravastatin 20 mg daily and aspirin 81 mg daily. -No history of cardiac event  Daily alcohol use -Only drinks 2 beers at night -Monitor for withdrawal symptoms Recent Labs  Lab  08/13/20 0958  AST 32  ALT 46*  ALKPHOS 63  BILITOT 1.2  PROT 8.5*  ALBUMIN 4.3   Mobility: Encourage ambulation Code Status:   Code Status: Full Code   Nutritional status: Body mass index is 31.47 kg/m.     Diet Order            Diet clear liquid Room service appropriate? Yes; Fluid consistency: Thin  Diet effective now                DVT prophylaxis: heparin injection 5,000 Units Start: 08/14/20 0100   Antimicrobials:  None Fluid: Half NS at 100 mill per hour Consultants: General surgery Family Communication:  None at bedside  Status is: Inpatient  Remains inpatient appropriate because: AKI   Dispo: The patient is from: Home              Anticipated d/c is to: Home              Anticipated d/c date is: 2 days              Patient currently is not medically stable to d/c.       Infusions:  . sodium chloride 100 mL/hr at 08/15/20 0839  . chlorproMAZINE (THORAZINE) IV      Scheduled Meds: . darifenacin  15 mg Oral Daily  . folic acid  1 mg Oral Daily  . heparin  5,000 Units Subcutaneous Q8H  . insulin aspart  0-9 Units Subcutaneous Q4H  . multivitamin with minerals  1 tablet Oral Daily  . pantoprazole (PROTONIX) IV  40 mg Intravenous Q12H  . pravastatin  20 mg Oral Daily  . thiamine  100 mg Oral Daily   Or  . thiamine  100 mg Intravenous Daily  . verapamil  180 mg Oral Daily    Antimicrobials: Anti-infectives (From admission, onward)   None      PRN meds: chlorproMAZINE (THORAZINE) IV, chlorproMAZINE, LORazepam **OR** LORazepam, ondansetron **OR** ondansetron (ZOFRAN) IV, phenol   Objective: Vitals:   08/15/20 0559 08/15/20 1000  BP: 130/64 125/69  Pulse: 94 94  Resp: 18 18  Temp: 98.5 F (36.9 C) (!) 97.5 F (36.4 C)  SpO2: 95% 98%    Intake/Output Summary (Last 24 hours) at 08/15/2020 1344 Last data filed at 08/15/2020 1040 Gross per 24 hour  Intake 1863.34 ml  Output 3000 ml  Net -1136.66 ml   Filed Weights   08/13/20 0914  Weight: 93.9 kg   Weight change:  Body mass index is 31.47 kg/m.   Physical Exam: General exam: Appears calm and comfortable.  Not in physical  distress Skin: No rashes, lesions or ulcers. HEENT: Atraumatic, normocephalic, supple neck, no obvious bleeding Lungs: Clear to auscultation bilaterally CVS: Regular rate and rhythm, no murmur GI/Abd soft, distended from obesity, nontender, bowel sound present CNS: Alert, awake, oriented x3 Psychiatry: Mood appropriate Extremities: No pedal edema, no calf tenderness  Data Review: I have personally reviewed the laboratory data and studies available.  Recent Labs  Lab 08/13/20 0958 08/14/20 0505 08/15/20 0247  WBC 11.0* 10.7* 10.1  HGB 15.3 14.0 12.9*  HCT 45.3 41.1 39.2  MCV 85.8 83.5 87.7  PLT 314 316 282   Recent Labs  Lab 08/13/20 0958 08/14/20 0505 08/15/20 0247  NA 137 141 147*  K 3.8 3.1* 3.1*  CL 90* 85* 95*  CO2 25 36* 37*  GLUCOSE 352* 198* 130*  BUN 33* 52* 59*  CREATININE 2.30* 4.59* 2.97*  CALCIUM  11.1* 10.8* 9.4  MG  --  1.6*  --   PHOS  --  4.5  --     F/u labs ordered.  Signed, Terrilee Croak, MD [facility] 08/15/2020

## 2020-08-15 NOTE — Progress Notes (Signed)
Subjective/Chief Complaint: Xray not much better last night but clinically this am he states abdomen now normal and is passing a lot of flatus, no complaints   Objective: Vital signs in last 24 hours: Temp:  [98.4 F (36.9 C)-98.9 F (37.2 C)] 98.5 F (36.9 C) (09/30 0559) Pulse Rate:  [94-117] 94 (09/30 0559) Resp:  [17-18] 18 (09/30 0559) BP: (102-133)/(64-103) 130/64 (09/30 0559) SpO2:  [92 %-99 %] 95 % (09/30 0559) Last BM Date:  (pta)  Intake/Output from previous day: 09/29 0701 - 09/30 0700 In: 1623.3 [I.V.:1623.3] Out: 2850 [Urine:1200; Emesis/NG output:1650] Intake/Output this shift: No intake/output data recorded.  GI: soft nontender bs present nondistended  Lab Results:  Recent Labs    08/14/20 0505 08/15/20 0247  WBC 10.7* 10.1  HGB 14.0 12.9*  HCT 41.1 39.2  PLT 316 282   BMET Recent Labs    08/14/20 0505 08/15/20 0247  NA 141 147*  K 3.1* 3.1*  CL 85* 95*  CO2 36* 37*  GLUCOSE 198* 130*  BUN 52* 59*  CREATININE 4.59* 2.97*  CALCIUM 10.8* 9.4   PT/INR No results for input(s): LABPROT, INR in the last 72 hours. ABG No results for input(s): PHART, HCO3 in the last 72 hours.  Invalid input(s): PCO2, PO2  Studies/Results: CT ABDOMEN PELVIS WO CONTRAST  Result Date: 08/13/2020 CLINICAL DATA:  Constipation for 4 days. Suspected bowel obstruction. History of diabetes and acute kidney injury. EXAM: CT ABDOMEN AND PELVIS WITHOUT CONTRAST TECHNIQUE: Multidetector CT imaging of the abdomen and pelvis was performed following the standard protocol without IV contrast. COMPARISON:  10/20/2017 FINDINGS: Lower chest: Mild atelectasis in the lung bases. Distal esophagus is dilated with an air-fluid level suggesting reflux or dysmotility. Hepatobiliary: No focal liver abnormality is seen. No gallstones, gallbladder wall thickening, or biliary dilatation. Pancreas: Unremarkable. No pancreatic ductal dilatation or surrounding inflammatory changes. Spleen:  Normal in size without focal abnormality. Adrenals/Urinary Tract: Adrenal glands are unremarkable. Kidneys are normal, without renal calculi, focal lesion, or hydronephrosis. Bladder is unremarkable. Stomach/Bowel: The stomach and jejunum are markedly distended with fluid. The distal small bowel are decompressed. Transition zone appears to be in the left lower quadrant. No obstructing mass is identified. Likely adhesions. Scattered stool in the colon without colonic distention or wall thickening. Colonic diverticula without evidence of diverticulitis. Appendix is normal. Vascular/Lymphatic: No significant vascular findings are present. No enlarged abdominal or pelvic lymph nodes. Reproductive: Prostate gland is enlarged. Other: No free air or free fluid in the abdomen. Abdominal wall musculature appears intact. Musculoskeletal: Degenerative changes in the spine. No destructive bone lesions. IMPRESSION: 1. Small bowel obstruction with transition zone in the left lower quadrant. Likely adhesions. 2. Enlarged prostate gland. 3. Mild atelectasis in the lung bases. 4. Distal esophagus is dilated with an air-fluid level suggesting reflux. Electronically Signed   By: Lucienne Capers M.D.   On: 08/13/2020 23:37   DG Abd Portable 1V  Result Date: 08/14/2020 CLINICAL DATA:  Small bowel obstruction EXAM: PORTABLE ABDOMEN - 1 VIEW COMPARISON:  08/14/2020 FINDINGS: There is an enteric tube in the left upper quadrant consistent with tip location in the upper stomach. There is a small amount of contrast material in the stomach. No contrast material is demonstrated in the remainder of the bowel. There is a markedly dilated small bowel loop in the mid abdomen. This is likely small bowel obstruction. Paucity of gas in the colon. IMPRESSION: 1. Enteric tube tip is in the upper stomach. 2. Dilated small bowel  loop in the mid abdomen, likely small bowel obstruction. 3. Contrast material seen only in the stomach. Electronically  Signed   By: Lucienne Capers M.D.   On: 08/14/2020 22:12   DG Abd Portable 1V  Result Date: 08/14/2020 CLINICAL DATA:  Nasogastric tube placement. EXAM: PORTABLE ABDOMEN - 1 VIEW COMPARISON:  October 19, 2017. FINDINGS: The bowel gas pattern is normal. Distal tip of nasogastric tube is seen in expected position of the distal stomach. No radio-opaque calculi or other significant radiographic abnormality are seen. IMPRESSION: Distal tip of nasogastric tube seen in expected position of the distal stomach. Electronically Signed   By: Marijo Conception M.D.   On: 08/14/2020 13:52    Anti-infectives: Anti-infectives (From admission, onward)   None      Assessment/Plan: SBO - No indication for emergency surgery - he is much better clinically and back to normal, I am still not sure of etiology of these but with this improvement and no evidence mass/cancer (plus he has had two years ago which would make unlikely given that time course) I think reasonable to treat conservatively. I removed ng today, will give clears. If does well can consider advancing later with dc possibly tomorrow.  - Mobilize for bowel function - Unknown etiology of the patients recurrent sbo's.He has been seen by our service for multiple SBO's in the past are all managed nonoperatively (10/24/2015, 05/13/2017, and 10/18/2017). He has a virgin abdomen with no prior abdominal surgeries. He underwent capsule endoscopy 01/31/18 that did not show cause of SBO's. He underwent colonoscopy 03/01/2018 by Dr. Ardis Hughs that showed two polyps but was otherwise negative.  FEN - clears, IVF, cr improving VTE - SCDs, Heparin ID - None   Rolm Bookbinder 08/15/2020

## 2020-08-15 NOTE — TOC Initial Note (Signed)
Transition of Care [facility]) - Initial/Assessment Note    Patient Details  Name: Jeremiah Bowman MRN: [id number] Date of Birth: 24-May-1956  Transition of Care [facility]) CM/SW Contact:    Alexander Mt, LCSW Phone Number: 08/15/2020, 12:24 PM  Clinical Narrative:                 CSW met with pt at bedside. Introduced self, role, reason for visit. Pt from home with wife Rose, no issues with DME, appointments or medications at this time. Pt feeling better. He confirms home address and PCP. He is agreeable to f/u appointment being made. A hospital appointment was made for pt on 10/6 at noon. Added to AVS  Acknowledged that consult was for substance use- pt drinks about 2 brandys every evening to wind down. He knows he should cut back but doesn't see any issue with current level of use and declines any additional resources. All questions answered, assisted pt with ensuring his phone is in the closet in zip loc bag along with his clothing in pt belongings bag.    Expected Discharge Plan: Home/Self Care Barriers to Discharge: Continued Medical Work up   Patient Goals and CMS Choice Patient states their goals for this hospitalization and ongoing recovery are:: feel better, get home soon  Expected Discharge Plan and Services Expected Discharge Plan: Home/Self Care In-house Referral: Clinical Social Work Discharge Planning Services: CM Consult, Follow-up appt scheduled Living arrangements for the past 2 months: Single Family Home    Prior Living Arrangements/Services Living arrangements for the past 2 months: Single Family Home Lives with:: Spouse Patient language and need for interpreter reviewed:: Yes (no needs) Do you feel safe going back to the place where you live?: Yes      Need for Family Participation in Patient Care: Yes (Comment) (support; assistance as needed) Care giver support system in place?: Yes (comment) (pt wife)   Criminal Activity/Legal Involvement Pertinent to Current  Situation/Hospitalization: No - Comment as needed  Permission Sought/Granted Permission sought to share information with : Family Supports, PCP Permission granted to share information with : Yes, Verbal Permission Granted  Share Information with NAME: Aldrin Engelhard  Permission granted to share info w AGENCY: PCP  Permission granted to share info w Relationship: wife     Emotional Assessment Appearance:: Appears stated age Attitude/Demeanor/Rapport: Engaged, Gracious Affect (typically observed): Accepting, Adaptable, Appropriate, Pleasant Orientation: : Oriented to Place, Oriented to Self, Oriented to  Time, Oriented to Situation Alcohol / Substance Use: Alcohol Use Psych Involvement: No (comment)  Admission diagnosis:  Small bowel obstruction (HCC) [K56.609] SBO (small bowel obstruction) ([place]) [K56.609] AKI (acute kidney injury) ([place]) [N17.9] Patient Active Problem List   Diagnosis Date Noted  . Melena   . Acute esophagitis   . Uncontrolled type 2 diabetes mellitus with complication ([place])   . Hypokalemia   . DKA (diabetic ketoacidoses) ([place]) 05/13/2017  . Dehydration   . Diabetes mellitus with complication ([place])   . Partial small bowel obstruction ([place])   . SBO (small bowel obstruction) ([place])   . Abdominal pain 10/24/2015  . Nausea and vomiting 10/24/2015  . Severe sepsis ([place]) 10/24/2015  . Alcohol dependency ([place]) 10/24/2015  . Diabetes mellitus ([place]) 10/24/2015  . HTN (hypertension) 10/24/2015  . Hyperlipidemia 10/24/2015  . Acute urinary retention 10/24/2015  . Acute renal failure (ARF) ([place]) 10/24/2015  . Sepsis ([place]) 10/24/2015  . Acute kidney injury ([place])   . Lactic acidosis    PCP:  Everardo Beals, NP Pharmacy:  [facility] [address] [place] [address] Phone: [phone] Fax: [phone]  Readmission Risk Interventions Readmission Risk Prevention Plan 08/15/2020  Post  Dischage Appt Complete  Medication Screening Complete  Transportation Screening Complete  Some recent data might be hidden

## 2020-08-16 ENCOUNTER — Inpatient Hospital Stay (HOSPITAL_COMMUNITY): Payer: Medicare HMO

## 2020-08-16 ENCOUNTER — Encounter (HOSPITAL_COMMUNITY): Payer: Self-pay | Admitting: Internal Medicine

## 2020-08-16 DIAGNOSIS — K56609 Unspecified intestinal obstruction, unspecified as to partial versus complete obstruction: Secondary | ICD-10-CM | POA: Diagnosis not present

## 2020-08-16 LAB — CBC
HCT: 32.7 % — ABNORMAL LOW (ref 39.0–52.0)
Hemoglobin: 10.8 g/dL — ABNORMAL LOW (ref 13.0–17.0)
MCH: 28.8 pg (ref 26.0–34.0)
MCHC: 33 g/dL (ref 30.0–36.0)
MCV: 87.2 fL (ref 80.0–100.0)
Platelets: 207 10*3/uL (ref 150–400)
RBC: 3.75 MIL/uL — ABNORMAL LOW (ref 4.22–5.81)
RDW: 13 % (ref 11.5–15.5)
WBC: 8 10*3/uL (ref 4.0–10.5)
nRBC: 0 % (ref 0.0–0.2)

## 2020-08-16 LAB — SURGICAL PCR SCREEN
MRSA, PCR: NEGATIVE
Staphylococcus aureus: POSITIVE — AB

## 2020-08-16 LAB — BASIC METABOLIC PANEL
Anion gap: 10 (ref 5–15)
BUN: 24 mg/dL — ABNORMAL HIGH (ref 8–23)
CO2: 33 mmol/L — ABNORMAL HIGH (ref 22–32)
Calcium: 8.8 mg/dL — ABNORMAL LOW (ref 8.9–10.3)
Chloride: 98 mmol/L (ref 98–111)
Creatinine, Ser: 1.35 mg/dL — ABNORMAL HIGH (ref 0.61–1.24)
GFR calc Af Amer: >60 mL/min (ref >60)
GFR calc non Af Amer: 55 mL/min — ABNORMAL LOW (ref >60)
Glucose, Bld: 106 mg/dL — ABNORMAL HIGH (ref 70–99)
Potassium: 3 mmol/L — ABNORMAL LOW (ref 3.5–5.1)
Sodium: 141 mmol/L (ref 135–145)

## 2020-08-16 LAB — BLOOD GAS, VENOUS
Acid-Base Excess: 10.4 mmol/L — ABNORMAL HIGH (ref 0.0–2.0)
Bicarbonate: 35.2 mmol/L — ABNORMAL HIGH (ref 20.0–28.0)
Drawn by: 4080
FIO2: 21
O2 Saturation: 49.7 %
Patient temperature: 37
pCO2, Ven: 54 mmHg (ref 44.0–60.0)
pH, Ven: 7.429 (ref 7.250–7.430)
pO2, Ven: 31.3 mmHg — CL (ref 32.0–45.0)

## 2020-08-16 LAB — GLUCOSE, CAPILLARY
Glucose-Capillary: 111 mg/dL — ABNORMAL HIGH (ref 70–99)
Glucose-Capillary: 121 mg/dL — ABNORMAL HIGH (ref 70–99)
Glucose-Capillary: 135 mg/dL — ABNORMAL HIGH (ref 70–99)
Glucose-Capillary: 145 mg/dL — ABNORMAL HIGH (ref 70–99)
Glucose-Capillary: 157 mg/dL — ABNORMAL HIGH (ref 70–99)
Glucose-Capillary: 98 mg/dL (ref 70–99)

## 2020-08-16 LAB — MAGNESIUM: Magnesium: 1.9 mg/dL (ref 1.7–2.4)

## 2020-08-16 MED ORDER — POTASSIUM CHLORIDE 20 MEQ PO PACK
40.0000 meq | PACK | Freq: Once | ORAL | Status: AC
  Administered 2020-08-16: 40 meq via ORAL
  Filled 2020-08-16: qty 2

## 2020-08-16 MED ORDER — CEFAZOLIN SODIUM-DEXTROSE 2-4 GM/100ML-% IV SOLN
2.0000 g | INTRAVENOUS | Status: AC
  Administered 2020-08-17: 2 g via INTRAVENOUS
  Filled 2020-08-16: qty 100

## 2020-08-16 MED ORDER — POTASSIUM CHLORIDE 10 MEQ/100ML IV SOLN
10.0000 meq | INTRAVENOUS | Status: AC
  Administered 2020-08-16 (×4): 10 meq via INTRAVENOUS
  Filled 2020-08-16: qty 100

## 2020-08-16 NOTE — Progress Notes (Signed)
Obtained Vital signs, Dr. Pietro Cassis notified.

## 2020-08-16 NOTE — Progress Notes (Signed)
PROGRESS NOTE  Jeremiah Bowman  DOB: June 18, 1956  PCP: Everardo Beals, NP [id number]  DOA: 08/13/2020  LOS: 2 days   Chief Complaint  Patient presents with  . Abdominal Pain   Brief narrative: Jeremiah Bowman is a 64 y.o. male with PMH of DM2, HTN, HLD and prior multiple episodes of SBOs managed conservatively. Patient presented to the ED on 08/13/2020 with complaint of progressively worsening diffuse crampy abdominal pain, nausea, vomiting that started 4 days ago, no bowel movement or flatus for 3 days. Patient had multiple SBOs in the past and those were all managed nonoperatively (10/24/2015, 05/13/2017, and 10/18/2017).  He had an EGD 10/20/17 that showed severe ulcerative esophagitis but was otherwise unremarkable. He underwent capsule endoscopy 01/31/18 that was negative. He underwent colonoscopy 03/01/2018 by Dr. Ardis Hughs that showed two polyps but was otherwise negative. He has never had any abdominal surgeries.    In the ED, CT abdomen pelvis showed small bowel obstruction with transition zone in the left lower quadrant. He was also found to have AKI and electrolyte abnormalities likely 2/2 dehydration.  Patient was admitted under hospitalist service.   General surgery consultation was obtained.    Subjective: Patient was seen and examined this morning.  Earlier today, patient was altered difficult to arouse.  At the time of my evaluation, patient was alert, awake, oriented x3.  I have given him Thorazine last night for reflux which probably made him more sleepy last night and this morning.    Assessment/Plan: Recurrent small bowel obstruction -Patient has no history of abdominal surgery but has been admitted multiple times in the past with small bowel obstruction all improving with conservative management. -This admission, he presented with 4 days history of progressive bowel obstruction confirmed by CT scan. -General surgery consult appreciated. -Noted a plan for general surgery to  perform diagnostic laparoscopy. -Encourage ambulation. -Unknown etiology of recurrent bowel obstruction.  AKI -Patient has risk factors for chronic kidney disease with his last creatinine from 2018 was normal. -Creatinine was 2.3 on presentation, worsened to peak at 4.6 on 9/29, creatinine improving down to 1.35 today. -Renal ultrasound normal.  Urinalysis normal with no protein. -Continue to monitor on IV fluid. Recent Labs    08/13/20 0958 08/14/20 0505 08/15/20 0247 08/16/20 0357  BUN 33* 52* 59* 24*  CREATININE 2.30* 4.59* 2.97* 1.35*   Hypernatremia -Improved with half-normal saline.  Continue the same. Recent Labs  Lab 08/13/20 0958 08/14/20 0505 08/15/20 0247 08/16/20 0357  NA 137 141 147* 141   Chronic respiratory acidosis with metabolic compensation -Chronically elevated serum bicarbonate level. -VBG from this morning showed normal pH with PCO2 elevated to 54. -Chronic hypercapnia is likely due to possible underlying sleep apnea. -Recommend sleep study as an outpatient.  Essential hypertension -Home meds include lisinopril 20 mg daily, HCTZ 12.5 mg daily, verapamil 180 mg daily. -Continue verapamil.  Lisinopril and HCTZ on hold because of AKI.  Type 2 diabetes mellitus -A1c 6 history as an outpatient.  On 9/29 -On Metformin 1000 mg twice daily at home.  Currently on hold. -Continue sliding scale insulin with Accu-Cheks. Recent Labs  Lab 08/15/20 1233 08/15/20 1652 08/15/20 2049 08/16/20 0038 08/16/20 0406  GLUCAP 189* 132* 157* 121* 98   Hyperlipidemia -On pravastatin 20 mg daily and aspirin 81 mg daily. -No history of cardiac event  Daily alcohol use -Only drinks 2 beers at night -Monitor for withdrawal symptoms Recent Labs  Lab 08/13/20 0958  AST 32  ALT 46*  ALKPHOS 63  BILITOT 1.2  PROT 8.5*  ALBUMIN 4.3   Discussed with surgery Dr. Donne Hazel.  He would like to take over patient under his service.  Medicine service will sign off at this  time.  Can be consulted as needed.  Mobility: Encourage ambulation Code Status:   Code Status: Full Code  Nutritional status: Body mass index is 31.47 kg/m.     Diet Order            Diet clear liquid Room service appropriate? Yes; Fluid consistency: Thin  Diet effective now                DVT prophylaxis: heparin injection 5,000 Units Start: 08/14/20 0100   Antimicrobials:  None Fluid: Half NS at 100 mill per hour Consultants: General surgery Family Communication:  None at bedside  Status is: Inpatient  Remains inpatient appropriate because: AKI  Dispo: The patient is from: Home              Anticipated d/c is to: Home              Anticipated d/c date is: Pending laparoscopic evaluation              Patient currently is not medically stable to d/c.       Infusions:  . sodium chloride Stopped (08/16/20 0508)  . potassium chloride 10 mEq (08/16/20 1123)    Scheduled Meds: . darifenacin  15 mg Oral Daily  . folic acid  1 mg Oral Daily  . heparin  5,000 Units Subcutaneous Q8H  . insulin aspart  0-9 Units Subcutaneous Q4H  . multivitamin with minerals  1 tablet Oral Daily  . pantoprazole (PROTONIX) IV  40 mg Intravenous Q12H  . pravastatin  20 mg Oral Daily  . thiamine  100 mg Oral Daily   Or  . thiamine  100 mg Intravenous Daily  . verapamil  180 mg Oral Daily    Antimicrobials: Anti-infectives (From admission, onward)   None      PRN meds: LORazepam **OR** LORazepam, ondansetron **OR** ondansetron (ZOFRAN) IV, phenol   Objective: Vitals:   08/16/20 0403 08/16/20 0853  BP: 122/77 131/90  Pulse: 69 70  Resp: 16 18  Temp: 98.2 F (36.8 C) 100.2 F (37.9 C)  SpO2: 97% 98%    Intake/Output Summary (Last 24 hours) at 08/16/2020 1221 Last data filed at 08/16/2020 1142 Gross per 24 hour  Intake 2188.03 ml  Output 1001 ml  Net 1187.03 ml   Filed Weights   08/13/20 0914  Weight: 93.9 kg   Weight change:  Body mass index is 31.47 kg/m.    Physical Exam: General exam: Appears calm and comfortable.  Not in physical distress Skin: No rashes, lesions or ulcers. HEENT: Atraumatic, normocephalic, supple neck, no obvious bleeding Lungs: Clear to auscultation bilaterally CVS: Regular rate and rhythm, no murmur GI/Abd soft, abdomen remains distended.  Nontender.  Bowel sound present CNS: Alert, awake, oriented x3 Psychiatry: Mood appropriate Extremities: No pedal edema, no calf tenderness  Data Review: I have personally reviewed the laboratory data and studies available.  Recent Labs  Lab 08/13/20 0958 08/14/20 0505 08/15/20 0247 08/16/20 0357  WBC 11.0* 10.7* 10.1 8.0  HGB 15.3 14.0 12.9* 10.8*  HCT 45.3 41.1 39.2 32.7*  MCV 85.8 83.5 87.7 87.2  PLT 314 316 282 207   Recent Labs  Lab 08/13/20 0958 08/14/20 0505 08/15/20 0247 08/16/20 0357  NA 137 141 147* 141  K 3.8 3.1* 3.1* 3.0*  CL 90* 85* 95* 98  CO2 25 36* 37* 33*  GLUCOSE 352* 198* 130* 106*  BUN 33* 52* 59* 24*  CREATININE 2.30* 4.59* 2.97* 1.35*  CALCIUM 11.1* 10.8* 9.4 8.8*  MG  --  1.6*  --  1.9  PHOS  --  4.5  --   --     F/u labs ordered.  Signed, Terrilee Croak, MD [facility] 08/16/2020

## 2020-08-16 NOTE — Care Management Important Message (Signed)
Important Message  Patient Details  Name: Jeremiah Bowman MRN: [id number] Date of Birth: September 11, 1956   Medicare Important Message Given:  Yes     Jacquline Terrill [address] 08/16/2020, 3:37 PM

## 2020-08-16 NOTE — Progress Notes (Signed)
CRITICAL VALUE ALERT  Critical Value:  Venous Blood Gas Pa02= 31.3  Date & Time Notied:  08/16/20 0429  Provider Notified: M. Denny,NP  Orders Received/Actions taken: no new orders at time

## 2020-08-16 NOTE — Progress Notes (Signed)
Pt extremely sleepy this am but did received Thorazine last night for hiccups. Arousable to name but then falls back to sleep. K 3.0, orders being placed for supplementation and to recheck Mag.

## 2020-08-16 NOTE — Progress Notes (Signed)
Subjective/Chief Complaint: Denies nausea or emesis. Denies abdominal pain. BMx2 yesterday and some flatus. Also reports hiccups and belching.   Objective: Vital signs in last 24 hours: Temp:  [98.2 F (36.8 C)-100.2 F (37.9 C)] 100.2 F (37.9 C) (10/01 0853) Pulse Rate:  [69-82] 70 (10/01 0853) Resp:  [16-18] 18 (10/01 0853) BP: (118-131)/(74-90) 131/90 (10/01 0853) SpO2:  [97 %-98 %] 98 % (10/01 0853) Last BM Date: 08/15/20  Intake/Output from previous day: 09/30 0701 - 10/01 0700 In: 2228 [P.O.:240; I.V.:1981.5; IV Piggyback:6.5] Out: 1400 [Urine:1400] Intake/Output this shift: No intake/output data recorded.  GI: soft nontender bs present, some distention  Lab Results:  Recent Labs    08/15/20 0247 08/16/20 0357  WBC 10.1 8.0  HGB 12.9* 10.8*  HCT 39.2 32.7*  PLT 282 207   BMET Recent Labs    08/15/20 0247 08/16/20 0357  NA 147* 141  K 3.1* 3.0*  CL 95* 98  CO2 37* 33*  GLUCOSE 130* 106*  BUN 59* 24*  CREATININE 2.97* 1.35*  CALCIUM 9.4 8.8*   PT/INR No results for input(s): LABPROT, INR in the last 72 hours. ABG Recent Labs    08/16/20 0357  HCO3 35.2*    Studies/Results: US RENAL  Result Date: 08/15/2020 CLINICAL DATA:  Acute renal injury EXAM: RENAL / URINARY TRACT ULTRASOUND COMPLETE COMPARISON:  None. FINDINGS: Right Kidney: Renal measurements: 12.2 x 5.8 x 5.4 cm. = volume: 200 mL. No mass lesion or hydronephrosis is noted. Minimal perinephric fluid is noted of uncertain significance. Left Kidney: Renal measurements: 12.8 x 6.5 x 6.9 cm. = volume: 298 mL. Echogenicity within normal limits. No mass or hydronephrosis visualized. Bladder: Appears normal for degree of bladder distention. Other: None. IMPRESSION: Minimal perinephric fluid on the right. This is of uncertain significance. No mass lesion or hydronephrosis is noted. Electronically Signed   By: Inez Catalina M.D.   On: 08/15/2020 10:36   DG Abd Portable 1V  Result Date:  08/16/2020 CLINICAL DATA:  Abdominal pain. EXAM: PORTABLE ABDOMEN - 1 VIEW COMPARISON:  August 14, 2020. FINDINGS: Dilated small bowel loops are noted in the epigastric region concerning for focal ileus or possibly distal small bowel obstruction. Residual contrast is seen throughout the colon. No colonic dilatation is noted. Phleboliths are noted in the pelvis. IMPRESSION: Dilated small bowel loops are noted in the epigastric region concerning for focal ileus or possibly distal small bowel obstruction. Electronically Signed   By: Marijo Conception M.D.   On: 08/16/2020 09:35   DG Abd Portable 1V  Result Date: 08/14/2020 CLINICAL DATA:  Small bowel obstruction EXAM: PORTABLE ABDOMEN - 1 VIEW COMPARISON:  08/14/2020 FINDINGS: There is an enteric tube in the left upper quadrant consistent with tip location in the upper stomach. There is a small amount of contrast material in the stomach. No contrast material is demonstrated in the remainder of the bowel. There is a markedly dilated small bowel loop in the mid abdomen. This is likely small bowel obstruction. Paucity of gas in the colon. IMPRESSION: 1. Enteric tube tip is in the upper stomach. 2. Dilated small bowel loop in the mid abdomen, likely small bowel obstruction. 3. Contrast material seen only in the stomach. Electronically Signed   By: Lucienne Capers M.D.   On: 08/14/2020 22:12   DG Abd Portable 1V  Result Date: 08/14/2020 CLINICAL DATA:  Nasogastric tube placement. EXAM: PORTABLE ABDOMEN - 1 VIEW COMPARISON:  October 19, 2017. FINDINGS: The bowel gas pattern is normal.  Distal tip of nasogastric tube is seen in expected position of the distal stomach. No radio-opaque calculi or other significant radiographic abnormality are seen. IMPRESSION: Distal tip of nasogastric tube seen in expected position of the distal stomach. Electronically Signed   By: Marijo Conception M.D.   On: 08/14/2020 13:52    Anti-infectives: Anti-infectives (From admission,  onward)   None      Assessment/Plan: SBO - No indication for emergency surgery - tolerating clears and had two BMs yesterday. Having some flatus but also belching and hiccups. KUB with contrast in the colon and slight improvement in small bowel dilation - still significantly dilated loop of bowel in epigastric region.  -  - Mobilize  - Unknown etiology of the patients recurrent sbo's.He has been seen by our service for multiple SBO's in the past are all managed nonoperatively (10/24/2015, 05/13/2017, and 10/18/2017). He has a virgin abdomen with no prior abdominal surgeries. He underwent capsule endoscopy 01/31/18 that did not show cause of SBO's. He underwent colonoscopy 03/01/2018 by Dr. Ardis Hughs that showed two polyps but was otherwise negative.  FEN - npo after mn VTE - SCDs,SQ Heparin ID - None   Jill Alexanders 08/16/2020   Addendum: I saw patient once he was awake, likely somnolence related to thorazine.  He is having some bowel function but having burping, hiccups.  His xray still shows what looks like psbo. Without prior surgery and having this several times I recommended dx lsc in am with possible elap or sbr.  We discussed surgery indications, risks and will proceed in am

## 2020-08-16 NOTE — Progress Notes (Signed)
Pt is much more awake and alert, had BM, walking in room and sat up in chair.

## 2020-08-17 ENCOUNTER — Inpatient Hospital Stay (HOSPITAL_COMMUNITY): Payer: Medicare HMO | Admitting: Anesthesiology

## 2020-08-17 ENCOUNTER — Encounter (HOSPITAL_COMMUNITY): Admission: EM | Disposition: A | Discharge status: Home Health | Payer: Self-pay | Source: Home / Self Care

## 2020-08-17 ENCOUNTER — Inpatient Hospital Stay (HOSPITAL_COMMUNITY): Payer: Medicare HMO

## 2020-08-17 ENCOUNTER — Encounter (HOSPITAL_COMMUNITY): Payer: Self-pay | Admitting: Internal Medicine

## 2020-08-17 HISTORY — PX: LYSIS OF ADHESION: SHX5961

## 2020-08-17 HISTORY — PX: LAPAROSCOPY: SHX197

## 2020-08-17 HISTORY — PX: BOWEL RESECTION: SHX1257

## 2020-08-17 HISTORY — PX: LAPAROTOMY: SHX154

## 2020-08-17 LAB — BASIC METABOLIC PANEL
Anion gap: 9 (ref 5–15)
BUN: 10 mg/dL (ref 8–23)
CO2: 23 mmol/L (ref 22–32)
Calcium: 8.6 mg/dL — ABNORMAL LOW (ref 8.9–10.3)
Chloride: 103 mmol/L (ref 98–111)
Creatinine, Ser: 1.08 mg/dL (ref 0.61–1.24)
GFR calc Af Amer: >60 mL/min (ref >60)
GFR calc non Af Amer: >60 mL/min (ref >60)
Glucose, Bld: 105 mg/dL — ABNORMAL HIGH (ref 70–99)
Potassium: 3.3 mmol/L — ABNORMAL LOW (ref 3.5–5.1)
Sodium: 135 mmol/L (ref 135–145)

## 2020-08-17 LAB — CBC
HCT: 33.8 % — ABNORMAL LOW (ref 39.0–52.0)
Hemoglobin: 11.5 g/dL — ABNORMAL LOW (ref 13.0–17.0)
MCH: 29.4 pg (ref 26.0–34.0)
MCHC: 34 g/dL (ref 30.0–36.0)
MCV: 86.4 fL (ref 80.0–100.0)
Platelets: 220 10*3/uL (ref 150–400)
RBC: 3.91 MIL/uL — ABNORMAL LOW (ref 4.22–5.81)
RDW: 12.6 % (ref 11.5–15.5)
WBC: 7.4 10*3/uL (ref 4.0–10.5)
nRBC: 0 % (ref 0.0–0.2)

## 2020-08-17 LAB — GLUCOSE, CAPILLARY
Glucose-Capillary: 102 mg/dL — ABNORMAL HIGH (ref 70–99)
Glucose-Capillary: 101 mg/dL — ABNORMAL HIGH (ref 70–99)
Glucose-Capillary: 123 mg/dL — ABNORMAL HIGH (ref 70–99)
Glucose-Capillary: 139 mg/dL — ABNORMAL HIGH (ref 70–99)

## 2020-08-17 LAB — TYPE AND SCREEN
ABO/RH(D): A POS
Antibody Screen: NEGATIVE

## 2020-08-17 SURGERY — LAPAROSCOPY, DIAGNOSTIC
Anesthesia: General | Site: Abdomen

## 2020-08-17 MED ORDER — DEXAMETHASONE SODIUM PHOSPHATE 10 MG/ML IJ SOLN
INTRAMUSCULAR | Status: AC
  Filled 2020-08-17: qty 1

## 2020-08-17 MED ORDER — MIDAZOLAM HCL 2 MG/2ML IJ SOLN
INTRAMUSCULAR | Status: AC
  Filled 2020-08-17: qty 2

## 2020-08-17 MED ORDER — FENTANYL CITRATE (PF) 250 MCG/5ML IJ SOLN
INTRAMUSCULAR | Status: DC | PRN
Start: 2020-08-17 — End: 2020-08-17
  Administered 2020-08-17: 50 ug via INTRAVENOUS
  Administered 2020-08-17: 100 ug via INTRAVENOUS
  Administered 2020-08-17 (×2): 50 ug via INTRAVENOUS

## 2020-08-17 MED ORDER — LACTATED RINGERS IV SOLN: INTRAVENOUS | Status: DC

## 2020-08-17 MED ORDER — ONDANSETRON HCL 4 MG/2ML IJ SOLN
INTRAMUSCULAR | Status: AC
  Filled 2020-08-17: qty 2

## 2020-08-17 MED ORDER — CHLORHEXIDINE GLUCONATE 0.12 % MT SOLN
OROMUCOSAL | Status: AC
  Administered 2020-08-17: 15 mL via OROMUCOSAL
  Filled 2020-08-17: qty 15

## 2020-08-17 MED ORDER — LIDOCAINE 2% (20 MG/ML) 5 ML SYRINGE
INTRAMUSCULAR | Status: AC
  Filled 2020-08-17: qty 5

## 2020-08-17 MED ORDER — MORPHINE SULFATE (PF) 2 MG/ML IV SOLN
2.0000 mg | INTRAVENOUS | Status: DC | PRN
  Administered 2020-08-17 – 2020-08-21 (×15): 2 mg via INTRAVENOUS
  Filled 2020-08-17 (×15): qty 1

## 2020-08-17 MED ORDER — BUPIVACAINE HCL (PF) 0.25 % IJ SOLN
INTRAMUSCULAR | Status: AC
  Filled 2020-08-17: qty 30

## 2020-08-17 MED ORDER — MEPERIDINE HCL 25 MG/ML IJ SOLN: 6.2500 mg | INTRAMUSCULAR | Status: DC | PRN

## 2020-08-17 MED ORDER — FENTANYL CITRATE (PF) 250 MCG/5ML IJ SOLN
INTRAMUSCULAR | Status: AC
  Filled 2020-08-17: qty 5

## 2020-08-17 MED ORDER — METHOCARBAMOL 1000 MG/10ML IJ SOLN
500.0000 mg | Freq: Four times a day (QID) | INTRAVENOUS | Status: DC | PRN
  Filled 2020-08-17: qty 5

## 2020-08-17 MED ORDER — HYDROMORPHONE HCL 1 MG/ML IJ SOLN
INTRAMUSCULAR | Status: AC
Start: 2020-08-17 — End: 2020-08-18
  Filled 2020-08-17: qty 1

## 2020-08-17 MED ORDER — CHLORHEXIDINE GLUCONATE CLOTH 2 % EX PADS
6.0000 | MEDICATED_PAD | Freq: Every day | CUTANEOUS | Status: AC
  Administered 2020-08-18 – 2020-08-21 (×4): 6 via TOPICAL

## 2020-08-17 MED ORDER — ACETAMINOPHEN 160 MG/5ML PO SOLN: 325.0000 mg | Freq: Once | ORAL | Status: DC | PRN

## 2020-08-17 MED ORDER — ROCURONIUM BROMIDE 10 MG/ML (PF) SYRINGE
PREFILLED_SYRINGE | INTRAVENOUS | Status: AC
  Filled 2020-08-17: qty 10

## 2020-08-17 MED ORDER — PROPOFOL 10 MG/ML IV BOLUS
INTRAVENOUS | Status: DC | PRN
  Administered 2020-08-17: 150 mg via INTRAVENOUS
  Administered 2020-08-17: 50 mg via INTRAVENOUS

## 2020-08-17 MED ORDER — ACETAMINOPHEN 325 MG PO TABS: 325.0000 mg | ORAL_TABLET | Freq: Once | ORAL | Status: DC | PRN

## 2020-08-17 MED ORDER — ALBUMIN HUMAN 5 % IV SOLN: INTRAVENOUS | Status: DC | PRN

## 2020-08-17 MED ORDER — DEXAMETHASONE SODIUM PHOSPHATE 10 MG/ML IJ SOLN
INTRAMUSCULAR | Status: DC | PRN
  Administered 2020-08-17: 4 mg via INTRAVENOUS

## 2020-08-17 MED ORDER — ROCURONIUM BROMIDE 10 MG/ML (PF) SYRINGE
PREFILLED_SYRINGE | INTRAVENOUS | Status: DC | PRN
  Administered 2020-08-17: 20 mg via INTRAVENOUS
  Administered 2020-08-17: 60 mg via INTRAVENOUS

## 2020-08-17 MED ORDER — ACETAMINOPHEN 10 MG/ML IV SOLN: 1000.0000 mg | Freq: Once | INTRAVENOUS | Status: DC | PRN

## 2020-08-17 MED ORDER — 0.9 % SODIUM CHLORIDE (POUR BTL) OPTIME
TOPICAL | Status: DC | PRN
  Administered 2020-08-17 (×2): 1000 mL

## 2020-08-17 MED ORDER — PHENYLEPHRINE 40 MCG/ML (10ML) SYRINGE FOR IV PUSH (FOR BLOOD PRESSURE SUPPORT)
PREFILLED_SYRINGE | INTRAVENOUS | Status: DC | PRN
  Administered 2020-08-17: 80 ug via INTRAVENOUS

## 2020-08-17 MED ORDER — HYDROMORPHONE HCL 1 MG/ML IJ SOLN
0.2500 mg | INTRAMUSCULAR | Status: DC | PRN
  Administered 2020-08-17 (×2): 0.5 mg via INTRAVENOUS

## 2020-08-17 MED ORDER — SUGAMMADEX SODIUM 200 MG/2ML IV SOLN
INTRAVENOUS | Status: DC | PRN
  Administered 2020-08-17: 200 mg via INTRAVENOUS

## 2020-08-17 MED ORDER — ACETAMINOPHEN 10 MG/ML IV SOLN
1000.0000 mg | Freq: Four times a day (QID) | INTRAVENOUS | Status: AC
  Administered 2020-08-17 – 2020-08-18 (×3): 1000 mg via INTRAVENOUS
  Filled 2020-08-17 (×3): qty 100

## 2020-08-17 MED ORDER — AMISULPRIDE (ANTIEMETIC) 5 MG/2ML IV SOLN: 10.0000 mg | Freq: Once | INTRAVENOUS | Status: DC | PRN

## 2020-08-17 MED ORDER — CHLORHEXIDINE GLUCONATE 0.12 % MT SOLN: 15.0000 mL | Freq: Once | OROMUCOSAL | Status: AC

## 2020-08-17 MED ORDER — ONDANSETRON HCL 4 MG/2ML IJ SOLN
INTRAMUSCULAR | Status: DC | PRN
  Administered 2020-08-17: 4 mg via INTRAVENOUS

## 2020-08-17 MED ORDER — PHENYLEPHRINE 40 MCG/ML (10ML) SYRINGE FOR IV PUSH (FOR BLOOD PRESSURE SUPPORT)
PREFILLED_SYRINGE | INTRAVENOUS | Status: AC
  Filled 2020-08-17: qty 10

## 2020-08-17 MED ORDER — BUPIVACAINE HCL (PF) 0.25 % IJ SOLN
INTRAMUSCULAR | Status: DC | PRN
  Administered 2020-08-17: 7 mL

## 2020-08-17 MED ORDER — MIDAZOLAM HCL 5 MG/5ML IJ SOLN
INTRAMUSCULAR | Status: DC | PRN
  Administered 2020-08-17: 2 mg via INTRAVENOUS

## 2020-08-17 MED ORDER — LIDOCAINE 2% (20 MG/ML) 5 ML SYRINGE
INTRAMUSCULAR | Status: DC | PRN
  Administered 2020-08-17: 40 mg via INTRAVENOUS

## 2020-08-17 MED ORDER — MUPIROCIN 2 % EX OINT
1.0000 "application " | TOPICAL_OINTMENT | Freq: Two times a day (BID) | CUTANEOUS | Status: AC
  Administered 2020-08-17 – 2020-08-21 (×10): 1 via NASAL
  Filled 2020-08-17 (×2): qty 22

## 2020-08-17 SURGICAL SUPPLY — 68 items
ADH SKN CLS APL DERMABOND .7 (GAUZE/BANDAGES/DRESSINGS) ×1
APL PRP STRL LF DISP 70% ISPRP (MISCELLANEOUS) ×1
APPLIER CLIP 5 13 M/L LIGAMAX5 (MISCELLANEOUS)
APR CLP MED LRG 5 ANG JAW (MISCELLANEOUS)
BLADE CLIPPER SURG (BLADE) IMPLANT
CANISTER SUCT 3000ML PPV (MISCELLANEOUS) ×3 IMPLANT
CELLS DAT CNTRL 66122 CELL SVR (MISCELLANEOUS) ×1 IMPLANT
CHLORAPREP W/TINT 26 (MISCELLANEOUS) ×3 IMPLANT
CLIP APPLIE 5 13 M/L LIGAMAX5 (MISCELLANEOUS) IMPLANT
COVER SURGICAL LIGHT HANDLE (MISCELLANEOUS) ×3 IMPLANT
COVER WAND RF STERILE (DRAPES) ×1 IMPLANT
DERMABOND ADVANCED (GAUZE/BANDAGES/DRESSINGS) ×2
DERMABOND ADVANCED .7 DNX12 (GAUZE/BANDAGES/DRESSINGS) ×1 IMPLANT
DRAPE LAPAROSCOPIC ABDOMINAL (DRAPES) ×1 IMPLANT
DRAPE WARM FLUID 44X44 (DRAPES) ×3 IMPLANT
DRSG OPSITE POSTOP 4X10 (GAUZE/BANDAGES/DRESSINGS) IMPLANT
DRSG OPSITE POSTOP 4X8 (GAUZE/BANDAGES/DRESSINGS) ×2 IMPLANT
ELECT BLADE 6.5 EXT (BLADE) IMPLANT
ELECT CAUTERY BLADE 6.4 (BLADE) IMPLANT
ELECT REM PT RETURN 9FT ADLT (ELECTROSURGICAL) ×3
ELECTRODE REM PT RTRN 9FT ADLT (ELECTROSURGICAL) ×1 IMPLANT
GLOVE BIO SURGEON STRL SZ7 (GLOVE) ×3 IMPLANT
GLOVE BIOGEL PI IND STRL 7.5 (GLOVE) ×1 IMPLANT
GLOVE BIOGEL PI INDICATOR 7.5 (GLOVE) ×2
GOWN STRL REUS W/ TWL LRG LVL3 (GOWN DISPOSABLE) ×3 IMPLANT
GOWN STRL REUS W/TWL LRG LVL3 (GOWN DISPOSABLE) ×9
HANDLE SUCTION POOLE (INSTRUMENTS) ×1 IMPLANT
KIT BASIN OR (CUSTOM PROCEDURE TRAY) ×3 IMPLANT
KIT TURNOVER KIT B (KITS) ×3 IMPLANT
LIGASURE IMPACT 36 18CM CVD LR (INSTRUMENTS) ×2 IMPLANT
NS IRRIG 1000ML POUR BTL (IV SOLUTION) ×6 IMPLANT
PACK GENERAL/GYN (CUSTOM PROCEDURE TRAY) ×1 IMPLANT
PAD ARMBOARD 7.5X6 YLW CONV (MISCELLANEOUS) ×3 IMPLANT
PENCIL SMOKE EVACUATOR (MISCELLANEOUS) ×3 IMPLANT
RELOAD PROXIMATE 75MM BLUE (ENDOMECHANICALS) ×12 IMPLANT
RELOAD STAPLE 75 3.8 BLU REG (ENDOMECHANICALS) IMPLANT
RETRACTOR WND ALEXIS 18 MED (MISCELLANEOUS) IMPLANT
RTRCTR WOUND ALEXIS 18CM MED (MISCELLANEOUS) ×3
SCISSORS LAP 5X35 DISP (ENDOMECHANICALS) ×2 IMPLANT
SET IRRIG TUBING LAPAROSCOPIC (IRRIGATION / IRRIGATOR) IMPLANT
SET TUBE SMOKE EVAC HIGH FLOW (TUBING) ×3 IMPLANT
SLEEVE ENDOPATH XCEL 5M (ENDOMECHANICALS) ×7 IMPLANT
SPECIMEN JAR LARGE (MISCELLANEOUS) IMPLANT
SPONGE LAP 18X18 RF (DISPOSABLE) IMPLANT
STAPLER GUN LINEAR PROX 60 (STAPLE) ×2 IMPLANT
STAPLER PROXIMATE 75MM BLUE (STAPLE) ×2 IMPLANT
STAPLER VISISTAT 35W (STAPLE) ×3 IMPLANT
SUCTION POOLE HANDLE (INSTRUMENTS)
SUT PDS AB 1 TP1 96 (SUTURE) ×6 IMPLANT
SUT SILK 2 0 (SUTURE) ×3
SUT SILK 2 0 SH CR/8 (SUTURE) ×3 IMPLANT
SUT SILK 2-0 18XBRD TIE 12 (SUTURE) ×1 IMPLANT
SUT SILK 3 0 (SUTURE) ×3
SUT SILK 3 0 SH CR/8 (SUTURE) ×3 IMPLANT
SUT SILK 3-0 18XBRD TIE 12 (SUTURE) ×1 IMPLANT
SUT VIC AB 3-0 SH 27 (SUTURE)
SUT VIC AB 3-0 SH 27X BRD (SUTURE) IMPLANT
TOWEL GREEN STERILE (TOWEL DISPOSABLE) ×3 IMPLANT
TOWEL GREEN STERILE FF (TOWEL DISPOSABLE) ×3 IMPLANT
TRAY FOLEY MTR SLVR 14FR STAT (SET/KITS/TRAYS/PACK) IMPLANT
TRAY FOLEY MTR SLVR 16FR STAT (SET/KITS/TRAYS/PACK) ×3 IMPLANT
TRAY LAPAROSCOPIC MC (CUSTOM PROCEDURE TRAY) ×3 IMPLANT
TROCAR XCEL BLUNT TIP 100MML (ENDOMECHANICALS) ×2 IMPLANT
TROCAR XCEL NON-BLD 11X100MML (ENDOMECHANICALS) IMPLANT
TROCAR XCEL NON-BLD 5MMX100MML (ENDOMECHANICALS) ×3 IMPLANT
TUBE CONNECTING 12'X1/4 (SUCTIONS) ×1
TUBE CONNECTING 12X1/4 (SUCTIONS) ×1 IMPLANT
YANKAUER SUCT BULB TIP NO VENT (SUCTIONS) IMPLANT

## 2020-08-17 NOTE — Anesthesia Procedure Notes (Signed)
Procedure Name: Intubation Date/Time: 08/17/2020 9:40 AM Performed by: Colin Benton, CRNA Pre-anesthesia Checklist: Patient identified, Emergency Drugs available, Suction available and Patient being monitored Patient Re-evaluated:Patient Re-evaluated prior to induction Oxygen Delivery Method: Circle system utilized Preoxygenation: Pre-oxygenation with 100% oxygen Induction Type: IV induction Ventilation: Mask ventilation without difficulty and Oral airway inserted - appropriate to patient size Laryngoscope Size: Glidescope and 4 Grade View: Grade II Tube type: Oral Tube size: 7.5 mm Number of attempts: 3 Airway Equipment and Method: Video-laryngoscopy and Rigid stylet Placement Confirmation: ETT inserted through vocal cords under direct vision,  positive ETCO2 and breath sounds checked- equal and bilateral Secured at: 22 cm Tube secured with: Tape Dental Injury: Teeth and Oropharynx as per pre-operative assessment  Comments: DL x 1 with MAC 4.  Grade 3 view.  DL x 2 with MAC 4 by Dr. Nija Koopman Robert and view remains the same.  Decision to proceed to glidescope.  DL with glidescope 4 by Dr. Jaylenn Baiza Robert.  Grade 2 view.  EBBS.  Difficult IV placement in pre-op.  Questionable IV placed during induction with questionable administration of rocuronium.

## 2020-08-17 NOTE — Anesthesia Preprocedure Evaluation (Addendum)
Anesthesia Evaluation  Patient identified by MRN, date of birth, ID band Patient awake    Reviewed: Allergy & Precautions, NPO status , Patient's Chart, lab work & pertinent test results  Airway Mallampati: III  TM Distance: >3 FB Neck ROM: Full    Dental  (+) Teeth Intact, Dental Advisory Given   Pulmonary former smoker,    breath sounds clear to auscultation       Cardiovascular hypertension, Pt. on medications  Rhythm:Regular Rate:Normal     Neuro/Psych PSYCHIATRIC DISORDERS negative neurological ROS     GI/Hepatic Neg liver ROS, GERD  Medicated,  Endo/Other  diabetes, Type 2, Oral Hypoglycemic Agents  Renal/GU Renal InsufficiencyRenal disease     Musculoskeletal   Abdominal Normal abdominal exam  (+)   Peds  Hematology   Anesthesia Other Findings   Reproductive/Obstetrics                            Anesthesia Physical Anesthesia Plan  ASA: II  Anesthesia Plan: General   Post-op Pain Management:    Induction: Intravenous  PONV Risk Score and Plan: 3 and Ondansetron, Dexamethasone and Midazolam  Airway Management Planned: Oral ETT  Additional Equipment: None  Intra-op Plan:   Post-operative Plan: Extubation in OR  Informed Consent: I have reviewed the patients History and Physical, chart, labs and discussed the procedure including the risks, benefits and alternatives for the proposed anesthesia with the patient or authorized representative who has indicated his/her understanding and acceptance.     Dental advisory given  Plan Discussed with: CRNA  Anesthesia Plan Comments: (Covid-19 Nucleic Acid Test Results Lab Results      Component                Value               Date                      [place]              NEGATIVE            08/14/2020            Lab Results      Component                Value               Date                      WBC                       7.4                 08/17/2020                HGB                      11.5 (L)            08/17/2020                HCT                      33.8 (L)            08/17/2020  MCV                      86.4                08/17/2020                PLT                      220                 08/17/2020           )       Anesthesia Quick Evaluation

## 2020-08-17 NOTE — Transfer of Care (Signed)
Immediate Anesthesia Transfer of Care Note  Patient: Jeremiah Bowman  Procedure(s) Performed: LAPAROSCOPY DIAGNOSTIC (N/A Abdomen) EXPLORATORY LAPAROTOMY (N/A Abdomen) LYSIS OF ADHESION (N/A Abdomen) SMALL BOWEL RESECTION (N/A Abdomen)  Patient Location: PACU  Anesthesia Type:General  Level of Consciousness: drowsy and patient cooperative  Airway & Oxygen Therapy: Patient Spontanous Breathing and Patient connected to face mask oxygen  Post-op Assessment: Report given to RN and Post -op Vital signs reviewed and stable  Post vital signs: Reviewed and stable  Last Vitals:  Vitals Value Taken Time  BP 159/102 08/17/20 1133  Temp    Pulse 77 08/17/20 1138  Resp 18 08/17/20 1138  SpO2 100 % 08/17/20 1138  Vitals shown include unvalidated device data.  Last Pain:  Vitals:   08/17/20 0348  TempSrc: Oral  PainSc:          Complications: No complications documented.

## 2020-08-17 NOTE — Anesthesia Postprocedure Evaluation (Signed)
Anesthesia Post Note  Patient: Jeremiah Bowman  Procedure(s) Performed: LAPAROSCOPY DIAGNOSTIC (N/A Abdomen) EXPLORATORY LAPAROTOMY (N/A Abdomen) LYSIS OF ADHESION (N/A Abdomen) SMALL BOWEL RESECTION (N/A Abdomen)     Patient location during evaluation: PACU Anesthesia Type: General Level of consciousness: awake and alert Pain management: pain level controlled Vital Signs Assessment: post-procedure vital signs reviewed and stable Respiratory status: spontaneous breathing, nonlabored ventilation, respiratory function stable and patient connected to nasal cannula oxygen Cardiovascular status: blood pressure returned to baseline and stable Postop Assessment: no apparent nausea or vomiting Anesthetic complications: no   No complications documented.  Last Vitals:  Vitals:   08/17/20 1301 08/17/20 1345  BP: (!) 155/94 (!) 166/98  Pulse: 81 90  Resp: 17 17  Temp: 36.6 C 36.9 C  SpO2: 97% 97%    Last Pain:  Vitals:   08/17/20 1419  TempSrc:   PainSc: [place]

## 2020-08-17 NOTE — Progress Notes (Signed)
Day of Surgery   Subjective/Chief Complaint: Feels well today, drank some yesterday   Objective: Vital signs in last 24 hours: Temp:  [98.7 F (37.1 C)-100.2 F (37.9 C)] 98.9 F (37.2 C) (10/02 0348) Pulse Rate:  [65-83] 65 (10/02 0348) Resp:  [17-18] 18 (10/02 0348) BP: (119-136)/(89-97) 136/89 (10/02 0348) SpO2:  [98 %-100 %] 100 % (10/02 0348) Last BM Date: 08/16/20  Intake/Output from previous day: 10/01 0701 - 10/02 0700 In: 3076.6 [P.O.:1280; I.V.:1408.7; IV Piggyback:387.9] Out: 1301 [Urine:1300; Stool:1] Intake/Output this shift: No intake/output data recorded.  GI: soft nontender mild distended  Lab Results:  Recent Labs    08/16/20 0357 08/17/20 0224  WBC 8.0 7.4  HGB 10.8* 11.5*  HCT 32.7* 33.8*  PLT 207 220   BMET Recent Labs    08/16/20 0357 08/17/20 0224  NA 141 135  K 3.0* 3.3*  CL 98 103  CO2 33* 23  GLUCOSE 106* 105*  BUN 24* 10  CREATININE 1.35* 1.08  CALCIUM 8.8* 8.6*   PT/INR No results for input(s): LABPROT, INR in the last 72 hours. ABG Recent Labs    08/16/20 0357  HCO3 35.2*    Studies/Results: US RENAL  Result Date: 08/15/2020 CLINICAL DATA:  Acute renal injury EXAM: RENAL / URINARY TRACT ULTRASOUND COMPLETE COMPARISON:  None. FINDINGS: Right Kidney: Renal measurements: 12.2 x 5.8 x 5.4 cm. = volume: 200 mL. No mass lesion or hydronephrosis is noted. Minimal perinephric fluid is noted of uncertain significance. Left Kidney: Renal measurements: 12.8 x 6.5 x 6.9 cm. = volume: 298 mL. Echogenicity within normal limits. No mass or hydronephrosis visualized. Bladder: Appears normal for degree of bladder distention. Other: None. IMPRESSION: Minimal perinephric fluid on the right. This is of uncertain significance. No mass lesion or hydronephrosis is noted. Electronically Signed   By: Inez Catalina M.D.   On: 08/15/2020 10:36   DG Abd Portable 1V  Result Date: 08/16/2020 CLINICAL DATA:  Abdominal pain. EXAM: PORTABLE ABDOMEN - 1  VIEW COMPARISON:  August 14, 2020. FINDINGS: Dilated small bowel loops are noted in the epigastric region concerning for focal ileus or possibly distal small bowel obstruction. Residual contrast is seen throughout the colon. No colonic dilatation is noted. Phleboliths are noted in the pelvis. IMPRESSION: Dilated small bowel loops are noted in the epigastric region concerning for focal ileus or possibly distal small bowel obstruction. Electronically Signed   By: Marijo Conception M.D.   On: 08/16/2020 09:35    Anti-infectives: Anti-infectives (From admission, onward)   Start     Dose/Rate Route Frequency Ordered Stop   08/17/20 0600  ceFAZolin (ANCEF) IVPB 2g/100 mL premix        2 g 200 mL/hr over 30 Minutes Intravenous On call to O.R. 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 He underwent capsule endoscopy 01/31/18 that did not show cause of SBO's. He underwent colonoscopy 03/01/2018 by Dr. Ardis Hughs that showed two polyps but was otherwise negative.  AKI- resolved FEN -npo  VTE -SCDs,SQ Heparin ID -None  Rolm Bookbinder 08/17/2020

## 2020-08-17 NOTE — Anesthesia Procedure Notes (Signed)
Central Venous Catheter Insertion Performed by: Effie Berkshire, MD, anesthesiologist Start/End10/12/2019 9:55 AM, 08/17/2020 10:05 AM Patient location: Pre-op. Preanesthetic checklist: patient identified, IV checked, site marked, risks and benefits discussed, surgical consent, monitors and equipment checked, pre-op evaluation, timeout performed and anesthesia consent Position: Trendelenburg Lidocaine 1% used for infiltration and patient sedated Hand hygiene performed , maximum sterile barriers used  and Seldinger technique used Catheter size: 8 Fr Total catheter length 16. Central line was placed.Double lumen Procedure performed using ultrasound guided technique. Ultrasound Notes:anatomy identified, needle tip was noted to be adjacent to the nerve/plexus identified, no ultrasound evidence of intravascular and/or intraneural injection and image(s) printed for medical record Attempts: 1 Following insertion, dressing applied, line sutured and Biopatch. Post procedure assessment: blood return through all ports  Patient tolerated the procedure well with no immediate complications.

## 2020-08-17 NOTE — Op Note (Addendum)
Preoperative diagnosis: recurrent psbo Postoperative diagnosis: same as above Procedure:  1. Diagnostic laparoscopy with lysis of adhesions 2. Laparotomy with lysis of adhesions 3. Small bowel resection Surgeon: Dr Serita Grammes Anesthesia: general EBL: 50 cc Complications none Drains none Specimens: small bowel to pathology Sponge and needle count correct dispo recovery stable  Indications: this is a 56 yom who presents with recurrent small bowel obstruction without prior surgery. He has prior evaluation with negative upper and lower endoscopy as well as pill cam.  He was admitted again with sbo.  This is psbo but has not improved completely and I discussed operative intervention with him  Procedure: After informed consent obtained patient was taken to the OR.  He was given antibiotics and SCDs were in place. He was placed under general anesthesia. Dr Smith Robert placed a central line due to poor access.  A foley and ng tube were placed.he was prepped and draped in standard sterile surgical fashion. Surgical timeout was performed.  I infiltrated marcaine in the supraumbilical position. I then made an incision.  I then grasped the fascia with Coker clamps.  I entered this sharply.  I entered the peritoneum bluntly.  I placed a 0 Vicryl pursestring suture through the fascia and introduced a Hassan trocar.  I insufflated the abdomen to 15 mmHg pressure.  I then inserted what eventually were 4 5 mm trochars with 2 on the right and 2 on the left under direct vision without complication.  His terminal ileum was stuck to the retroperitoneum and I used scissors to release this.  This was clearly once I released it not where the problem was.  I then ran the small bowel back from the terminal ileum and once I got more to the proximal to mid jejunum there was a an area of small bowel that was very dilated with multiple interloop adhesions.  Small bowel exiting this was decompressed.  This clearly where the  issue was.  Due to my concern for another process I elected to make an incision.  I then remove the laparoscopic equipment and did a laparotomy.  I entered into the abdomen without difficulty.  I placed a wound protector.  I then eviscerated him.  There is no injury from the laparoscopic portion of the procedure.  The area of small bowel with all the interloop adhesions was really impossible to take down due to the fact that the bowel was opposed to bowel.  There was no plane present.  I elected to resect this portion of bowel.  Proximal to this I divided with the GIA stapler.  I divided this distally at normal bowel.  I use a combination of the LigaSure device and sutures to then divide the mesentery.  This was then passed off as a specimen.  I approximated the small bowel to itself with 3-0 silk sutures.  I then made enterotomies in both limbs.  I introduced the GIA stapler and created an anastomosis.  I used the [place] stapler to then closed the common enterotomy.  I then closed the mesenteric defect with 2-0 silk suture.  The anastomosis was viable and patent.  I then placed this back in the abdomen.  Hemostasis was observed.  I then confirmed placement of the NG tube in the stomach.  I then placed the omentum overlying the viscera.  I closed the abdomen with #1 looped PDS.  Staples were used to close the incisions.  He tolerated this well was extubated and transferred to recovery stable.  I tried to contact his wife postop but there was no answer at phone number in chart (Im not sure it is correct)

## 2020-08-18 ENCOUNTER — Encounter (HOSPITAL_COMMUNITY): Payer: Self-pay | Admitting: General Surgery

## 2020-08-18 LAB — GLUCOSE, CAPILLARY
Glucose-Capillary: 102 mg/dL — ABNORMAL HIGH (ref 70–99)
Glucose-Capillary: 127 mg/dL — ABNORMAL HIGH (ref 70–99)
Glucose-Capillary: 114 mg/dL — ABNORMAL HIGH (ref 70–99)
Glucose-Capillary: 114 mg/dL — ABNORMAL HIGH (ref 70–99)
Glucose-Capillary: 109 mg/dL — ABNORMAL HIGH (ref 70–99)

## 2020-08-18 LAB — CBC
HCT: 32.7 % — ABNORMAL LOW (ref 39.0–52.0)
Hemoglobin: 11.3 g/dL — ABNORMAL LOW (ref 13.0–17.0)
MCH: 29.6 pg (ref 26.0–34.0)
MCHC: 34.6 g/dL (ref 30.0–36.0)
MCV: 85.6 fL (ref 80.0–100.0)
Platelets: 236 10*3/uL (ref 150–400)
RBC: 3.82 MIL/uL — ABNORMAL LOW (ref 4.22–5.81)
RDW: 12.5 % (ref 11.5–15.5)
WBC: 10.8 10*3/uL — ABNORMAL HIGH (ref 4.0–10.5)
nRBC: 0 % (ref 0.0–0.2)

## 2020-08-18 LAB — BASIC METABOLIC PANEL
Anion gap: 11 (ref 5–15)
BUN: 9 mg/dL (ref 8–23)
CO2: 20 mmol/L — ABNORMAL LOW (ref 22–32)
Calcium: 8.4 mg/dL — ABNORMAL LOW (ref 8.9–10.3)
Chloride: 106 mmol/L (ref 98–111)
Creatinine, Ser: 1.09 mg/dL (ref 0.61–1.24)
GFR calc Af Amer: >60 mL/min (ref >60)
GFR calc non Af Amer: >60 mL/min (ref >60)
Glucose, Bld: 105 mg/dL — ABNORMAL HIGH (ref 70–99)
Potassium: 3.6 mmol/L (ref 3.5–5.1)
Sodium: 137 mmol/L (ref 135–145)

## 2020-08-18 MED ORDER — ACETAMINOPHEN 325 MG PO TABS
650.0000 mg | ORAL_TABLET | Freq: Four times a day (QID) | ORAL | Status: DC | PRN
  Administered 2020-08-18: 650 mg via ORAL
  Filled 2020-08-18: qty 2

## 2020-08-18 NOTE — Progress Notes (Signed)
1 Day Post-Op  Subjective: Pain controlled. Denies nausea. Afebrile, vitals stable, excellent UOP. No flatus yet.  Objective: Vital signs in last 24 hours: Temp:  [97.9 F (36.6 C)-98.9 F (37.2 C)] 98.9 F (37.2 C) (10/03 0500) Pulse Rate:  [75-95] 91 (10/03 0500) Resp:  [14-20] 19 (10/03 0500) BP: (139-172)/(82-102) 157/94 (10/03 0500) SpO2:  [93 %-100 %] 99 % (10/03 0350) Last BM Date: 08/16/20  Intake/Output from previous day: 10/02 0701 - 10/03 0700 In: 3173.5 [I.V.:2523.5; IV Piggyback:650] Out: 3225 [Urine:2175; Emesis/NG output:950; Blood:100] Intake/Output this shift: No intake/output data recorded.  PE: General: resting comfortably, NAD Neuro: alert and oriented HEENT: NG in place draining gastric contents Resp: normal work of breathing on room air Abdomen: distended but soft, minimally tender to palpation. Midline incision c/d/i GU: Foley in place draining clear yellow urine  Lab Results:  Recent Labs    08/17/20 0224 08/18/20 0316  WBC 7.4 10.8*  HGB 11.5* 11.3*  HCT 33.8* 32.7*  PLT 220 236   BMET Recent Labs    08/17/20 0224 08/18/20 0316  NA 135 137  K 3.3* 3.6  CL 103 106  CO2 23 20*  GLUCOSE 105* 105*  BUN 10 9  CREATININE 1.08 1.09  CALCIUM 8.6* 8.4*   PT/INR No results for input(s): LABPROT, INR in the last 72 hours. CMP     Component Value Date/Time   NA 137 08/18/2020 0316   K 3.6 08/18/2020 0316   CL 106 08/18/2020 0316   CO2 20 (L) 08/18/2020 0316   GLUCOSE 105 (H) 08/18/2020 0316   BUN 9 08/18/2020 0316   CREATININE 1.09 08/18/2020 0316   CALCIUM 8.4 (L) 08/18/2020 0316   PROT 8.5 (H) 08/13/2020 0958   ALBUMIN 4.3 08/13/2020 0958   AST 32 08/13/2020 0958   ALT 46 (H) 08/13/2020 0958   ALKPHOS 63 08/13/2020 0958   BILITOT 1.2 08/13/2020 0958   GFRNONAA >60 08/18/2020 0316   GFRAA >60 08/18/2020 0316   Lipase     Component Value Date/Time   LIPASE 25 08/13/2020 0958       Studies/Results: DG Chest Port  1 View  Result Date: 08/17/2020 CLINICAL DATA:  Central venous catheter placement. EXAM: PORTABLE CHEST 1 VIEW COMPARISON:  Chest radiograph 05/13/2017 FINDINGS: Cardiomediastinal contours within normal limits for AP technique and low lung volumes. Interval placement of a right central venous catheter with tip projecting at the cavoatrial junction. Interval placement of a nasogastric tube with distal aspect below the diaphragms out of field of view. No focal opacity in the bilateral lungs. No pneumothorax or large pleural effusion. No acute finding in the visualized skeleton. IMPRESSION: 1. Interval placement of right central venous catheter with tip projecting at the cavoatrial junction. 2. Nasogastric tube courses below the diaphragms out of field of view. 3. No acute cardiopulmonary finding. Electronically Signed   By: Audie Pinto M.D.   On: 08/17/2020 14:54   DG Abd Portable 1V  Result Date: 08/16/2020 CLINICAL DATA:  Abdominal pain. EXAM: PORTABLE ABDOMEN - 1 VIEW COMPARISON:  August 14, 2020. FINDINGS: Dilated small bowel loops are noted in the epigastric region concerning for focal ileus or possibly distal small bowel obstruction. Residual contrast is seen throughout the colon. No colonic dilatation is noted. Phleboliths are noted in the pelvis. IMPRESSION: Dilated small bowel loops are noted in the epigastric region concerning for focal ileus or possibly distal small bowel obstruction. Electronically Signed   By: Bobbe Medico.D.  On: 08/16/2020 09:35    Anti-infectives: Anti-infectives (From admission, onward)   Start     Dose/Rate Route Frequency Ordered Stop   08/17/20 0600  ceFAZolin (ANCEF) IVPB 2g/100 mL premix        2 g 200 mL/hr over 30 Minutes Intravenous On call to O.R. 08/16/20 1358 08/17/20 1035       Assessment/Plan  64 yo male with recurrent SBO, POD1 s/p ex lap, lysis of adhesions and small bowel resection. - Remain NPO with NGT to suction. Awaiting return  of bowel function. - PRN morphine and robaxin for pain control - Out of bed, pulmonary toilet - Remove foley, voiding trial - VTE: SCDs, SQH - Dispo: remain inpatient for continued postoperative care   LOS: 4 days   Michaelle Birks, MD [facility] General, Hepatobiliary and Pancreatic Surgery 08/18/20 8:02 AM

## 2020-08-18 NOTE — Progress Notes (Signed)
Pt noted temp 100.5. Encouraged to use IS(can get up to1000). On call Dr. Kieth Brightly notified via text page with new order received Tylenol 650mg  every 6hrs PRN fever over 100 degrees farenheit.

## 2020-08-19 LAB — BASIC METABOLIC PANEL
Anion gap: 15 (ref 5–15)
BUN: 8 mg/dL (ref 8–23)
CO2: 17 mmol/L — ABNORMAL LOW (ref 22–32)
Calcium: 8.6 mg/dL — ABNORMAL LOW (ref 8.9–10.3)
Chloride: 106 mmol/L (ref 98–111)
Creatinine, Ser: 1.2 mg/dL (ref 0.61–1.24)
GFR calc Af Amer: >60 mL/min (ref >60)
GFR calc non Af Amer: >60 mL/min (ref >60)
Glucose, Bld: 108 mg/dL — ABNORMAL HIGH (ref 70–99)
Potassium: 3.1 mmol/L — ABNORMAL LOW (ref 3.5–5.1)
Sodium: 138 mmol/L (ref 135–145)

## 2020-08-19 LAB — GLUCOSE, CAPILLARY
Glucose-Capillary: 108 mg/dL — ABNORMAL HIGH (ref 70–99)
Glucose-Capillary: 98 mg/dL (ref 70–99)
Glucose-Capillary: 107 mg/dL — ABNORMAL HIGH (ref 70–99)
Glucose-Capillary: 110 mg/dL — ABNORMAL HIGH (ref 70–99)
Glucose-Capillary: 116 mg/dL — ABNORMAL HIGH (ref 70–99)
Glucose-Capillary: 123 mg/dL — ABNORMAL HIGH (ref 70–99)
Glucose-Capillary: 153 mg/dL — ABNORMAL HIGH (ref 70–99)
Glucose-Capillary: 97 mg/dL (ref 70–99)
Glucose-Capillary: 113 mg/dL — ABNORMAL HIGH (ref 70–99)

## 2020-08-19 LAB — CBC
HCT: 31 % — ABNORMAL LOW (ref 39.0–52.0)
Hemoglobin: 10.4 g/dL — ABNORMAL LOW (ref 13.0–17.0)
MCH: 28.9 pg (ref 26.0–34.0)
MCHC: 33.5 g/dL (ref 30.0–36.0)
MCV: 86.1 fL (ref 80.0–100.0)
Platelets: 239 10*3/uL (ref 150–400)
RBC: 3.6 MIL/uL — ABNORMAL LOW (ref 4.22–5.81)
RDW: 13 % (ref 11.5–15.5)
WBC: 10.4 10*3/uL (ref 4.0–10.5)
nRBC: 0 % (ref 0.0–0.2)

## 2020-08-19 LAB — MAGNESIUM: Magnesium: 1.5 mg/dL — ABNORMAL LOW (ref 1.7–2.4)

## 2020-08-19 MED ORDER — MAGNESIUM SULFATE 2 GM/50ML IV SOLN
2.0000 g | Freq: Once | INTRAVENOUS | Status: AC
  Administered 2020-08-19: 2 g via INTRAVENOUS
  Filled 2020-08-19: qty 50

## 2020-08-19 MED ORDER — MAGNESIUM SULFATE 50 % IJ SOLN: 2.0000 g | Freq: Once | INTRAMUSCULAR | Status: DC

## 2020-08-19 MED ORDER — POTASSIUM CHLORIDE 10 MEQ/100ML IV SOLN
10.0000 meq | INTRAVENOUS | Status: DC
  Filled 2020-08-19: qty 100

## 2020-08-19 MED ORDER — METOPROLOL TARTRATE 5 MG/5ML IV SOLN
5.0000 mg | Freq: Four times a day (QID) | INTRAVENOUS | Status: DC | PRN
  Administered 2020-08-23: 5 mg via INTRAVENOUS
  Filled 2020-08-19: qty 5

## 2020-08-19 MED ORDER — ACETAMINOPHEN 10 MG/ML IV SOLN
1000.0000 mg | Freq: Four times a day (QID) | INTRAVENOUS | Status: AC
  Administered 2020-08-19 – 2020-08-20 (×3): 1000 mg via INTRAVENOUS
  Filled 2020-08-19 (×3): qty 100

## 2020-08-19 MED ORDER — POTASSIUM CHLORIDE 10 MEQ/50ML IV SOLN
10.0000 meq | INTRAVENOUS | Status: AC
  Administered 2020-08-19 (×4): 10 meq via INTRAVENOUS
  Filled 2020-08-19 (×7): qty 50

## 2020-08-19 NOTE — Progress Notes (Signed)
[facility] Progress Note  2 Days Post-Op  Subjective: CC-  Abdomen feeling a little better. Pain is less. Mild bloating. Denies n/v. No flatus or BM. NG tube with 700cc out last 24 hours.  Low grade temp over night 100.5. Denies dysuria, CP, SOB, cough. States that he cannot cough with NG tube in place.  Objective: Vital signs in last 24 hours: Temp:  [99 F (37.2 C)-100.5 F (38.1 C)] 99.5 F (37.5 C) (10/04 0354) Pulse Rate:  [93-98] 97 (10/04 0354) Resp:  [18] 18 (10/04 0354) BP: (149-170)/(91-95) 152/93 (10/04 0354) SpO2:  [100 %] 100 % (10/04 0354) Last BM Date: 08/16/20  Intake/Output from previous day: 10/03 0701 - 10/04 0700 In: 925.9 [P.O.:30; I.V.:895.9] Out: 2550 [Urine:1850; Emesis/NG output:700] Intake/Output this shift: No intake/output data recorded.  PE: Gen:  Alert, NAD, pleasant HEENT: EOM's intact, pupils equal and round Card:  RRR Pulm:  CTAB, no W/R/R, rate and effort normal Abd: Soft, distended, hypoactive BS, mild diffuse TTP, midline incision cdi with staples intact and honeycomb dressing in place  Lab Results:  Recent Labs    08/17/20 0224 08/18/20 0316  WBC 7.4 10.8*  HGB 11.5* 11.3*  HCT 33.8* 32.7*  PLT 220 236   BMET Recent Labs    08/17/20 0224 08/18/20 0316  NA 135 137  K 3.3* 3.6  CL 103 106  CO2 23 20*  GLUCOSE 105* 105*  BUN 10 9  CREATININE 1.08 1.09  CALCIUM 8.6* 8.4*   PT/INR No results for input(s): LABPROT, INR in the last 72 hours. CMP     Component Value Date/Time   NA 137 08/18/2020 0316   K 3.6 08/18/2020 0316   CL 106 08/18/2020 0316   CO2 20 (L) 08/18/2020 0316   GLUCOSE 105 (H) 08/18/2020 0316   BUN 9 08/18/2020 0316   CREATININE 1.09 08/18/2020 0316   CALCIUM 8.4 (L) 08/18/2020 0316   PROT 8.5 (H) 08/13/2020 0958   ALBUMIN 4.3 08/13/2020 0958   AST 32 08/13/2020 0958   ALT 46 (H) 08/13/2020 0958   ALKPHOS 63 08/13/2020 0958   BILITOT 1.2 08/13/2020 0958   GFRNONAA >60 08/18/2020  0316   GFRAA >60 08/18/2020 0316   Lipase     Component Value Date/Time   LIPASE 25 08/13/2020 0958       Studies/Results: DG Chest Port 1 View  Result Date: 08/17/2020 CLINICAL DATA:  Central venous catheter placement. EXAM: PORTABLE CHEST 1 VIEW COMPARISON:  Chest radiograph 05/13/2017 FINDINGS: Cardiomediastinal contours within normal limits for AP technique and low lung volumes. Interval placement of a right central venous catheter with tip projecting at the cavoatrial junction. Interval placement of a nasogastric tube with distal aspect below the diaphragms out of field of view. No focal opacity in the bilateral lungs. No pneumothorax or large pleural effusion. No acute finding in the visualized skeleton. IMPRESSION: 1. Interval placement of right central venous catheter with tip projecting at the cavoatrial junction. 2. Nasogastric tube courses below the diaphragms out of field of view. 3. No acute cardiopulmonary finding. Electronically Signed   By: Audie Pinto M.D.   On: 08/17/2020 14:54    Anti-infectives: Anti-infectives (From admission, onward)   Start     Dose/Rate Route Frequency Ordered Stop   08/17/20 0600  ceFAZolin (ANCEF) IVPB 2g/100 mL premix        2 g 200 mL/hr over 30 Minutes Intravenous On call to O.R. 08/16/20 1358 08/17/20 1035  Assessment/Plan DM-2 - SSI HTN - home meds, IV metoprolol PRN AKI - resolved  Small bowel obstruction S/p Diagnostic laparoscopy with lysis of adhesions, laparotomy with lysis of adhesions, Small bowel resection 10/2 Dr. Donne Hazel - POD#2 - Continue NPO/NGT and await return in bowel function. Mobilize. Add IV tylenol for pain control and limit morhpine. Will ask PT to see. Labs are pending.  ID - ancef periop FEN - IVF, NPO/NGT to LIWS VTE - SCDs, sq heparin Foley - out 10/3 Follow up - Dr. Donne Hazel   LOS: 5 days    Wellington Hampshire, [facility] 08/19/2020, 8:00 AM Please see Amion for pager  number during day hours 7:00am-4:30pm

## 2020-08-19 NOTE — Social Work (Signed)
CSW rescheduled hospital f/u for 10/19 at 2pm, added/updated to the AVS. Note no further PT recs at this time.   Westley Hummer, MSW, [place] [facility]

## 2020-08-19 NOTE — Evaluation (Signed)
Physical Therapy Evaluation Patient Details Name: Jeremiah Bowman MRN: [id number] DOB: 1956-05-31 Today's Date: 08/19/2020   History of Present Illness  Jeremiah Bowman is a 64 y.o. male with medical history significant of DM2, HTN, prior SBOs of unclear cause. Presents to ED with N/V and diffuse lower abdominal pain. Underwent laparoscopy with small bowel resection on 10/2.  Clinical Impression  Pt admitted with above diagnosis. Pt mobility currently limited mostly by abdominal pain. Ambulated 500' with RW for pain control, supervision given. Pt with decreased pace and HR 115 bpm with O2 sats 97% on RA. Pt has flight of stairs in home, will plan to practice before d/c.  Pt currently with functional limitations due to the deficits listed below (see PT Problem List). Pt will benefit from skilled PT to increase their independence and safety with mobility to allow discharge to the venue listed below.       Follow Up Recommendations No PT follow up    Equipment Recommendations  None recommended by PT    Recommendations for Other Services       Precautions / Restrictions Precautions Precautions: Other (comment) Precaution Comments: NG tube, can be clamped Restrictions Weight Bearing Restrictions: No      Mobility  Bed Mobility               General bed mobility comments: pt received in chair  Transfers Overall transfer level: Needs assistance Equipment used: Rolling walker (2 wheeled) Transfers: Sit to/from Stand Sit to Stand: Supervision         General transfer comment: vc's for hand placement, no LOB  Ambulation/Gait Ambulation/Gait assistance: Supervision Gait Distance (Feet): 500 Feet Assistive device: Rolling walker (2 wheeled) Gait Pattern/deviations: Step-through pattern Gait velocity: decreased Gait velocity interpretation: 1.31 - 2.62 ft/sec, indicative of limited community ambulator General Gait Details: vc's for posture. HR 115 bpm, O2 sats 97%  Stairs             Wheelchair Mobility    Modified Rankin (Stroke Patients Only)       Balance Overall balance assessment: No apparent balance deficits (not formally assessed) (with RW)                                           Pertinent Vitals/Pain Pain Assessment: Faces Faces Pain Scale: Hurts even more Pain Location: abdomen Pain Descriptors / Indicators: Aching;Sore;Guarding;Grimacing Pain Intervention(s): Limited activity within patient's tolerance;Monitored during session    Home Living Family/patient expects to be discharged to:: Private residence Living Arrangements: Spouse/significant other Available Help at Discharge: Family;Available PRN/intermittently Type of Home: House Home Access: Stairs to enter   Entrance Stairs-Number of Steps: 3 Home Layout: Two level;Able to live on main level with bedroom/bathroom Home Equipment: Walker - standard      Prior Function Level of Independence: Independent         Comments: pt works full-time, owns own business (per chart). Wife works there too. He works at [employer]        Extremity/Trunk Assessment   Upper Extremity Assessment Upper Extremity Assessment: Overall WFL for tasks assessed    Lower Extremity Assessment Lower Extremity Assessment: Overall WFL for tasks assessed    Cervical / Trunk Assessment Cervical / Trunk Assessment: Normal  Communication   Communication: No difficulties  Cognition Arousal/Alertness: Awake/alert Behavior During Therapy: WFL for tasks assessed/performed Overall Cognitive Status:  Within Functional Limits for tasks assessed                                        General Comments General comments (skin integrity, edema, etc.): wife present for session. Pt instructed to ambulate 2-3x more today with nsg staff to push pole    Exercises     Assessment/Plan    PT Assessment Patient needs continued PT services  PT Problem List  Pain;Decreased mobility;Decreased knowledge of use of DME       PT Treatment Interventions DME instruction;Gait training;Stair training;Functional mobility training;Therapeutic activities;Therapeutic exercise;Patient/family education    PT Goals (Current goals can be found in the Care Plan section)  Acute Rehab PT Goals Patient Stated Goal: return to home and work PT Goal Formulation: With patient Time For Goal Achievement: 09/02/20 Potential to Achieve Goals: Good    Frequency Min 3X/week   Barriers to discharge   wife can be home with him    Co-evaluation               AM-PAC PT "6 Clicks" Mobility  Outcome Measure Help needed turning from your back to your side while in a flat bed without using bedrails?: A Little Help needed moving from lying on your back to sitting on the side of a flat bed without using bedrails?: A Little Help needed moving to and from a bed to a chair (including a wheelchair)?: A Little Help needed standing up from a chair using your arms (e.g., wheelchair or bedside chair)?: A Little Help needed to walk in hospital room?: A Little Help needed climbing 3-5 steps with a railing? : A Little 6 Click Score: 18    End of Session   Activity Tolerance: Patient tolerated treatment well Patient left: in chair;with call bell/phone within reach;with family/visitor present Nurse Communication: Mobility status PT Visit Diagnosis: Difficulty in walking, not elsewhere classified (R26.2);Pain Pain - part of body:  (abdomen)    Time: 4332-9518 PT Time Calculation (min) (ACUTE ONLY): 26 min   Charges:   PT Evaluation $PT Eval Low Complexity: 1 Low PT Treatments $Gait Training: 8-22 mins        [place]  Pager [phone] Office [place] 08/19/2020, 12:04 PM

## 2020-08-20 ENCOUNTER — Telehealth: Payer: Self-pay

## 2020-08-20 LAB — GLUCOSE, CAPILLARY
Glucose-Capillary: 77 mg/dL (ref 70–99)
Glucose-Capillary: 88 mg/dL (ref 70–99)
Glucose-Capillary: 90 mg/dL (ref 70–99)
Glucose-Capillary: 93 mg/dL (ref 70–99)
Glucose-Capillary: 93 mg/dL (ref 70–99)
Glucose-Capillary: 96 mg/dL (ref 70–99)

## 2020-08-20 LAB — BASIC METABOLIC PANEL
Anion gap: 15 (ref 5–15)
BUN: 7 mg/dL — ABNORMAL LOW (ref 8–23)
CO2: 16 mmol/L — ABNORMAL LOW (ref 22–32)
Calcium: 8.5 mg/dL — ABNORMAL LOW (ref 8.9–10.3)
Chloride: 108 mmol/L (ref 98–111)
Creatinine, Ser: 1.05 mg/dL (ref 0.61–1.24)
GFR calc non Af Amer: >60 mL/min (ref >60)
Glucose, Bld: 105 mg/dL — ABNORMAL HIGH (ref 70–99)
Potassium: 3.1 mmol/L — ABNORMAL LOW (ref 3.5–5.1)
Sodium: 139 mmol/L (ref 135–145)

## 2020-08-20 LAB — SURGICAL PATHOLOGY

## 2020-08-20 LAB — MAGNESIUM: Magnesium: 1.6 mg/dL — ABNORMAL LOW (ref 1.7–2.4)

## 2020-08-20 MED ORDER — POTASSIUM CHLORIDE 10 MEQ/50ML IV SOLN
10.0000 meq | INTRAVENOUS | Status: AC
  Administered 2020-08-20 (×6): 10 meq via INTRAVENOUS
  Filled 2020-08-20 (×6): qty 50

## 2020-08-20 MED ORDER — MAGNESIUM SULFATE 2 GM/50ML IV SOLN
2.0000 g | Freq: Once | INTRAVENOUS | Status: AC
  Administered 2020-08-20: 2 g via INTRAVENOUS
  Filled 2020-08-20: qty 50

## 2020-08-20 MED ORDER — SODIUM CHLORIDE 0.9% FLUSH
10.0000 mL | INTRAVENOUS | Status: DC | PRN
  Administered 2020-08-23: 10 mL

## 2020-08-20 MED ORDER — ACETAMINOPHEN 10 MG/ML IV SOLN
1000.0000 mg | Freq: Four times a day (QID) | INTRAVENOUS | Status: AC
  Administered 2020-08-20 – 2020-08-21 (×4): 1000 mg via INTRAVENOUS
  Filled 2020-08-20 (×4): qty 100

## 2020-08-20 NOTE — Telephone Encounter (Signed)
Jeremiah Banister, MD  Rolm Bookbinder, MD; Timothy Lasso, RN Jeremiah Bowman,  I do remember him. Pretty interesting findings. Not sure what that is but we will contact him to get back in the office to see how he is recovered from surgery and see if there is anything more we need to do for him. Thanks    Jeremiah Bowman,  He needs office visit with me in the next few weeks. Thanks

## 2020-08-20 NOTE — Progress Notes (Signed)
[facility] Progress Note  3 Days Post-Op  Subjective: CC-  Feels about the same. Main complaint is sore throat from NG. Continues to have some abdominal distension. No flatus or BM. NG tube in and working, flushes easily, 500cc output last 24 hr.  Objective: Vital signs in last 24 hours: Temp:  [98.1 F (36.7 C)-98.4 F (36.9 C)] 98.4 F (36.9 C) (10/05 0404) Pulse Rate:  [78-92] 78 (10/05 0404) Resp:  [17-18] 17 (10/05 0404) BP: (137-160)/(82-91) 158/91 (10/05 0404) SpO2:  [95 %-100 %] 100 % (10/05 0404) Last BM Date: 08/16/20  Intake/Output from previous day: 10/04 0701 - 10/05 0700 In: 1636.3 [I.V.:1328.4; IV Piggyback:307.9] Out: 2400 [Urine:1900; Emesis/NG output:500] Intake/Output this shift: No intake/output data recorded.  PE: Gen:  Alert, NAD, pleasant HEENT: EOM's intact, pupils equal and round Card:  RRR Pulm:  CTAB, no W/R/R, rate and effort normal Abd: Soft, distended, few BS, mild diffuse TTP, midline incision cdi with staples intact and honeycomb dressing in place   Lab Results:  Recent Labs    08/18/20 0316 08/19/20 0938  WBC 10.8* 10.4  HGB 11.3* 10.4*  HCT 32.7* 31.0*  PLT 236 239   BMET Recent Labs    08/18/20 0316 08/19/20 0938  NA 137 138  K 3.6 3.1*  CL 106 106  CO2 20* 17*  GLUCOSE 105* 108*  BUN 9 8  CREATININE 1.09 1.20  CALCIUM 8.4* 8.6*   PT/INR No results for input(s): LABPROT, INR in the last 72 hours. CMP     Component Value Date/Time   NA 138 08/19/2020 0938   K 3.1 (L) 08/19/2020 0938   CL 106 08/19/2020 0938   CO2 17 (L) 08/19/2020 0938   GLUCOSE 108 (H) 08/19/2020 0938   BUN 8 08/19/2020 0938   CREATININE 1.20 08/19/2020 0938   CALCIUM 8.6 (L) 08/19/2020 0938   PROT 8.5 (H) 08/13/2020 0958   ALBUMIN 4.3 08/13/2020 0958   AST 32 08/13/2020 0958   ALT 46 (H) 08/13/2020 0958   ALKPHOS 63 08/13/2020 0958   BILITOT 1.2 08/13/2020 0958   GFRNONAA >60 08/19/2020 0938   GFRAA >60 08/19/2020 0938    Lipase     Component Value Date/Time   LIPASE 25 08/13/2020 0958       Studies/Results: No results found.  Anti-infectives: Anti-infectives (From admission, onward)   Start     Dose/Rate Route Frequency Ordered Stop   08/17/20 0600  ceFAZolin (ANCEF) IVPB 2g/100 mL premix        2 g 200 mL/hr over 30 Minutes Intravenous On call to O.R. 08/16/20 1358 08/17/20 1035       Assessment/Plan DM-2 - SSI HTN - home meds, IV metoprolol PRN AKI - resolved  Small bowel obstruction S/p Diagnostic laparoscopy with lysis of adhesions, laparotomy with lysis of adhesions, Small bowel resection 10/2 Dr. Donne Hazel - POD#3 - Labs pending. Persistent ileus, continue NPO/NGT and await return in bowel function. Mobilize.   ID - ancef periop FEN - IVF, NPO/NGT to LIWS VTE - SCDs, sq heparin Foley - out 10/3 Follow up - Dr. Donne Hazel    LOS: 6 days    Wellington Hampshire, [facility] 08/20/2020, 7:35 AM Please see Amion for pager number during day hours 7:00am-4:30pm

## 2020-08-20 NOTE — Telephone Encounter (Signed)
appt made for 11/30 at 340 pm with Dr Ardis Hughs. Letter mailed to the pt with appt information.     From: Rolm Bookbinder, MD  Sent: 08/19/2020  8:39 AM EDT  To: Milus Banister, MD  Subject: Inpatient Notes                  Nicanor Bake saw him a couple years ago for sbo without any prior surgery. 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 There is pic in media tab of a big wad of small bowel that was concreted to itself. Not sure why.  Matt

## 2020-08-21 LAB — GLUCOSE, CAPILLARY
Glucose-Capillary: 100 mg/dL — ABNORMAL HIGH (ref 70–99)
Glucose-Capillary: 78 mg/dL (ref 70–99)
Glucose-Capillary: 79 mg/dL (ref 70–99)
Glucose-Capillary: 83 mg/dL (ref 70–99)
Glucose-Capillary: 89 mg/dL (ref 70–99)

## 2020-08-21 LAB — BASIC METABOLIC PANEL
Anion gap: 12 (ref 5–15)
BUN: 5 mg/dL — ABNORMAL LOW (ref 8–23)
CO2: 16 mmol/L — ABNORMAL LOW (ref 22–32)
Calcium: 8.1 mg/dL — ABNORMAL LOW (ref 8.9–10.3)
Chloride: 110 mmol/L (ref 98–111)
Creatinine, Ser: 1.07 mg/dL (ref 0.61–1.24)
GFR calc non Af Amer: >60 mL/min (ref >60)
Glucose, Bld: 86 mg/dL (ref 70–99)
Potassium: 3.5 mmol/L (ref 3.5–5.1)
Sodium: 138 mmol/L (ref 135–145)

## 2020-08-21 LAB — MAGNESIUM: Magnesium: 1.8 mg/dL (ref 1.7–2.4)

## 2020-08-21 MED ORDER — POTASSIUM CHLORIDE IN NACL 40-0.9 MEQ/L-% IV SOLN
INTRAVENOUS | Status: DC
  Filled 2020-08-21 (×8): qty 1000

## 2020-08-21 NOTE — Progress Notes (Addendum)
4 Days Post-Op    CC: Abdominal pain  Subjective: Patient says he is passing some flatus this AM.  He wants the NG out, he says he cannot tolerate it.  He reports some flatus.  He did not get out of bed yesterday, except to a chair.  He says he felt too sick, weak, he also has problems with back pain and balance.  This predates his admission.  NG sump was plugged, I flushed the NG, and the sump with air.  Not a lot of return.    Objective: Vital signs in last 24 hours: Temp:  [98.2 F (36.8 C)-99.4 F (37.4 C)] 98.2 F (36.8 C) (10/06 0453) Pulse Rate:  [81-91] 81 (10/06 0453) Resp:  [19-20] 20 (10/06 0453) BP: (135-160)/(85-90) 160/90 (10/06 0453) SpO2:  [100 %] 100 % (10/06 0453) Last BM Date: 08/16/20 N.p.o. 1670 IV Urine 2550 NG 1350 No BM recorded Afebrile diastolic blood pressure mildly elevated. Potassium 3.5 No films   Intake/Output from previous day: 10/05 0701 - 10/06 0700 In: 1670.4 [I.V.:1066.6; IV Piggyback:603.8] Out: 3900 [Urine:2550; Emesis/NG output:1350] Intake/Output this shift: Total I/O In: -  Out: 150 [Urine:150]  General appearance: alert, cooperative and no distress Resp: clear to auscultation bilaterally GI: Abdomen is large, few bowel sounds, patient reports some flatus.  1350 from the NG.  1000 recorded last p.m.  No BM.  Midline incision is clean.  Port sites okay.  Staples in place.  Lab Results:  Recent Labs    08/19/20 0938  WBC 10.4  HGB 10.4*  HCT 31.0*  PLT 239    BMET Recent Labs    08/20/20 1041 08/21/20 0323  NA 139 138  K 3.1* 3.5  CL 108 110  CO2 16* 16*  GLUCOSE 105* 86  BUN 7* 5*  CREATININE 1.05 1.07  CALCIUM 8.5* 8.1*   PT/INR No results for input(s): LABPROT, INR in the last 72 hours.  No results for input(s): AST, ALT, ALKPHOS, BILITOT, PROT, ALBUMIN in the last 168 hours.   Lipase     Component Value Date/Time   LIPASE 25 08/13/2020 0958     Prior to Admission medications   Medication Sig Start  Date End Date Taking? Authorizing Provider  aspirin 81 MG chewable tablet Chew 81 mg by mouth daily.   Yes [provider]  ibuprofen (ADVIL) 800 MG tablet Take 800 mg by mouth in the morning and at bedtime.   Yes [provider]  lisinopril-hydrochlorothiazide (ZESTORETIC) 20-12.5 MG tablet Take 1 tablet by mouth 2 (two) times daily. 07/02/20  Yes [provider]  metFORMIN (GLUCOPHAGE) 1000 MG tablet Take 1,000 mg by mouth 2 (two) times daily with a meal.   Yes [provider]  pantoprazole (PROTONIX) 40 MG tablet Take 1 tablet (40 mg total) by mouth 2 (two) times daily before a meal. 10/21/17  Yes Colbert Ewing, MD  pravastatin (PRAVACHOL) 20 MG tablet Take 20 mg by mouth daily.   Yes [provider]  solifenacin (VESICARE) 10 MG tablet Take 10 mg by mouth daily. 08/12/20  Yes [provider]  verapamil (CALAN-SR) 180 MG CR tablet Take 1 tablet by mouth daily.   Yes [provider]  cyclobenzaprine (FLEXERIL) 5 MG tablet Take 5 mg by mouth 3 (three) times daily as needed for muscle spasms.    [provider]  NEEDLE, DISP, 30 G (BD DISP NEEDLES) 30G X 1/2" MISC 10 Units by Does not apply route daily. 05/16/17  Allie Bossier, MD    Medications: . Chlorhexidine Gluconate Cloth  6 each Topical Daily  . darifenacin  15 mg Oral Daily  . folic acid  1 mg Oral Daily  . heparin  5,000 Units Subcutaneous Q8H  . insulin aspart  0-9 Units Subcutaneous Q4H  . multivitamin with minerals  1 tablet Oral Daily  . mupirocin ointment  1 application Nasal BID  . pantoprazole (PROTONIX) IV  40 mg Intravenous Q12H  . pravastatin  20 mg Oral Daily  . thiamine  100 mg Oral Daily   Or  . thiamine  100 mg Intravenous Daily  . verapamil  180 mg Oral Daily   . sodium chloride 100 mL/hr at 08/21/20 0444  . methocarbamol (ROBAXIN) IV      Assessment/Plan DM-2 - SSI HTN - home meds, IV metoprolol PRN AKI - resolved Hypokalemia  -K+  3.1>> 3.5  -Magnesium 1.8  - adding KCL to IV  Small bowelobstruction S/pDiagnostic laparoscopy with lysis of adhesions, laparotomy with lysis of adhesions,Small bowel resection10/2 Dr. Donne Hazel -POD#4 -  Persistent ileus, continue NPO/NGT and await return in bowel function. Mobilize.   ID -ancef periop FEN -IVF, NPO/NGT to LIWS VTE -SCDs, sq heparin Foley -out 10/3 Follow up -Dr. Donne Hazel    Plan: He wants the NG out.  I told him I would prefer to leave it in this morning and try to explain what an ileus is.  I am going to try clamping the chair this a.m. and we will see how he does with a clamp.  We will also give him some ice chips and sips.  If he tolerates this we could possibly remove it later today.t PT saw him yesterday and did not recommend any PT or follow up.   NO IS in the room.   LOS: 7 days    Jeremiah Bowman 08/21/2020 Please see Amion

## 2020-08-21 NOTE — Progress Notes (Signed)
Pt has had NGT clamped since 1030. 200 cc residual when hooked back up to suction.

## 2020-08-21 NOTE — Progress Notes (Signed)
Physical Therapy Treatment Patient Details Name: Jeremiah Bowman MRN: [id number] DOB: 07/14/1956 Today's Date: 08/21/2020    History of Present Illness Jeremiah Bowman is a 64 y.o. male with medical history significant of DM2, HTN, prior SBOs of unclear cause. Presents to ED with N/V and diffuse lower abdominal pain. Underwent laparoscopy with small bowel resection on 10/2.    PT Comments    Pt presents with general deconditioning and fatigue due to the above diagnosis. Pt is demonstrating good progress with mobility. Pt is ambulating on the unit with RW and supervision. Anticipate pt will reach Mod I shortly with RW and will not need follow-up therapy. Recommend ambulation on the unit with nursing staff to increase activity level and independence. Recommend continued acute PT to maximize mobility and Independence until d/c home or goals met.   Follow Up Recommendations  No PT follow up     Equipment Recommendations  None recommended by PT    Recommendations for Other Services       Precautions / Restrictions Precautions Precaution Comments: NG removed this afternoon    Mobility  Bed Mobility               General bed mobility comments: pt received in chair  Transfers Overall transfer level: Needs assistance Equipment used: Rolling walker (2 wheeled) Transfers: Sit to/from Stand Sit to Stand: Modified independent (Device/Increase time)            Ambulation/Gait Ambulation/Gait assistance: Supervision Gait Distance (Feet): 500 Feet Assistive device: Rolling walker (2 wheeled) Gait Pattern/deviations: Step-through pattern Gait velocity: decreased   General Gait Details: general fatigue   Stairs             Wheelchair Mobility    Modified Rankin (Stroke Patients Only)       Balance Overall balance assessment: No apparent balance deficits (not formally assessed)                                          Cognition Arousal/Alertness:  Awake/alert Behavior During Therapy: WFL for tasks assessed/performed Overall Cognitive Status: Within Functional Limits for tasks assessed                                        Exercises General Exercises - Lower Extremity Long Arc Quad: AROM;Strengthening;Both;15 reps;Seated Hip ABduction/ADduction: AROM;Strengthening;Both;15 reps;Standing Hip Flexion/Marching: AROM;Strengthening;Both;15 reps;Seated    General Comments        Pertinent Vitals/Pain Pain Assessment: 0-10 Pain Score: 5  Pain Location: abdomen Pain Descriptors / Indicators: Aching;Sore;Guarding;Grimacing Pain Intervention(s): Limited activity within patient's tolerance;Monitored during session;Patient requesting pain meds-RN notified    Home Living                      Prior Function            PT Goals (current goals can now be found in the care plan section) Progress towards PT goals: Progressing toward goals    Frequency    Min 3X/week      PT Plan Current plan remains appropriate    Co-evaluation              AM-PAC PT "6 Clicks" Mobility   Outcome Measure  Help needed turning from your back to your side while in a flat bed  without using bedrails?: A Little Help needed moving from lying on your back to sitting on the side of a flat bed without using bedrails?: A Little Help needed moving to and from a bed to a chair (including a wheelchair)?: A Little Help needed standing up from a chair using your arms (e.g., wheelchair or bedside chair)?: None Help needed to walk in hospital room?: A Little Help needed climbing 3-5 steps with a railing? : A Little 6 Click Score: 19    End of Session   Activity Tolerance: Patient tolerated treatment well Patient left: in chair;with call bell/phone within reach;with family/visitor present Nurse Communication: Mobility status PT Visit Diagnosis: Difficulty in walking, not elsewhere classified (R26.2);Pain     Time:  1520-1551 PT Time Calculation (min) (ACUTE ONLY): 31 min  Charges:  $Gait Training: 8-22 mins $Therapeutic Exercise: 8-22 mins                     Lelon Mast 08/21/2020, 4:05 PM

## 2020-08-22 LAB — GLUCOSE, CAPILLARY
Glucose-Capillary: 114 mg/dL — ABNORMAL HIGH (ref 70–99)
Glucose-Capillary: 118 mg/dL — ABNORMAL HIGH (ref 70–99)
Glucose-Capillary: 119 mg/dL — ABNORMAL HIGH (ref 70–99)
Glucose-Capillary: 121 mg/dL — ABNORMAL HIGH (ref 70–99)
Glucose-Capillary: 129 mg/dL — ABNORMAL HIGH (ref 70–99)
Glucose-Capillary: 166 mg/dL — ABNORMAL HIGH (ref 70–99)
Glucose-Capillary: 92 mg/dL (ref 70–99)

## 2020-08-22 LAB — MAGNESIUM: Magnesium: 1.5 mg/dL — ABNORMAL LOW (ref 1.7–2.4)

## 2020-08-22 LAB — BASIC METABOLIC PANEL
Anion gap: 13 (ref 5–15)
BUN: 5 mg/dL — ABNORMAL LOW (ref 8–23)
CO2: 16 mmol/L — ABNORMAL LOW (ref 22–32)
Calcium: 8.2 mg/dL — ABNORMAL LOW (ref 8.9–10.3)
Chloride: 111 mmol/L (ref 98–111)
Creatinine, Ser: 0.98 mg/dL (ref 0.61–1.24)
GFR calc non Af Amer: >60 mL/min (ref >60)
Glucose, Bld: 102 mg/dL — ABNORMAL HIGH (ref 70–99)
Potassium: 3.7 mmol/L (ref 3.5–5.1)
Sodium: 140 mmol/L (ref 135–145)

## 2020-08-22 LAB — CBC
HCT: 27.8 % — ABNORMAL LOW (ref 39.0–52.0)
Hemoglobin: 9.3 g/dL — ABNORMAL LOW (ref 13.0–17.0)
MCH: 28.7 pg (ref 26.0–34.0)
MCHC: 33.5 g/dL (ref 30.0–36.0)
MCV: 85.8 fL (ref 80.0–100.0)
Platelets: 285 10*3/uL (ref 150–400)
RBC: 3.24 MIL/uL — ABNORMAL LOW (ref 4.22–5.81)
RDW: 13.5 % (ref 11.5–15.5)
WBC: 10.8 10*3/uL — ABNORMAL HIGH (ref 4.0–10.5)
nRBC: 0 % (ref 0.0–0.2)

## 2020-08-22 MED ORDER — OXYCODONE HCL 5 MG PO TABS
5.0000 mg | ORAL_TABLET | ORAL | Status: DC | PRN
  Administered 2020-08-22 – 2020-08-23 (×2): 5 mg via ORAL
  Filled 2020-08-22 (×2): qty 1

## 2020-08-22 MED ORDER — LISINOPRIL 20 MG PO TABS
20.0000 mg | ORAL_TABLET | Freq: Two times a day (BID) | ORAL | Status: DC
  Administered 2020-08-22 – 2020-08-26 (×9): 20 mg via ORAL
  Filled 2020-08-22 (×9): qty 1

## 2020-08-22 MED ORDER — MORPHINE SULFATE (PF) 2 MG/ML IV SOLN
2.0000 mg | INTRAVENOUS | Status: DC | PRN
  Administered 2020-08-23: 2 mg via INTRAVENOUS
  Filled 2020-08-22: qty 1

## 2020-08-22 MED ORDER — BOOST / RESOURCE BREEZE PO LIQD CUSTOM
1.0000 | Freq: Three times a day (TID) | ORAL | Status: DC
  Administered 2020-08-22 – 2020-08-24 (×3): 1 via ORAL

## 2020-08-22 MED ORDER — ACETAMINOPHEN 500 MG PO TABS
1000.0000 mg | ORAL_TABLET | Freq: Three times a day (TID) | ORAL | Status: DC
  Administered 2020-08-22 (×2): 1000 mg via ORAL
  Filled 2020-08-22 (×10): qty 2

## 2020-08-22 MED ORDER — MAGNESIUM SULFATE 2 GM/50ML IV SOLN
2.0000 g | Freq: Once | INTRAVENOUS | Status: AC
  Administered 2020-08-22: 2 g via INTRAVENOUS
  Filled 2020-08-22: qty 50

## 2020-08-22 MED ORDER — HYDROCHLOROTHIAZIDE 12.5 MG PO CAPS
12.5000 mg | ORAL_CAPSULE | Freq: Two times a day (BID) | ORAL | Status: DC
  Administered 2020-08-22 – 2020-08-26 (×9): 12.5 mg via ORAL
  Filled 2020-08-22 (×9): qty 1

## 2020-08-22 MED ORDER — LISINOPRIL-HYDROCHLOROTHIAZIDE 20-12.5 MG PO TABS: 1.0000 | ORAL_TABLET | Freq: Two times a day (BID) | ORAL | Status: DC

## 2020-08-22 MED ORDER — METHOCARBAMOL 500 MG PO TABS
500.0000 mg | ORAL_TABLET | Freq: Three times a day (TID) | ORAL | Status: DC
  Administered 2020-08-22 – 2020-08-26 (×13): 500 mg via ORAL
  Filled 2020-08-22 (×13): qty 1

## 2020-08-22 NOTE — Progress Notes (Signed)
Initial Nutrition Assessment  DOCUMENTATION CODES:   Obesity unspecified  INTERVENTION:  Provide Boost Breeze po TID, each supplement provides 250 kcal and 9 grams of protein.  Encourage adequate PO intake.   NUTRITION DIAGNOSIS:   Increased nutrient needs related to post-op healing as evidenced by estimated needs.  GOAL:   Patient will meet greater than or equal to 90% of their needs  MONITOR:   PO intake, Supplement acceptance, Skin, Weight trends, Labs, I & O's, Diet advancement  REASON FOR ASSESSMENT:   NPO/Clear Liquid Diet    ASSESSMENT:   64 y.o. male with medical history significant of DM2, HTN, prior SBOs presents with abdominal pains. CT abd/pevlis confirms SBO   Procedure (10/2):  Diagnostic laparoscopy with lysis of adhesions, Laparotomy with lysis of adhesions, Small bowel resection  NGT removed yesterday. Diet advanced to a clear liquid diet this morning. Pt reports abdominal pains are improving, however incision site continues to be sore. Meal completion 100% today. Pt has been tolerating his liquids thus far. Pt reports eating well at home with usual consumption of 3 meals a day prior to current illness.  NUTRITION - FOCUSED PHYSICAL EXAM:    Most Recent Value  Orbital Region No depletion  Upper Arm Region No depletion  Thoracic and Lumbar Region No depletion  Buccal Region No depletion  Temple Region No depletion  Clavicle Bone Region No depletion  Clavicle and Acromion Bone Region No depletion  Scapular Bone Region No depletion  Dorsal Hand No depletion  Patellar Region No depletion  Anterior Thigh Region No depletion  Posterior Calf Region No depletion  Edema (RD Assessment) None  Hair Reviewed  Eyes Reviewed  Mouth Reviewed  Skin Reviewed  Nails Reviewed     Labs and medications reviewed.   Diet Order:   Diet Order            Diet clear liquid Room service appropriate? Yes; Fluid consistency: Thin  Diet effective now                  EDUCATION NEEDS:   Not appropriate for education at this time  Skin:  Skin Assessment: Skin Integrity Issues: Skin Integrity Issues:: Incisions Incisions: abdomen  Last BM:  10/1  Height:   Ht Readings from Last 1 Encounters:  08/13/20 5\' 8"  (1.727 m)    Weight:   Wt Readings from Last 1 Encounters:  08/22/20 90.5 kg   BMI:  Body mass index is 30.34 kg/m.  Estimated Nutritional Needs:   Kcal:  2050-2300  Protein:  105-115 grams  Fluid:  >/= 2 L/day  Corrin Parker, MS, RD, LDN RD pager number/after hours weekend pager number on Amion.

## 2020-08-22 NOTE — Progress Notes (Signed)
Physical Therapy Treatment Patient Details Name: Jeremiah Bowman MRN: [id number] DOB: [birth year] Today's Date: 08/22/2020    History of Present Illness Jeremiah Bowman is a 64 y.o. male with medical history significant of DM2, HTN, prior SBOs of unclear cause. Presents to ED with N/V and diffuse lower abdominal pain. Underwent laparoscopy with small bowel resection on 10/2.    PT Comments    Pt reported he is tired today and declined ambulation. Pt was agreeable to bed exercises. Pt completed LE there ex with verbal cues and no physical assistance needed. Pt will continue to benefit from acute skilled PT to maximize mobility and Independence.  Follow Up Recommendations  No PT follow up     Equipment Recommendations  None recommended by PT    Recommendations for Other Services       Precautions / Restrictions Precautions Precautions: Fall Restrictions Weight Bearing Restrictions: No    Mobility  Bed Mobility                  Transfers                    Ambulation/Gait                 Stairs             Wheelchair Mobility    Modified Rankin (Stroke Patients Only)       Balance                                            Cognition Arousal/Alertness: Awake/alert Behavior During Therapy: WFL for tasks assessed/performed Overall Cognitive Status: Within Functional Limits for tasks assessed                                 General Comments: Pt declined therapy earlier today due to fatigue. This afternoon he was agreeable to do bed exercises.      Exercises General Exercises - Lower Extremity Ankle Circles/Pumps: AROM;Both;15 reps;Supine Quad Sets: AROM;Strengthening;Both;15 reps;Supine Heel Slides: AROM;Strengthening;Both;15 reps;Supine Hip ABduction/ADduction: AROM;Strengthening;Both;15 reps;Supine Straight Leg Raises: AROM;Strengthening;Both;15 reps;Supine    General Comments        Pertinent  Vitals/Pain Pain Assessment: Faces Faces Pain Scale: Hurts little more Pain Location: abdomen Pain Descriptors / Indicators: Discomfort Pain Intervention(s): Limited activity within patient's tolerance;Monitored during session;Repositioned    Home Living                      Prior Function            PT Goals (current goals can now be found in the care plan section) Progress towards PT goals: Progressing toward goals    Frequency    Min 3X/week      PT Plan Current plan remains appropriate    Co-evaluation              AM-PAC PT "6 Clicks" Mobility   Outcome Measure    Help needed moving from lying on your back to sitting on the side of a flat bed without using bedrails?: A Little Help needed moving to and from a bed to a chair (including a wheelchair)?: A Little Help needed standing up from a chair using your arms (e.g., wheelchair or bedside chair)?: None Help needed to walk in hospital room?: A  Little Help needed climbing 3-5 steps with a railing? : A Little 6 Click Score: 16    End of Session   Activity Tolerance: Patient limited by fatigue Patient left: in bed;with call bell/phone within reach   PT Visit Diagnosis: Difficulty in walking, not elsewhere classified (R26.2);Pain     Time: 2341-4436 PT Time Calculation (min) (ACUTE ONLY): 11 min  Charges:  $Therapeutic Exercise: 8-22 mins                      Lelon Mast 08/22/2020, 2:30 PM

## 2020-08-22 NOTE — Plan of Care (Signed)
  Problem: Education: Goal: Knowledge of General Education information will improve Description: Including pain rating scale, medication(s)/side effects and non-pharmacologic comfort measures Outcome: Progressing   Problem: Nutrition: Goal: Adequate nutrition will be maintained Outcome: Progressing  Educated patient to advance diet slowly. Patient with no c/o nausea today, tolerates frequent small clear liquid snacks.

## 2020-08-22 NOTE — Progress Notes (Signed)
5 Days Post-Op    CC: Abdominal pain  Subjective: Lying in bed but fairly comfortable.  Midline incision and port sites are okay.  Staples are in place.  He is having flatus but no BM so far.  He did walk with physical therapy yesterday and is using a rolling walker.  No nausea or vomiting after removal of the NG tube, tolerating sips and chips well.  Objective: Vital signs in last 24 hours: Temp:  [98.8 F (37.1 C)-99.6 F (37.6 C)] 98.8 F (37.1 C) (10/07 0730) Pulse Rate:  [92-96] 96 (10/07 0730) Resp:  [18-24] 24 (10/07 0730) BP: (142-155)/(92-99) 155/92 (10/07 0730) SpO2:  [99 %-100 %] 99 % (10/07 0730) Weight:  [90.5 kg-94.9 kg] 90.5 kg (10/07 0402) Last BM Date: 08/16/20 220 p.o. recorded 2650 urine NG 300/removed yesterday afternoon No BM recorded. Afebrile,blood pressure is mildly elevated.  Vital signs are otherwise stable.   Intake/Output from previous day: 10/06 0701 - 10/07 0700 In: 220 [P.O.:220] Out: 2950 [Urine:2650; Emesis/NG output:300] Intake/Output this shift: No intake/output data recorded.  General appearance: alert, cooperative and no distress Resp: clear to auscultation bilaterally and Moving about 1000 on incentive spirometry. GI: Large abdomen bowel sounds still hypoactive passing flatus no BM.  Midline incision/port sites okay staples in place.  Lab Results:  Recent Labs    08/19/20 0938 08/22/20 0440  WBC 10.4 10.8*  HGB 10.4* 9.3*  HCT 31.0* 27.8*  PLT 239 285    BMET Recent Labs    08/21/20 0323 08/22/20 0440  NA 138 140  K 3.5 3.7  CL 110 111  CO2 16* 16*  GLUCOSE 86 102*  BUN 5* 5*  CREATININE 1.07 0.98  CALCIUM 8.1* 8.2*   PT/INR No results for input(s): LABPROT, INR in the last 72 hours.  No results for input(s): AST, ALT, ALKPHOS, BILITOT, PROT, ALBUMIN in the last 168 hours.   Lipase     Component Value Date/Time   LIPASE 25 08/13/2020 0958     Medications:  Chlorhexidine Gluconate Cloth  6 each Topical  Daily   darifenacin  15 mg Oral Daily   folic acid  1 mg Oral Daily   heparin  5,000 Units Subcutaneous Q8H   insulin aspart  0-9 Units Subcutaneous Q4H   multivitamin with minerals  1 tablet Oral Daily   pantoprazole (PROTONIX) IV  40 mg Intravenous Q12H   pravastatin  20 mg Oral Daily   thiamine  100 mg Oral Daily   Or   thiamine  100 mg Intravenous Daily   verapamil  180 mg Oral Daily    Assessment/Plan DM-2 - SSI HTN - home meds, IV metoprolol PRN AKI - resolved Hypokalemia  -K+ 3.1>> 3.5>>3.7  -Magnesium 1.8>>1.5 - will replace  - adding KCL to IV  Small bowelobstruction S/pDiagnostic laparoscopy with lysis of adhesions, laparotomy with lysis of adhesions,Small bowel resection10/2 Dr. Donne Hazel -POD#5 -  Persistent ileus, continue NPO/NGT and await return in bowel function. Mobilize.   ID -ancef periop FEN -IVF, NPO/NGT to LIWS VTE -SCDs, sq heparin Foley -out 10/3 Follow up -Dr. Donne Hazel  Plan: Clear liquids, continue to mobilize, replace magnesium, restart some of his p.o. blood pressure medicine, PO pain meds. Await return of bowel function.    LOS: 8 days    Dawon Troop 08/22/2020 Please see Amion

## 2020-08-22 NOTE — Progress Notes (Signed)
PT Cancellation Note  Patient Details Name: Standley Bargo MRN: [id number] DOB: 1956/05/27   Cancelled Treatment:    Reason Eval/Treat Not Completed: Other (comment) (pt declined, wanting to eat first. Pt given menu and therapist dialed number for dietary for him.)   Carlene Coria 08/22/2020, 10:16 AM

## 2020-08-23 LAB — COMPREHENSIVE METABOLIC PANEL
ALT: 20 U/L (ref 0–44)
AST: 20 U/L (ref 15–41)
Albumin: 2.6 g/dL — ABNORMAL LOW (ref 3.5–5.0)
Alkaline Phosphatase: 63 U/L (ref 38–126)
Anion gap: 13 (ref 5–15)
BUN: <5 mg/dL — ABNORMAL LOW (ref 8–23)
CO2: 16 mmol/L — ABNORMAL LOW (ref 22–32)
Calcium: 9 mg/dL (ref 8.9–10.3)
Chloride: 111 mmol/L (ref 98–111)
Creatinine, Ser: 1.08 mg/dL (ref 0.61–1.24)
GFR calc non Af Amer: >60 mL/min (ref >60)
Glucose, Bld: 174 mg/dL — ABNORMAL HIGH (ref 70–99)
Potassium: 3.9 mmol/L (ref 3.5–5.1)
Sodium: 140 mmol/L (ref 135–145)
Total Bilirubin: 1.1 mg/dL (ref 0.3–1.2)
Total Protein: 6.8 g/dL (ref 6.5–8.1)

## 2020-08-23 LAB — GLUCOSE, CAPILLARY
Glucose-Capillary: 109 mg/dL — ABNORMAL HIGH (ref 70–99)
Glucose-Capillary: 126 mg/dL — ABNORMAL HIGH (ref 70–99)
Glucose-Capillary: 131 mg/dL — ABNORMAL HIGH (ref 70–99)
Glucose-Capillary: 138 mg/dL — ABNORMAL HIGH (ref 70–99)
Glucose-Capillary: 139 mg/dL — ABNORMAL HIGH (ref 70–99)
Glucose-Capillary: 146 mg/dL — ABNORMAL HIGH (ref 70–99)
Glucose-Capillary: 175 mg/dL — ABNORMAL HIGH (ref 70–99)

## 2020-08-23 MED ORDER — BISACODYL 10 MG RE SUPP
10.0000 mg | Freq: Once | RECTAL | Status: AC
  Administered 2020-08-23: 10 mg via RECTAL
  Filled 2020-08-23: qty 1

## 2020-08-23 MED ORDER — CHLORHEXIDINE GLUCONATE CLOTH 2 % EX PADS
6.0000 | MEDICATED_PAD | Freq: Every day | CUTANEOUS | Status: DC
  Administered 2020-08-23 – 2020-08-25 (×3): 6 via TOPICAL

## 2020-08-23 NOTE — Progress Notes (Signed)
Patient encouraged to ambulate more, refused at this time, was told to come back later.

## 2020-08-23 NOTE — Progress Notes (Signed)
6 Days Post-Op    CC:  Abdominal pain Subjective: Got distended and nauseated last PM.  No vomiting, some flatus, no BM.  Midline wound and port sites ok.    Objective: Vital signs in last 24 hours: Temp:  [98 F (36.7 C)-98.7 F (37.1 C)] 98.2 F (36.8 C) (10/08 0405) Pulse Rate:  [93-106] 96 (10/08 0539) Resp:  [18-21] 20 (10/08 0405) BP: (155-169)/(95-106) 155/95 (10/08 0539) SpO2:  [99 %-100 %] 100 % (10/08 0405) Weight:  [92.7 kg] 92.7 kg (10/08 0431) Last BM Date: 08/16/20 1035 p.o. 1500 IV 1400 urine No BM recorded CMP: CO2 16, glucose 174, albumin 2.6 remainder the study is normal. K+ 3.9 Intake/Output from previous day: 10/07 0701 - 10/08 0700 In: 2578.5 [P.O.:1035; I.V.:1493.5; IV Piggyback:50] Out: 1400 [Urine:1400] Intake/Output this shift: No intake/output data recorded.  General appearance: alert, cooperative and no distress Resp: clear to auscultation bilaterally and still moving just over 1000 on IS GI: Distended, BS hypoactive, No BM, some flatus  Lab Results:  Recent Labs    08/22/20 0440  WBC 10.8*  HGB 9.3*  HCT 27.8*  PLT 285    BMET Recent Labs    08/22/20 0440 08/23/20 0330  NA 140 140  K 3.7 3.9  CL 111 111  CO2 16* 16*  GLUCOSE 102* 174*  BUN 5* <5*  CREATININE 0.98 1.08  CALCIUM 8.2* 9.0   PT/INR No results for input(s): LABPROT, INR in the last 72 hours.  Recent Labs  Lab 08/23/20 0330  AST 20  ALT 20  ALKPHOS 63  BILITOT 1.1  PROT 6.8  ALBUMIN 2.6*     Lipase     Component Value Date/Time   LIPASE 25 08/13/2020 0958     Medications: . acetaminophen  1,000 mg Oral Q8H  . Chlorhexidine Gluconate Cloth  6 each Topical Daily  . darifenacin  15 mg Oral Daily  . feeding supplement  1 Container Oral TID BM  . folic acid  1 mg Oral Daily  . heparin  5,000 Units Subcutaneous Q8H  . lisinopril  20 mg Oral BID   And  . hydrochlorothiazide  12.5 mg Oral BID  . insulin aspart  0-9 Units Subcutaneous Q4H  .  methocarbamol  500 mg Oral TID  . multivitamin with minerals  1 tablet Oral Daily  . pantoprazole (PROTONIX) IV  40 mg Intravenous Q12H  . pravastatin  20 mg Oral Daily  . thiamine  100 mg Oral Daily   Or  . thiamine  100 mg Intravenous Daily  . verapamil  180 mg Oral Daily    Assessment/Plan DM-2 - SSI HTN - home meds, IV metoprolol PRN AKI - resolved Hypokalemia -K+ 3.1>>3.5>>3.7 -Magnesium 1.8>>1.5 - will replace - adding KCL to IV  Small bowelobstruction S/pDiagnostic laparoscopy with lysis of adhesions, laparotomy with lysis of adhesions,Small bowel resection10/2 Dr. Donne Hazel -POD#6 - Persistent ileus,   - Mobilize  ID -ancef periop FEN -IVF/ Clears >>  Sips and chips today VTE -SCDs, sq heparin Foley -out 10/3 Follow up -Dr. Donne Hazel  Plan: He is not advanced at all since yesterday.  He feels more distended.  Plan to continue mobilizing and go back to sips and chips for his diet.   Primary  LOS: 9 days    Ameenah Prosser 08/23/2020 Please see Amion

## 2020-08-24 LAB — BASIC METABOLIC PANEL
Anion gap: 11 (ref 5–15)
BUN: 7 mg/dL — ABNORMAL LOW (ref 8–23)
CO2: 19 mmol/L — ABNORMAL LOW (ref 22–32)
Calcium: 8.4 mg/dL — ABNORMAL LOW (ref 8.9–10.3)
Chloride: 112 mmol/L — ABNORMAL HIGH (ref 98–111)
Creatinine, Ser: 1.03 mg/dL (ref 0.61–1.24)
GFR, Estimated: >60 mL/min (ref >60)
Glucose, Bld: 115 mg/dL — ABNORMAL HIGH (ref 70–99)
Potassium: 3.8 mmol/L (ref 3.5–5.1)
Sodium: 142 mmol/L (ref 135–145)

## 2020-08-24 LAB — MAGNESIUM: Magnesium: 1.5 mg/dL — ABNORMAL LOW (ref 1.7–2.4)

## 2020-08-24 LAB — GLUCOSE, CAPILLARY
Glucose-Capillary: 116 mg/dL — ABNORMAL HIGH (ref 70–99)
Glucose-Capillary: 99 mg/dL (ref 70–99)
Glucose-Capillary: 106 mg/dL — ABNORMAL HIGH (ref 70–99)
Glucose-Capillary: 109 mg/dL — ABNORMAL HIGH (ref 70–99)
Glucose-Capillary: 120 mg/dL — ABNORMAL HIGH (ref 70–99)

## 2020-08-24 MED ORDER — MAGNESIUM SULFATE 2 GM/50ML IV SOLN
2.0000 g | Freq: Once | INTRAVENOUS | Status: AC
  Administered 2020-08-24: 2 g via INTRAVENOUS
  Filled 2020-08-24: qty 50

## 2020-08-24 NOTE — Progress Notes (Signed)
7 Days Post-Op    CC: Abdominal pain  Subjective: Patient is doing much better this a.m. he has had actually 2 bowel movements.  Abdomen is a little bit softer still distended staples are all fine.  Objective: Vital signs in last 24 hours: Temp:  [98.1 F (36.7 C)-98.2 F (36.8 C)] 98.2 F (36.8 C) (10/09 0400) Pulse Rate:  [94-108] 94 (10/09 0400) Resp:  [17-18] 17 (10/09 0400) BP: (136-143)/(90-95) 136/90 (10/09 0400) SpO2:  [100 %] 100 % (10/09 0400) Last BM Date: 08/23/20 260 p.o. 1002 IV 300 urine BM x1 after suppository Afebrile, blood pressure still mildly elevated diastolic. Potassium 3.8, magnesium 1.5 Glucose 109-146 range    Intake/Output from previous day: 10/08 0701 - 10/09 0700 In: 1261.7 [P.O.:260; I.V.:1001.7] Out: 301 [Urine:300; Stool:1] Intake/Output this shift: No intake/output data recorded.  General appearance: alert, cooperative and no distress Resp: clear to auscultation bilaterally GI: Abdomen slightly softer less distended, positive bowel sounds positive flatus and he reports 2 BMs yesterday. Lab Results:  Recent Labs    08/22/20 0440  WBC 10.8*  HGB 9.3*  HCT 27.8*  PLT 285    BMET Recent Labs    08/23/20 0330 08/24/20 0342  NA 140 142  K 3.9 3.8  CL 111 112*  CO2 16* 19*  GLUCOSE 174* 115*  BUN <5* 7*  CREATININE 1.08 1.03  CALCIUM 9.0 8.4*   PT/INR No results for input(s): LABPROT, INR in the last 72 hours.  Recent Labs  Lab 08/23/20 0330  AST 20  ALT 20  ALKPHOS 63  BILITOT 1.1  PROT 6.8  ALBUMIN 2.6*     Lipase     Component Value Date/Time   LIPASE 25 08/13/2020 0958     Medications:  acetaminophen  1,000 mg Oral Q8H   Chlorhexidine Gluconate Cloth  6 each Topical Daily   darifenacin  15 mg Oral Daily   feeding supplement  1 Container Oral TID BM   folic acid  1 mg Oral Daily   heparin  5,000 Units Subcutaneous Q8H   lisinopril  20 mg Oral BID   And   hydrochlorothiazide  12.5 mg Oral  BID   insulin aspart  0-9 Units Subcutaneous Q4H   methocarbamol  500 mg Oral TID   multivitamin with minerals  1 tablet Oral Daily   pantoprazole (PROTONIX) IV  40 mg Intravenous Q12H   pravastatin  20 mg Oral Daily   thiamine  100 mg Oral Daily   Or   thiamine  100 mg Intravenous Daily   verapamil  180 mg Oral Daily    Assessment/Plan DM-2 - SSI HTN - home meds, IV metoprolol PRN AKI - resolved Hypokalemia -K+ 3.1>>3.5>>3.7 -Magnesium 1.8>>1.5 - will replace - adding KCL to IV  Small bowelobstruction S/pDiagnostic laparoscopy with lysis of adhesions, laparotomy with lysis of adhesions,Small bowel resection10/2 Dr. Donne Hazel -POD#7 - Persistent ileus,   - Mobilize  ID -ancef periop FEN -IVF/ Clears >>  Sips and chips today VTE -SCDs, sq heparin Foley -out 10/3 Follow up -Dr. Donne Hazel  Plan: Advance to clear liquids this a.m., full liquids in the p.m.  He does well soft diet in a.m. discussed discharge home possibly on Sunday or Monday.  He will need follow-up in the office to remove his staples, and follow-up with Dr. Donne Hazel.    LOS: 10 days    Ophelia Sipe 08/24/2020 Please see Amion

## 2020-08-25 LAB — GLUCOSE, CAPILLARY
Glucose-Capillary: 105 mg/dL — ABNORMAL HIGH (ref 70–99)
Glucose-Capillary: 115 mg/dL — ABNORMAL HIGH (ref 70–99)
Glucose-Capillary: 139 mg/dL — ABNORMAL HIGH (ref 70–99)
Glucose-Capillary: 108 mg/dL — ABNORMAL HIGH (ref 70–99)
Glucose-Capillary: 116 mg/dL — ABNORMAL HIGH (ref 70–99)
Glucose-Capillary: 114 mg/dL — ABNORMAL HIGH (ref 70–99)

## 2020-08-25 MED ORDER — METFORMIN HCL 500 MG PO TABS
1000.0000 mg | ORAL_TABLET | Freq: Two times a day (BID) | ORAL | Status: DC
  Administered 2020-08-25 – 2020-08-26 (×3): 1000 mg via ORAL
  Filled 2020-08-25 (×3): qty 2

## 2020-08-25 NOTE — Plan of Care (Signed)

## 2020-08-25 NOTE — Progress Notes (Signed)
8 Days Post-Op    CC: Abdominal pain Subjective: Drainage on his lower abdominal dressing.  Lower half of his staples removed, he had some hematoma and then purulent drainage from his lower abdominal site.  It's about 5 cm deep.  Cleaned and packed wet to dry.  Tolerating full liquids and will advance to soft diet.  Objective: Vital signs in last 24 hours: Temp:  [98.2 F (36.8 C)-98.7 F (37.1 C)] 98.2 F (36.8 C) (10/10 0430) Pulse Rate:  [83-87] 87 (10/10 0430) Resp:  [16-18] 18 (10/10 0430) BP: (132-149)/(92-96) 149/92 (10/10 0430) SpO2:  [100 %] 100 % (10/10 0430) Last BM Date: 08/24/20 600 PO Urine 1730 Stool x 1 Afebrile, VSS Diastolic BP still up some  Intake/Output from previous day: 10/09 0701 - 10/10 0700 In: 600 [P.O.:600] Out: 1730 [Urine:1730] Intake/Output this shift: No intake/output data recorded.  General appearance: alert, cooperative and no distress Resp: clear to auscultation bilaterally; up to 2000 on IS. GI: + BS and +BM, he had drainage from the lower abdominal incision, staple removed for at least 1/2 of the incision and started on Wet to dry dressing  Lab Results:  No results for input(s): WBC, HGB, HCT, PLT in the last 72 hours.  BMET Recent Labs    08/23/20 0330 08/24/20 0342  NA 140 142  K 3.9 3.8  CL 111 112*  CO2 16* 19*  GLUCOSE 174* 115*  BUN <5* 7*  CREATININE 1.08 1.03  CALCIUM 9.0 8.4*   PT/INR No results for input(s): LABPROT, INR in the last 72 hours.  Recent Labs  Lab 08/23/20 0330  AST 20  ALT 20  ALKPHOS 63  BILITOT 1.1  PROT 6.8  ALBUMIN 2.6*     Lipase     Component Value Date/Time   LIPASE 25 08/13/2020 0958     Medications: . acetaminophen  1,000 mg Oral Q8H  . Chlorhexidine Gluconate Cloth  6 each Topical Daily  . darifenacin  15 mg Oral Daily  . feeding supplement  1 Container Oral TID BM  . folic acid  1 mg Oral Daily  . heparin  5,000 Units Subcutaneous Q8H  . lisinopril  20 mg Oral BID    And  . hydrochlorothiazide  12.5 mg Oral BID  . insulin aspart  0-9 Units Subcutaneous Q4H  . methocarbamol  500 mg Oral TID  . multivitamin with minerals  1 tablet Oral Daily  . pantoprazole (PROTONIX) IV  40 mg Intravenous Q12H  . pravastatin  20 mg Oral Daily  . thiamine  100 mg Oral Daily   Or  . thiamine  100 mg Intravenous Daily  . verapamil  180 mg Oral Daily    Assessment/Plan DM-2 - SSI HTN - home meds, IV metoprolol PRN AKI - resolved Hypokalemia -K+ 3.1>>3.5>>3.7 -Magnesium 1.8>>1.5 - will replace - adding KCL to IV  Small bowelobstruction S/pDiagnostic laparoscopy with lysis of adhesions, laparotomy with lysis of adhesions,Small bowel resection10/2 Dr. Donne Hazel -POD#8 - Persistent ileus, - Mobilize  ID -ancef periop FEN -IVF/Fulls>> carb Mod VTE -SCDs, sq heparin Foley -out 10/3 Follow up -Dr. Donne Hazel   Plan:  Carb mod diet, start wet to dry dressing to lower abdominal site. Restart Glucophage, today. CBG's AC & HS. Aim for DC tomorrow if the wound is OK and he is tolerating diet. Recheck labs in AM.    LOS: 11 days    Jeremiah Bowman 08/25/2020 Please see Amion

## 2020-08-26 LAB — CBC
HCT: 28.3 % — ABNORMAL LOW (ref 39.0–52.0)
Hemoglobin: 9.4 g/dL — ABNORMAL LOW (ref 13.0–17.0)
MCH: 28.5 pg (ref 26.0–34.0)
MCHC: 33.2 g/dL (ref 30.0–36.0)
MCV: 85.8 fL (ref 80.0–100.0)
Platelets: 325 10*3/uL (ref 150–400)
RBC: 3.3 MIL/uL — ABNORMAL LOW (ref 4.22–5.81)
RDW: 14.4 % (ref 11.5–15.5)
WBC: 9.3 10*3/uL (ref 4.0–10.5)
nRBC: 0 % (ref 0.0–0.2)

## 2020-08-26 LAB — BASIC METABOLIC PANEL
Anion gap: 13 (ref 5–15)
BUN: 5 mg/dL — ABNORMAL LOW (ref 8–23)
CO2: 20 mmol/L — ABNORMAL LOW (ref 22–32)
Calcium: 9 mg/dL (ref 8.9–10.3)
Chloride: 106 mmol/L (ref 98–111)
Creatinine, Ser: 1.06 mg/dL (ref 0.61–1.24)
GFR, Estimated: >60 mL/min (ref >60)
Glucose, Bld: 118 mg/dL — ABNORMAL HIGH (ref 70–99)
Potassium: 3.2 mmol/L — ABNORMAL LOW (ref 3.5–5.1)
Sodium: 139 mmol/L (ref 135–145)

## 2020-08-26 LAB — MAGNESIUM: Magnesium: 1.3 mg/dL — ABNORMAL LOW (ref 1.7–2.4)

## 2020-08-26 LAB — GLUCOSE, CAPILLARY: Glucose-Capillary: 110 mg/dL — ABNORMAL HIGH (ref 70–99)

## 2020-08-26 MED ORDER — ADULT MULTIVITAMIN W/MINERALS CH: 1.0000 | ORAL_TABLET | Freq: Every day | ORAL | Status: AC

## 2020-08-26 MED ORDER — ACETAMINOPHEN 325 MG PO TABS: ORAL_TABLET | ORAL | 2 refills | Status: AC

## 2020-08-26 MED ORDER — OXYCODONE HCL 5 MG PO TABS
5.0000 mg | ORAL_TABLET | ORAL | 0 refills | Status: AC | PRN
Start: 2020-08-26 — End: ?

## 2020-08-26 MED ORDER — MAGNESIUM OXIDE 400 (241.3 MG) MG PO TABS
400.0000 mg | ORAL_TABLET | Freq: Two times a day (BID) | ORAL | Status: DC
  Administered 2020-08-26: 400 mg via ORAL
  Filled 2020-08-26: qty 1

## 2020-08-26 MED ORDER — MAGNESIUM SULFATE 2 GM/50ML IV SOLN
2.0000 g | Freq: Once | INTRAVENOUS | Status: DC
  Filled 2020-08-26 (×2): qty 50

## 2020-08-26 MED ORDER — MAGNESIUM OXIDE 400 (241.3 MG) MG PO TABS: 400.0000 mg | ORAL_TABLET | Freq: Two times a day (BID) | ORAL | 0 refills | Status: AC

## 2020-08-26 MED ORDER — POTASSIUM CHLORIDE CRYS ER 20 MEQ PO TBCR
20.0000 meq | EXTENDED_RELEASE_TABLET | Freq: Two times a day (BID) | ORAL | Status: DC
  Administered 2020-08-26: 20 meq via ORAL
  Filled 2020-08-26: qty 1

## 2020-08-26 NOTE — Evaluation (Signed)
Physical Therapy Evaluation Patient Details Name: Jeremiah Bowman MRN: [id number] DOB: 1956/02/04 Today's Date: 08/26/2020   History of Present Illness  Jeremiah Bowman is a 64 y.o. male with medical history significant of DM2, HTN, prior SBOs of unclear cause. Presents to ED with N/V and diffuse lower abdominal pain. Underwent laparoscopy with small bowel resection on 10/2.  Clinical Impression  Pt admitted with above diagnosis. Pt planning to d/c home later today and very glad. Ambulated 500' without AD and took 2 standing rest breaks. Reviewed proper posture as well as activity level recs for home. Wife present for session.  Pt currently with functional limitations due to the deficits listed below (see PT Problem List). Pt will benefit from skilled PT to increase their independence and safety with mobility to allow discharge to the venue listed below.       Follow Up Recommendations No PT follow up    Equipment Recommendations  None recommended by PT    Recommendations for Other Services       Precautions / Restrictions Precautions Precautions: Fall Restrictions Weight Bearing Restrictions: No      Mobility  Bed Mobility               General bed mobility comments: pt received in chair  Transfers Overall transfer level: Modified independent   Transfers: Sit to/from Stand Sit to Stand: Modified independent (Device/Increase time)         General transfer comment: pt stood from chair without difficulty and no AD  Ambulation/Gait Ambulation/Gait assistance: Modified independent (Device/Increase time) Gait Distance (Feet): 500 Feet   Gait Pattern/deviations: Step-through pattern Gait velocity: decreased Gait velocity interpretation: 1.31 - 2.62 ft/sec, indicative of limited community ambulator General Gait Details: mildly antaligic pattern. No AD needed today. Took 2 standing rest breaks.   Stairs            Wheelchair Mobility    Modified Rankin (Stroke  Patients Only)       Balance Overall balance assessment: No apparent balance deficits (not formally assessed)                                           Pertinent Vitals/Pain Pain Assessment: Faces Faces Pain Scale: Hurts little more Pain Location: abdomen Pain Descriptors / Indicators: Discomfort Pain Intervention(s): Monitored during session    Home Living                        Prior Function                 Hand Dominance        Extremity/Trunk Assessment                Communication      Cognition Arousal/Alertness: Awake/alert Behavior During Therapy: WFL for tasks assessed/performed Overall Cognitive Status: Within Functional Limits for tasks assessed                                        General Comments General comments (skin integrity, edema, etc.): discussed posture again as well as scar mgmt when incision has healed.     Exercises     Assessment/Plan    PT Assessment    PT Problem List  PT Treatment Interventions      PT Goals (Current goals can be found in the Care Plan section)  Acute Rehab PT Goals Patient Stated Goal: return to home and work PT Goal Formulation: With patient Time For Goal Achievement: 09/02/20 Potential to Achieve Goals: Good    Frequency Min 3X/week   Barriers to discharge        Co-evaluation               AM-PAC PT "6 Clicks" Mobility  Outcome Measure Help needed turning from your back to your side while in a flat bed without using bedrails?: None Help needed moving from lying on your back to sitting on the side of a flat bed without using bedrails?: None Help needed moving to and from a bed to a chair (including a wheelchair)?: None   Help needed to walk in hospital room?: None Help needed climbing 3-5 steps with a railing? : A Little 6 Click Score: 19    End of Session   Activity Tolerance: Patient tolerated treatment well Patient  left: with call bell/phone within reach;in chair;with family/visitor present Nurse Communication: Mobility status PT Visit Diagnosis: Difficulty in walking, not elsewhere classified (R26.2);Pain Pain - part of body:  (abdomen)    Time: 1761-6073 PT Time Calculation (min) (ACUTE ONLY): 16 min   Charges:     PT Treatments $Gait Training: 8-22 mins        Leighton Roach, [place]  Pager [phone] Office [place] 08/26/2020, 1:49 PM

## 2020-08-26 NOTE — Progress Notes (Signed)
At 1315 -43   Wife educated on how to change pt's abd dressing/wound care.  Demonstrated care to wife and pt, then pt's wife did return demonstration doing the dressing/packing wound, expressed the importance of packing the saline wet dressing down in the wound well so not to leave space open to allow "'pus pocket"/abscess" to form, packing well will let the granulation tissue to form and fill in the wound from the bottom out to the surface of pt's abdomen. Pt and wife verbalized understaanding.  Pt provided with wound care supplies.   At 1425   Discharge instructions reviewed/discussed with pt and wife.  Copy of instructions given to pt/wife, and informed scripts sent electronically to his pharmacy.  Pt d/c'd via wheelchair with belongings, with his wife.           Escorted by hospital volunteer.

## 2020-08-26 NOTE — Discharge Summary (Addendum)
Physician Discharge Summary  Patient ID: Winthrop Shannahan MRN: [id number] DOB/AGE: 65-10-1956 65 y.o.  Admit date: 08/13/2020 Discharge date: 08/26/2020  Admission Diagnoses:  SBO History of alcohol dependency Hx hypertension Acute kidney injury Diabetes, type II   Discharge Diagnoses: Recurrent SBO Postop ileus DM-2 -  Hypertension AKI - resolved Hypokalemia Hypomagnesemia BMI 31.07    Principal Problem:   SBO (small bowel obstruction) (HCC) Active Problems:   Alcohol dependency ([place])   HTN (hypertension)   Acute kidney injury ([place])   Diabetes mellitus with complication ([place])   PROCEDURES: Diagnostic laparoscopy with lysis of adhesions, laparotomy with lysis of adhesions, small bowel resection, 08/17/2020 Dr. Berneice Gandy Course:  Earsel Shouse is a 64 y.o. w/ a hx of HTN, HLD, DM2 and prior SBO who presented with abdominal pain, n/v.  Patient reports 4 days ago he began having diffuse, crampy abdominal pain that was worse in the lower abdomen.  Reports associated nausea that is constant as well as multiple episodes of emesis. No BM or flatus for 3 days. His pain has gradually worsened since onset. He feels symptoms are similar to when he had an SBO in the past.  Patient has been seen by our service for multiple SBO's in the past are all managed nonoperatively (10/24/2015, 05/13/2017, and 10/18/2017).  He had a EGD 10/20/17 that showed severe ulcerative esophagitis but was otherwise unremarkable. He underwent capsule endoscopy 01/31/18 that was negative. He underwent colonoscopy 03/01/2018 by Dr. Ardis Hughs that showed two polyps but was otherwise negative. He has never had any abdominal surgeries.    Today his CT A/P showed small bowel obstruction with transition zone in the left lower quadrant. He was also found to have AKI and electrolyte abnormalities likely 2/2 dehydration. [facility] admitted. We were asked to see.   He was admitted on 08/14/2020, and placed on bowel rest,  NG decompression, IV fluid hydration.  By 08/17/2020, he had not resolved his SBO.  He has had multiple SBO's as listed above.  At that point Dr. Donne Hazel recommended laparoscopy and possible exploratory laparotomy with possible small bowel resection.  He was taken the OR and underwent that procedure as listed above.  He tolerated the procedure well.  He had a prolonged ileus, but it finally opened up.  He developed some drainage from the lower portion of his midline surgical wound on 08/25/2020.  Lower half the staples were removed, a hematoma was found it under the hematoma was a fair amount of purulent fluid.  This was cleaned and packed wet-to-dry.  The following a.m. the remaining staples were removed and another small area was opened.  The port sites are all healing nicely.  He is tolerating soft diet and having bowel movements.  Home health was arranged and his wife has been instructed how to do the dressing change.  By 08/26/2020, it was Dr. Dois Davenport opinion he was ready for discharge.  During his hospitalization his blood pressures been mildly elevated, even after he went back on his preadmission blood pressure medications.  We will not change these but recommend follow-up with his PCP in 1 to 2 weeks.  Glucose has been well controlled during his hospitalization.  We also replace potassium and magnesium.  His central line was removed before the last dose of IV magnesium was given.  We are discharging him on some oral magnesium oxide.   CBC Latest Ref Rng & Units 08/26/2020 08/22/2020 08/19/2020  WBC 4.0 - 10.5 K/uL 9.3 10.8(H) 10.4  Hemoglobin  13.0 - 17.0 g/dL 9.4(L) 9.3(L) 10.4(L)  Hematocrit 39 - 52 % 28.3(L) 27.8(L) 31.0(L)  Platelets 150 - 400 K/uL 325 285 239   CMP Latest Ref Rng & Units 08/26/2020 08/24/2020 08/23/2020  Glucose 70 - 99 mg/dL 118(H) 115(H) 174(H)  BUN 8 - 23 mg/dL 5(L) 7(L) <5(L)  Creatinine 0.61 - 1.24 mg/dL 1.06 1.03 1.08  Sodium 135 - 145 mmol/L 139 142 140   Potassium 3.5 - 5.1 mmol/L 3.2(L) 3.8 3.9  Chloride 98 - 111 mmol/L 106 112(H) 111  CO2 22 - 32 mmol/L 20(L) 19(L) 16(L)  Calcium 8.9 - 10.3 mg/dL 9.0 8.4(L) 9.0  Total Protein 6.5 - 8.1 g/dL - - 6.8  Total Bilirubin 0.3 - 1.2 mg/dL - - 1.1  Alkaline Phos 38 - 126 U/L - - 63  AST 15 - 41 U/L - - 20  ALT 0 - 44 U/L - - 20    Condition on discharge: Improving   Disposition: Discharge disposition: 01-Home or Self Care        Allergies as of 08/26/2020      Reactions   Malarone [atovaquone-proguanil Hcl] Itching      Medication List    TAKE these medications   acetaminophen 325 MG tablet Commonly known as: Tylenol You can take 2 tablets every 6 hours as needed for pain.  Do not take more than 4000 mg of Tylenol per day.  You can buy this over-the-counter at any drugstore.   aspirin 81 MG chewable tablet Chew 81 mg by mouth daily.   cyclobenzaprine 5 MG tablet Commonly known as: FLEXERIL Take 5 mg by mouth 3 (three) times daily as needed for muscle spasms.   ibuprofen 800 MG tablet Commonly known as: ADVIL Take 800 mg by mouth in the morning and at bedtime.   lisinopril-hydrochlorothiazide 20-12.5 MG tablet Commonly known as: ZESTORETIC Take 1 tablet by mouth 2 (two) times daily.   magnesium oxide 400 (241.3 Mg) MG tablet Commonly known as: MAG-OX Take 1 tablet (400 mg total) by mouth 2 (two) times daily.   metFORMIN 1000 MG tablet Commonly known as: GLUCOPHAGE Take 1,000 mg by mouth 2 (two) times daily with a meal.   multivitamin with minerals Tabs tablet Take 1 tablet by mouth daily. Start taking on: August 27, 2020   NEEDLE (DISP) 30 G 30G X 1/2" Misc Commonly known as: BD Disp Needles 10 Units by Does not apply route daily.   oxyCODONE 5 MG immediate release tablet Commonly known as: Oxy IR/ROXICODONE Take 1 tablet (5 mg total) by mouth every 4 (four) hours as needed for breakthrough pain.   pantoprazole 40 MG tablet Commonly known as:  PROTONIX Take 1 tablet (40 mg total) by mouth 2 (two) times daily before a meal.   pravastatin 20 MG tablet Commonly known as: PRAVACHOL Take 20 mg by mouth daily.   solifenacin 10 MG tablet Commonly known as: VESICARE Take 10 mg by mouth daily.   verapamil 180 MG CR tablet Commonly known as: CALAN-SR Take 1 tablet by mouth daily.       Follow-up Information    Everardo Beals, NP Follow up on 09/03/2020.   Why: 2:00pm appointment for hospital follow up. Please wear a mask, call to cancel or reschedule.  check your blood pressure at home 2-3 times per day and record.  Take with you for follow up.  Contact information: [place] [address] [phone]        Rolm Bookbinder,  MD Follow up on 09/05/2020.   Specialty: General Surgery Why: 420Your appointment is at 4:20 PM.  Be at the office 30 minutes early for check in.  Bring photo ID and insurance information.   Contact information: [address] [place] [address] [phone]        [facility] Follow up.   Specialty: Home Health Services Contact information: [address] [place] [address] [phone]               Signed: Earnstine Regal 08/26/2020, 12:45 PM

## 2020-08-26 NOTE — Discharge Instructions (Signed)
[facility]      [facility] °[phone] ° °OPEN ABDOMINAL SURGERY: POST OP INSTRUCTIONS ° °Always review your discharge instruction sheet given to you by the facility where your surgery was performed. ° °IF YOU HAVE DISABILITY OR FAMILY LEAVE FORMS, YOU MUST BRING THEM TO THE OFFICE FOR PROCESSING.  PLEASE DO NOT GIVE THEM TO YOUR DOCTOR. ° °1. A prescription for pain medication may be given to you upon discharge.  Take your pain medication as prescribed, if needed.  If narcotic pain medicine is not needed, then you may take acetaminophen (Tylenol) or ibuprofen (Advil) as needed. °2. Take your usually prescribed medications unless otherwise directed. °3. If you need a refill on your pain medication, please contact your pharmacy. They will contact our office to request authorization.  Prescriptions will not be filled after 5pm or on week-ends. °4. You should follow a light diet the first few days after arrival home, such as soup and crackers, pudding, etc.unless your doctor has advised otherwise. A high-fiber, low fat diet can be resumed as tolerated.   Be sure to include lots of fluids daily. Most patients will experience some swelling and bruising on the chest and neck area.  Ice packs will help.  Swelling and bruising can take several days to resolve °5. Most patients will experience some swelling and bruising in the area of the incision. Ice pack will help. Swelling and bruising can take several days to resolve..  °6. It is common to experience some constipation if taking pain medication after surgery.  Increasing fluid intake and taking a stool softener will usually help or prevent this problem from occurring.  A mild laxative (Milk of Magnesia or Miralax) should be taken according to package directions if there are no bowel movements after 48 hours. °7.  You may have steri-strips (small skin tapes) in place directly over the incision.  These strips should be left on the skin for 7-10 days.  If your  surgeon used skin glue on the incision, you may shower in 24 hours.  The glue will flake off over the next 2-3 weeks.  Any sutures or staples will be removed at the office during your follow-up visit. You may find that a light gauze bandage over your incision may keep your staples from being rubbed or pulled. You may shower and replace the bandage daily. °8. ACTIVITIES:  You may resume regular (light) daily activities beginning the next day--such as daily self-care, walking, climbing stairs--gradually increasing activities as tolerated.  You may have sexual intercourse when it is comfortable.  Refrain from any heavy lifting or straining until approved by your doctor. °a. You may drive when you no longer are taking prescription pain medication, you can comfortably wear a seatbelt, and you can safely maneuver your car and apply brakes °b. Return to Work: ___________________________________ °9. You should see your doctor in the office for a follow-up appointment approximately two weeks after your surgery.  Make sure that you call for this appointment within a day or two after you arrive home to insure a convenient appointment time. °OTHER INSTRUCTIONS:  °_____________________________________________________________ °_____________________________________________________________ ° °WHEN TO CALL YOUR DOCTOR: °1. Fever over 101.0 °2. Inability to urinate °3. Nausea and/or vomiting °4. Extreme swelling or bruising °5. Continued bleeding from incision. °6. Increased pain, redness, or drainage from the incision. °7. Difficulty swallowing or breathing °8. Muscle cramping or spasms. °9. Numbness or tingling in hands or feet or around lips. ° °The clinic staff is available to   answer your questions during regular business hours.  Please dont hesitate to call and ask to speak to one of the nurses if you have concerns.  For further questions, please visit [email or web address]   Mechanical Wound Debridement Clean  skin and open area with soap and water, then pack with wet gauze into the open areas.  Apply a dry dressing over the sites. Do dressing change twice a day.  Mechanical wound debridement is a treatment to remove dead tissue from a wound. This helps the wound heal. The treatment involves cleaning the wound by using mechanical force. This may include:  Wound irrigation. This method involves rinsing the wound out with fluid.  Wet-to-dry dressing. This method involves putting wet dressings into the wound, allowing them to dry, then removing the dry dressings to remove debris and tissue from the wound.  Using a pad or gauze to remove dead tissue and debris from the wound.  Pulsatile lavage. This method involves cleaning the wound with a pressurized stream of fluid. Depending on the wound, you may need to repeat this procedure or change to another form of debridement as your wound starts to heal. Tell a health care provider about:  Any allergies you have.  All medicines you are taking, including vitamins, herbs, eye drops, creams, and over-the-counter medicines.  Any blood disorders you have.  Any medical conditions you have, or have had, especially conditions that cause a decrease in blood flow to the wound area, such as peripheral vascular disease, or conditions that affect your body's defense (immune) system or white blood count.  Any surgeries you have had.  Whether you are pregnant or may be pregnant. What are the risks? Generally, this is a safe procedure. However, problems may occur, including:  Infection.  Bleeding.  Damage to healthy tissue in and around your wound.  Soreness or pain.  Failure of the wound to heal.  Scarring. What happens during the procedure?   Your health care provider may apply a numbing medicine (topical anesthetic) to the wound.  Your health care provider may irrigate your wound with a germ-free salt solution (saline). This helps to loosen or remove  debris, bacteria, and dead tissue.  Depending on the type of mechanical wound debridement you are having, your health care provider may do one of the following: ? Put a dressing on your wound. You may have a dry gauze pad placed into the wound. Your health care provider will remove the gauze after the wound is dry. Any dead tissue and debris that has dried into the gauze will be lifted out of the wound (wet-to-dry debridement). ? Use a type of pad (monofilament fiber debridement pad) that has a fluffy surface on one side that picks up dead tissue and debris from your wound. Your health care provider wets the pad and wipes it over your wound for several minutes. ? Flush (irrigate) your wound with a pressurized stream of solution such as saline or water (pulsatile lavage).  Once your health care provider is finished, he or she may apply a light dressing to your wound. The procedure may vary among health care providers and hospitals. What happens after the procedure?  You may get medicine for pain. Summary  Mechanical wound debridement is a treatment to remove dead tissue from a wound. This helps the wound heal.  Depending on the wound, you may need to repeat this procedure or change to another form of debridement as your wound starts to heal.  This treatment may  include: wound irrigation, wet-to-dry dressing, using a pad or gauze to remove dead tissue and debris from the wound, or pulsatile lavage. This information is not intended to replace advice given to you by your health care provider. Make sure you discuss any questions you have with your health care provider. Document Revised: 10/10/2018 Document Reviewed: 10/10/2018 Elsevier Patient Education  2020 [employer].

## 2020-08-26 NOTE — TOC Initial Note (Signed)
Transition of Care [facility]) - Initial/Assessment Note    Patient Details  Name: Jeremiah Bowman MRN: [id number] Date of Birth: [birth year]  [facility] CM/SW Contact:    Marilu Favre, RN Phone Number: 08/26/2020, 9:49 AM  Clinical Narrative:                 Patient confirmed face sheet information. Patient from home with wife.   Patient has [place] order. Patient aware bedside nurse will provide wound care teaching to him and his wife prior to discharge, due to [facility] will not be able to make daily visits.   Patient had no preference in home health agency. NCM called [place] with [facility] and she accepted referral.  Expected Discharge Plan: [place] Barriers to Discharge: No Barriers Identified   Patient Goals and CMS Choice Patient states their goals for this hospitalization and ongoing recovery are:: to return tohome CMS Medicare.gov Compare Post Acute Care list provided to:: Patient Choice offered to / list presented to : Patient  Expected Discharge Plan and Services Expected Discharge Plan: [place] In-house Referral: Clinical Social Work Discharge Planning Services: CM Consult Post Acute Care Choice: [place] arrangements for the past 2 months: Apartment                 DME Arranged: N/A DME Agency: NA       HH Arranged: RN [place] Agency: [facility] Date [place]: 08/26/20 Time [place]: [phone] Representative spoke with at [place]: [place] Arrangements/Services Living arrangements for the past 2 months: [place] with:: Spouse Patient language and need for interpreter reviewed:: Yes Do you feel safe going back to the place where you live?: Yes      Need for Family Participation in Patient Care: Yes (Comment) Care giver support system in place?: Yes (comment)   Criminal Activity/Legal Involvement Pertinent to Current Situation/Hospitalization: No - Comment as  needed  Activities of Daily Living      Permission Sought/Granted Permission sought to share information with : Family Supports, PCP Permission granted to share information with : No  Share Information with NAME: Linken Mcglothen  Permission granted to share info w AGENCY: PCP  Permission granted to share info w Relationship: wife     Emotional Assessment Appearance:: Appears stated age Attitude/Demeanor/Rapport: Engaged Affect (typically observed): Accepting Orientation: : Oriented to Self, Oriented to Place, Oriented to  Time, Oriented to Situation Alcohol / Substance Use: Not Applicable Psych Involvement: No (comment)  Admission diagnosis:  Small bowel obstruction (HCC) [K56.609] SBO (small bowel obstruction) ([place]) [K56.609] AKI (acute kidney injury) ([place]) [N17.9] Patient Active Problem List   Diagnosis Date Noted  . Melena   . Acute esophagitis   . Uncontrolled type 2 diabetes mellitus with complication ([place])   . Hypokalemia   . DKA (diabetic ketoacidoses) 05/13/2017  . Dehydration   . Diabetes mellitus with complication ([place])   . Partial small bowel obstruction ([place])   . SBO (small bowel obstruction) ([place])   . Abdominal pain 10/24/2015  . Nausea and vomiting 10/24/2015  . Severe sepsis ([place]) 10/24/2015  . Alcohol dependency ([place]) 10/24/2015  . Diabetes mellitus ([place]) 10/24/2015  . HTN (hypertension) 10/24/2015  . Hyperlipidemia 10/24/2015  . Acute urinary retention 10/24/2015  . Acute renal failure (ARF) ([place]) 10/24/2015  . Sepsis ([place]) 10/24/2015  . Acute kidney injury ([place])   . Lactic acidosis    PCP:  Everardo Beals, NP Pharmacy:   [facility] [address] [place] [address] Phone: [phone] Fax: [phone]     Social Determinants of Health (SDOH) Interventions    Readmission Risk Interventions Readmission Risk Prevention Plan 08/15/2020  Post Dischage Appt Complete   Medication Screening Complete  Transportation Screening Complete  Some recent data might be hidden

## 2020-08-26 NOTE — Progress Notes (Addendum)
9 Days Post-Op    CC: abdominal pain  Subjective: He is tolerating diet well and having BM's.  His open wound is clean, I took out the remaining staples and another area near the umbilicus did open.  Both are clean.    Objective: Vital signs in last 24 hours: Temp:  [98.7 F (37.1 C)-99.1 F (37.3 C)] 98.7 F (37.1 C) (10/11 0421) Pulse Rate:  [93-103] 96 (10/11 0421) Resp:  [16-20] 16 (10/11 0421) BP: (143-164)/(87-92) 144/87 (10/11 0421) SpO2:  [99 %-100 %] 100 % (10/11 0421) Last BM Date: 08/25/20 1050 urine BM x 1  No intake recorded Afebrile, VSS  BP still up K+ 3.2/Mag pending WBC 9.3 Intake/Output from previous day: 10/10 0701 - 10/11 0700 In: -  Out: 1050 [Urine:1050] Intake/Output this shift: Total I/O In: -  Out: 350 [Urine:350]  General appearance: alert, cooperative and no distress Resp: clear to auscultation bilaterally GI: large abdomin, + BS, tolerating diet and having BM's; 1 episode of diarrhea yesterday.   Lab Results:  Recent Labs    08/26/20 0402  WBC 9.3  HGB 9.4*  HCT 28.3*  PLT 325    BMET Recent Labs    08/24/20 0342 08/26/20 0402  NA 142 139  K 3.8 3.2*  CL 112* 106  CO2 19* 20*  GLUCOSE 115* 118*  BUN 7* 5*  CREATININE 1.03 1.06  CALCIUM 8.4* 9.0   PT/INR No results for input(s): LABPROT, INR in the last 72 hours.  Recent Labs  Lab 08/23/20 0330  AST 20  ALT 20  ALKPHOS 63  BILITOT 1.1  PROT 6.8  ALBUMIN 2.6*     Lipase     Component Value Date/Time   LIPASE 25 08/13/2020 0958     Medications:  acetaminophen  1,000 mg Oral Q8H   Chlorhexidine Gluconate Cloth  6 each Topical Daily   darifenacin  15 mg Oral Daily   feeding supplement  1 Container Oral TID BM   folic acid  1 mg Oral Daily   heparin  5,000 Units Subcutaneous Q8H   lisinopril  20 mg Oral BID   And   hydrochlorothiazide  12.5 mg Oral BID   insulin aspart  0-9 Units Subcutaneous Q4H   metFORMIN  1,000 mg Oral BID WC    methocarbamol  500 mg Oral TID   multivitamin with minerals  1 tablet Oral Daily   pantoprazole (PROTONIX) IV  40 mg Intravenous Q12H   pravastatin  20 mg Oral Daily   thiamine  100 mg Oral Daily   Or   thiamine  100 mg Intravenous Daily   verapamil  180 mg Oral Daily    Assessment/Plan DM-2 - SSI HTN - home meds, IV metoprolol PRN AKI - resolved Hypokalemia -K+ 3.1>>3.5>>3.7>>3.2 - will replace -Magnesium 1.8>>1.5>> 1.3 - will replace - adding KCL to IV  Small bowelobstruction S/pDiagnostic laparoscopy with lysis of adhesions, laparotomy with lysis of adhesions,Small bowel resection10/2 Dr. Donne Hazel -POD#9 - Persistent ileus, - Mobilize  ID -ancef periop FEN -IVF/Fulls>> carb Mod VTE -SCDs, sq heparin Foley -out 10/3 Follow up -Dr. Donne Hazel    Plan:  I think we can let him go later today, his wife needs instruction on wound care.  I ask for home health to be set up yesterday.   Will give him some oral K+ this AM to replace K+, mag is pending.  Mag 1.3 will replace this AM.    LOS: 12 days    Brittne Kawasaki 08/26/2020  Please see Amion

## 2020-10-15 ENCOUNTER — Ambulatory Visit (INDEPENDENT_AMBULATORY_CARE_PROVIDER_SITE_OTHER): Payer: Medicare HMO | Admitting: Gastroenterology

## 2020-10-15 ENCOUNTER — Encounter: Payer: Self-pay | Admitting: Gastroenterology

## 2020-10-15 VITALS — BP 110/68 | HR 80 | Ht 64.5 in | Wt 188.2 lb

## 2020-10-15 DIAGNOSIS — R109 Unspecified abdominal pain: Secondary | ICD-10-CM | POA: Diagnosis not present

## 2020-10-15 NOTE — Patient Instructions (Addendum)
If you are age 65 or older, your body mass index should be between 23-30. Your Body mass index is 31.81 kg/m. If this is out of the aforementioned range listed, please consider follow up with your Primary Care Provider.  If you are age 13 or younger, your body mass index should be between 19-25. Your Body mass index is 31.81 kg/m. If this is out of the aformentioned range listed, please consider follow up with your Primary Care Provider.    We have sent referral to [facility]. Dr. Casey Burkitt. Their office will contact you for an appointment. If you do not hear from them  In 1 week, please contact them at [phone]  Thank you for choosing me and [facility].  Dr.Daniel Ardis Hughs

## 2020-10-15 NOTE — Progress Notes (Signed)
Review of pertinent gastrointestinal problems: 1.  Recurrent small bowel obstruction, unclear etiology.  In June and December 2018 he had acute abdominal pain with small bowel obstruction on CT scan with a transition point in the pelvis.  Radiologist felt this was most consistent with adhesive disease.  Symptoms improved with conservative management both times. CT enterography 10/2017 no clear etiology.  Capsule endoscopy March 2019 was completely normal, good quality study.  Small bowel follow-through February 2019 completely normal.  Small bowel obstruction September 2021 with transition zone left lower quadrant, radiologist felt it was adhesive related.  Eventual surgery, laparoscopic to open, Dr. Donne Hazel.  He performed lysis of adhesions and small bowel resection at 2 sites.  Surgery was complicated by postoperative ileus.  Unclear etiology, the pathologic specimen showed small bowel necrosis but no clear evidence of what caused it, no obvious signs of inflammatory bowel disease.  A total of 69 cm of small bowel was resected. 2.  EGD December 2018 after small bowel obstruction resolution he had some GI bleeding.  EGD revealed severe ulcerative esophagitis. 3.  Adenomatous colon polyps.  Colonoscopy April 2019 found 2 subcentimeter adenomas.  Also hemorrhoids.  HPI: This is a very pleasant 64 year old man.  I last saw him about 2-1/2 years ago while working up his intermittent small bowel obstructions.  He had extensive work-up bleeding CT scan, CT enterography, capsule endoscopy, small bowel follow-through, colonoscopy, upper endoscopy.  No clear etiology, radiology felt his bowel obstructions were related to adhesive disease.  He had another event of small bowel obstruction and this time he required surgical resection.  The surgeon did not see clear signs of inflammatory bowel disease he does describe some dusky intestine that was not wadded up in his pelvis.  Pathologic specimen also did not show  clear etiology.  He has lost a total of 69 cm of small bowel from that operation.  Fortunately he shows no sign of short gut syndrome.  His weight is stable and he is having solid stools.   ROS: complete GI ROS as described in HPI, all other review negative.  Constitutional:  No unintentional weight loss   Past Medical History:  Diagnosis Date  . Acute kidney injury ([place])   . Diabetes ([place])   . DKA (diabetic ketoacidoses)   . Hyperlipidemia   . Hypertension   . Small bowel obstruction ([place]) 10/2015    Past Surgical History:  Procedure Laterality Date  . BOWEL RESECTION N/A 08/17/2020   Procedure: SMALL BOWEL RESECTION;  Surgeon: Rolm Bookbinder, MD;  Location: [place];  Service: General;  Laterality: N/A;  . ESOPHAGOGASTRODUODENOSCOPY N/A 10/20/2017   Procedure: ESOPHAGOGASTRODUODENOSCOPY (EGD);  Surgeon: Milus Banister, MD;  Location[facility] ENDOSCOPY;  Service: Endoscopy;  Laterality: N/A;  . ESOPHAGOGASTRODUODENOSCOPY ENDOSCOPY    . GIVENS CAPSULE STUDY N/A 01/31/2018   Procedure: GIVENS CAPSULE STUDY;  Surgeon: Milus Banister, MD;  Location: [facility] ENDOSCOPY;  Service: Endoscopy;  Laterality: N/A;  . LAPAROSCOPY N/A 08/17/2020   Procedure: LAPAROSCOPY DIAGNOSTIC;  Surgeon: Rolm Bookbinder, MD;  Location: [place];  Service: General;  Laterality: N/A;  . LAPAROTOMY N/A 08/17/2020   Procedure: EXPLORATORY LAPAROTOMY;  Surgeon: Rolm Bookbinder, MD;  Location: [place];  Service: General;  Laterality: N/A;  . LYSIS OF ADHESION N/A 08/17/2020   Procedure: LYSIS OF ADHESION;  Surgeon: Rolm Bookbinder, MD;  Location: [place];  Service: General;  Laterality: N/A;  . TONSILLECTOMY     patient denies  . WRIST SURGERY  Current Outpatient Medications  Medication Sig Dispense Refill  . acetaminophen (TYLENOL) 325 MG tablet You can take 2 tablets every 6 hours as needed for pain.  Do not take more than 4000 mg of Tylenol per day.  You can buy this over-the-counter at any drugstore. 100  tablet 2  . aspirin 81 MG chewable tablet Chew 81 mg by mouth daily.    . cyclobenzaprine (FLEXERIL) 5 MG tablet Take 5 mg by mouth 3 (three) times daily as needed for muscle spasms.    Marland Kitchen ibuprofen (ADVIL) 800 MG tablet Take 800 mg by mouth in the morning and at bedtime.    Marland Kitchen lisinopril-hydrochlorothiazide (ZESTORETIC) 20-12.5 MG tablet Take 1 tablet by mouth 2 (two) times daily.    . magnesium oxide (MAG-OX) 400 (241.3 Mg) MG tablet Take 1 tablet (400 mg total) by mouth 2 (two) times daily. 6 tablet 0  . metFORMIN (GLUCOPHAGE) 1000 MG tablet Take 1,000 mg by mouth 2 (two) times daily with a meal.    . Multiple Vitamin (MULTIVITAMIN WITH MINERALS) TABS tablet Take 1 tablet by mouth daily.    Marland Kitchen NEEDLE, DISP, 30 G (BD DISP NEEDLES) 30G X 1/2" MISC 10 Units by Does not apply route daily. 200 each 0  . oxyCODONE (OXY IR/ROXICODONE) 5 MG immediate release tablet Take 1 tablet (5 mg total) by mouth every 4 (four) hours as needed for breakthrough pain. 20 tablet 0  . pantoprazole (PROTONIX) 40 MG tablet Take 1 tablet (40 mg total) by mouth 2 (two) times daily before a meal. 60 tablet 0  . pravastatin (PRAVACHOL) 20 MG tablet Take 20 mg by mouth daily.    . solifenacin (VESICARE) 10 MG tablet Take 10 mg by mouth daily.    . verapamil (CALAN-SR) 180 MG CR tablet Take 1 tablet by mouth daily.     No current facility-administered medications for this visit.    Allergies as of 10/15/2020 - Review Complete 10/15/2020  Allergen Reaction Noted  . Malarone [atovaquone-proguanil hcl] Itching 10/18/2017    Family History  Problem Relation Age of Onset  . Hypertension Mother   . Hypertension Father     Social History   Socioeconomic History  . Marital status: Married    Spouse name: Not on file  . Number of children: Not on file  . Years of education: Not on file  . Highest education level: Not on file  Occupational History  . Not on file  Tobacco Use  . Smoking status: Former Research scientist (life sciences)  .  Smokeless tobacco: Never Used  Vaping Use  . Vaping Use: Never used  Substance and Sexual Activity  . Alcohol use: Yes    Alcohol/week: 16.0 standard drinks    Types: 2 Cans of beer, 14 Shots of liquor per week  . Drug use: No  . Sexual activity: Not on file  Other Topics Concern  . Not on file  Social History Narrative  . Not on file   Social Determinants of Health   Financial Resource Strain:   . Difficulty of Paying Living Expenses: Not on file  Food Insecurity:   . Worried About Charity fundraiser in the Last Year: Not on file  . Ran Out of Food in the Last Year: Not on file  Transportation Needs:   . Lack of Transportation (Medical): Not on file  . Lack of Transportation (Non-Medical): Not on file  Physical Activity:   . Days of Exercise per Week: Not on file  .  Minutes of Exercise per Session: Not on file  Stress:   . Feeling of Stress : Not on file  Social Connections:   . Frequency of Communication with Friends and Family: Not on file  . Frequency of Social Gatherings with Friends and Family: Not on file  . Attends Religious Services: Not on file  . Active Member of Clubs or Organizations: Not on file  . Attends Archivist Meetings: Not on file  . Marital Status: Not on file  Intimate Partner Violence:   . Fear of Current or Ex-Partner: Not on file  . Emotionally Abused: Not on file  . Physically Abused: Not on file  . Sexually Abused: Not on file     Physical Exam: Ht 5' 4.5" (1.638 m) Comment: height measured without shoes  Wt 188 lb 4 oz (85.4 kg)   BMI 31.81 kg/m  Constitutional: generally well-appearing Psychiatric: alert and oriented x3 Abdomen: soft, nontender, nondistended, no obvious ascites, no peritoneal signs, normal bowel sounds No peripheral edema noted in lower extremities  Assessment and plan: 64 y.o. male with recurrent small bowel obstruction, recent resection of 69 cm of small bowel  Still unclear etiology.  I wonder if  this is very atypical inflammatory bowel disease.  Hopefully he will never have another event like this but if he does and he loses another section of small bowel he will certainly be at risk for short gut syndrome.  I am going to arrange for referral to [facility] Dr. Maryelizabeth Kaufmann, to see what he thinks about the possibility of atypical Crohn's disease or if he has any other ideas of what might have caused these intermittent obstructions leading up to eventual surgical resection.   Please see the "Patient Instructions" section for addition details about the plan.  Owens Loffler, MD [facility] 10/15/2020, 3:27 PM   Total time on date of encounter was 25 minutes (this included time spent preparing to see the patient reviewing records; obtaining and/or reviewing separately obtained history; performing a medically appropriate exam and/or evaluation; counseling and educating the patient and family if present; ordering medications, tests or procedures if applicable; and documenting clinical information in the health record).

## 2022-12-24 ENCOUNTER — Emergency Department (HOSPITAL_COMMUNITY)
Admission: EM | Admit: 2022-12-24 | Discharge: 2022-12-24 | Disposition: A | Discharge status: Home / Self Care | Payer: Medicare HMO | Attending: Emergency Medicine | Admitting: Emergency Medicine

## 2022-12-24 ENCOUNTER — Encounter (HOSPITAL_COMMUNITY): Payer: Self-pay

## 2022-12-24 ENCOUNTER — Other Ambulatory Visit: Payer: Self-pay

## 2022-12-24 DIAGNOSIS — Z79899 Other long term (current) drug therapy: Secondary | ICD-10-CM | POA: Insufficient documentation

## 2022-12-24 DIAGNOSIS — Z7982 Long term (current) use of aspirin: Secondary | ICD-10-CM | POA: Insufficient documentation

## 2022-12-24 DIAGNOSIS — I10 Essential (primary) hypertension: Secondary | ICD-10-CM | POA: Insufficient documentation

## 2022-12-24 DIAGNOSIS — Z7984 Long term (current) use of oral hypoglycemic drugs: Secondary | ICD-10-CM | POA: Insufficient documentation

## 2022-12-24 DIAGNOSIS — R531 Weakness: Secondary | ICD-10-CM | POA: Insufficient documentation

## 2022-12-24 DIAGNOSIS — E119 Type 2 diabetes mellitus without complications: Secondary | ICD-10-CM | POA: Insufficient documentation

## 2022-12-24 DIAGNOSIS — Y92009 Unspecified place in unspecified non-institutional (private) residence as the place of occurrence of the external cause: Secondary | ICD-10-CM | POA: Diagnosis not present

## 2022-12-24 DIAGNOSIS — W19XXXA Unspecified fall, initial encounter: Secondary | ICD-10-CM

## 2022-12-24 DIAGNOSIS — W1830XA Fall on same level, unspecified, initial encounter: Secondary | ICD-10-CM | POA: Diagnosis not present

## 2022-12-24 LAB — COMPREHENSIVE METABOLIC PANEL
ALT: 20 U/L (ref 0–44)
AST: 36 U/L (ref 15–41)
Albumin: 3.7 g/dL (ref 3.5–5.0)
Alkaline Phosphatase: 71 U/L (ref 38–126)
Anion gap: 10 (ref 5–15)
BUN: 15 mg/dL (ref 8–23)
CO2: 25 mmol/L (ref 22–32)
Calcium: 9.6 mg/dL (ref 8.9–10.3)
Chloride: 100 mmol/L (ref 98–111)
Creatinine, Ser: 1.15 mg/dL (ref 0.61–1.24)
GFR, Estimated: >60 mL/min (ref >60)
Glucose, Bld: 147 mg/dL — ABNORMAL HIGH (ref 70–99)
Potassium: 4 mmol/L (ref 3.5–5.1)
Sodium: 135 mmol/L (ref 135–145)
Total Bilirubin: 0.9 mg/dL (ref 0.3–1.2)
Total Protein: 8.4 g/dL — ABNORMAL HIGH (ref 6.5–8.1)

## 2022-12-24 LAB — CBC WITH DIFFERENTIAL/PLATELET
Abs Immature Granulocytes: 0.11 10*3/uL — ABNORMAL HIGH (ref 0.00–0.07)
Basophils Absolute: 0.1 10*3/uL (ref 0.0–0.1)
Basophils Relative: 2 %
Eosinophils Absolute: 0.1 10*3/uL (ref 0.0–0.5)
Eosinophils Relative: 2 %
HCT: 38.3 % — ABNORMAL LOW (ref 39.0–52.0)
Hemoglobin: 13 g/dL (ref 13.0–17.0)
Immature Granulocytes: 2 %
Lymphocytes Relative: 43 %
Lymphs Abs: 3 10*3/uL (ref 0.7–4.0)
MCH: 28.8 pg (ref 26.0–34.0)
MCHC: 33.9 g/dL (ref 30.0–36.0)
MCV: 84.7 fL (ref 80.0–100.0)
Monocytes Absolute: 0.5 10*3/uL (ref 0.1–1.0)
Monocytes Relative: 7 %
Neutro Abs: 3.2 10*3/uL (ref 1.7–7.7)
Neutrophils Relative %: 44 %
Platelets: 294 10*3/uL (ref 150–400)
RBC: 4.52 MIL/uL (ref 4.22–5.81)
RDW: 13.9 % (ref 11.5–15.5)
WBC: 7 10*3/uL (ref 4.0–10.5)
nRBC: 0 % (ref 0.0–0.2)

## 2022-12-24 LAB — MAGNESIUM: Magnesium: 2 mg/dL (ref 1.7–2.4)

## 2022-12-24 LAB — ETHANOL: Alcohol, Ethyl (B): 99 mg/dL — ABNORMAL HIGH (ref <10)

## 2022-12-24 LAB — CBG MONITORING, ED: Glucose-Capillary: 152 mg/dL — ABNORMAL HIGH (ref 70–99)

## 2022-12-24 NOTE — ED Provider Notes (Signed)
Patient is a 67 year old male with a history of diabetes, prior small bowel obstruction, hypertension and regular alcohol use who is presenting today due to feeling weak.  He was was sitting on the floor doing something and tried to get up today and was too weak to get up.  He reports he has not eaten today because he did not have an appetite.  He denies any abdominal pain, vomiting and had no injury from this episode.  He is awake alert and oriented.  He has no abdominal pain on exam.  Will ensure electrolytes are within normal limits.  Patient was given something to eat.  Vital signs are normal.   Blanchie Dessert, MD 12/29/22 1507

## 2022-12-24 NOTE — ED Provider Notes (Signed)
[place] AT [facility] Provider Note   CSN: [id number] Arrival date & time: 12/24/22  1818  History  Chief Complaint  Patient presents with   Lytle Michaels    Jeremiah Bowman is a 67 y.o. male with PMH HTN, diabetes, HLD, SBO status post bowel resection, alcohol dependence presenting with generalized weakness/fall.  He was sitting on the ground about couple hours ago and tried to get up, but was not able to and then sat back down.  He did not actually fall.  He denies any residual pain, did not strike his head, is not currently taking any blood thinners, denies LOC.  He denies any new focal neurological weakness or numbness, vision loss, slurred speech. He did not have any chest pain, dyspnea, palpitations, dizziness, fevers, nausea vomiting or diarrhea.  He did not eat anything today, but did have 1 or 2 shots of brandy this morning.  He endorses subacute weakness for the past few weeks, but states that it is especially bad today.  He is currently taking his lisinopril-HCTZ, verapamil,, Solifenacin, but is not taking oxycodone or Flexeril.       Home Medications Prior to Admission medications   Medication Sig Start Date End Date Taking? Authorizing Provider  acetaminophen (TYLENOL) 325 MG tablet You can take 2 tablets every 6 hours as needed for pain.  Do not take more than 4000 mg of Tylenol per day.  You can buy this over-the-counter at any drugstore. 08/26/20   Earnstine Regal, PA-C  aspirin 81 MG chewable tablet Chew 81 mg by mouth daily.    [provider]  cyclobenzaprine (FLEXERIL) 5 MG tablet Take 5 mg by mouth 3 (three) times daily as needed for muscle spasms.    [provider]  ibuprofen (ADVIL) 800 MG tablet Take 800 mg by mouth in the morning and at bedtime.    [provider]  lisinopril-hydrochlorothiazide (ZESTORETIC) 20-12.5 MG tablet Take 1 tablet by mouth 2 (two) times daily. 07/02/20   [provider]  magnesium  oxide (MAG-OX) 400 (241.3 Mg) MG tablet Take 1 tablet (400 mg total) by mouth 2 (two) times daily. 08/26/20   Earnstine Regal, PA-C  metFORMIN (GLUCOPHAGE) 1000 MG tablet Take 1,000 mg by mouth 2 (two) times daily with a meal.    [provider]  Multiple Vitamin (MULTIVITAMIN WITH MINERALS) TABS tablet Take 1 tablet by mouth daily. 08/27/20   Earnstine Regal, PA-C  NEEDLE, DISP, 30 G (BD DISP NEEDLES) 30G X 1/2" MISC 10 Units by Does not apply route daily. 05/16/17   Allie Bossier, MD  oxyCODONE (OXY IR/ROXICODONE) 5 MG immediate release tablet Take 1 tablet (5 mg total) by mouth every 4 (four) hours as needed for breakthrough pain. 08/26/20   Earnstine Regal, PA-C  pantoprazole (PROTONIX) 40 MG tablet Take 1 tablet (40 mg total) by mouth 2 (two) times daily before a meal. 10/21/17   Colbert Ewing, MD  pravastatin (PRAVACHOL) 20 MG tablet Take 20 mg by mouth daily.    [provider]  solifenacin (VESICARE) 10 MG tablet Take 10 mg by mouth daily. 08/12/20   [provider]  verapamil (CALAN-SR) 180 MG CR tablet Take 1 tablet by mouth daily.    [provider]      Allergies    Malarone [atovaquone-proguanil hcl]    Review of Systems   See HPI  Physical Exam Updated Vital Signs BP 127/75   Pulse 83   Temp 98.6 F (  37 C) (Oral)   Resp 17   SpO2 97%  Physical Exam Constitutional:      General: He is not in acute distress. HENT:     Head: Normocephalic and atraumatic.  Eyes:     Extraocular Movements: Extraocular movements intact.     Pupils: Pupils are equal, round, and reactive to light.  Cardiovascular:     Rate and Rhythm: Normal rate and regular rhythm.     Heart sounds: Normal heart sounds.  Pulmonary:     Effort: Pulmonary effort is normal. No respiratory distress.     Breath sounds: Normal breath sounds.  Abdominal:     General: There is no distension.     Palpations: Abdomen is soft.     Tenderness: There is no abdominal  tenderness.  Musculoskeletal:        General: No swelling.     Cervical back: Normal range of motion. No rigidity or tenderness.     Right lower leg: No edema.     Left lower leg: No edema.  Skin:    General: Skin is warm and dry.  Neurological:     General: No focal deficit present.     Mental Status: He is alert and oriented to person, place, and time. Mental status is at baseline.     Cranial Nerves: No cranial nerve deficit.     Sensory: No sensory deficit.     Motor: No weakness.  Psychiatric:        Mood and Affect: Mood normal.        Behavior: Behavior normal.     ED Results / Procedures / Treatments   Labs (all labs ordered are listed, but only abnormal results are displayed) Labs Reviewed  CBC WITH DIFFERENTIAL/PLATELET - Abnormal; Notable for the following components:      Result Value   HCT 38.3 (*)    Abs Immature Granulocytes 0.11 (*)    All other components within normal limits  COMPREHENSIVE METABOLIC PANEL - Abnormal; Notable for the following components:   Glucose, Bld 147 (*)    Total Protein 8.4 (*)    All other components within normal limits  ETHANOL - Abnormal; Notable for the following components:   Alcohol, Ethyl (B) 99 (*)    All other components within normal limits  CBG MONITORING, ED - Abnormal; Notable for the following components:   Glucose-Capillary 152 (*)    All other components within normal limits  MAGNESIUM    EKG EKG Interpretation  Date/Time:  Thursday December 24 2022 18:34:18 EST Ventricular Rate:  84 PR Interval:  217 QRS Duration: 79 QT Interval:  334 QTC Calculation: 395 R Axis:   21 Text Interpretation: Sinus rhythm Atrial premature complex Borderline prolonged PR interval Low voltage, precordial leads Prolonged QT RESOLVED SINCE PREVIOUS Confirmed by Blanchie Dessert [phone]) on 12/24/2022 6:54:45 PM  Radiology No results found.  Medications Ordered in ED Medications - No data to display  ED Course/ Medical  Decision Making/ A&P                            Medical Decision Making Amount and/or Complexity of Data Reviewed Labs: ordered.   67 year old male with past medical history of HTN, HLD, diabetes, SBO status post bowel resection, alcohol dependence presenting with generalized weakness/fall.  He had no loss of consciousness, head strike, and is not taking any blood thinners, and does not have any focal neurological deficits at  this time.  Do not think head imaging is necessary.  Chief complaint seems more like generalized weakness which is subacute and worsening.  Of note, he is on ACE/HCTZ which can both make him orthostatic.  His Solifenacin can also have side effects of drowsiness and poor coordination.  He additionally had a couple drinks this morning.  Lab work is unremarkable except for ethanol level of 99.  He is able to tolerate p.o. intake and is ambulatory with minimal dizziness.  Think he can be discharged at this time with alcohol cessation counseling and strict return precautions.  He would benefit from follow-up with PCP for possible titration of his blood pressure medications and further management.  Will also place home health PT order at this time to assist with strengthening exercises and safe ambulation at home. Final Clinical Impression(s) / ED Diagnoses Final diagnoses:  Fall in home, initial encounter  Weakness    Rx / DC Orders ED Discharge Orders          [place]        12/24/22 2151    Face-to-face encounter (required for Medicare/Medicaid patients)       Comments: [place] certify that this patient is under my care and that I, or a nurse practitioner or physician's assistant working with me, had a face-to-face encounter that meets the physician face-to-face encounter requirements with this patient on 12/24/2022. The encounter with the patient was in whole, or in part for the following medical condition(s) which is the primary reason for home  health care (List medical condition): dizzy today an alcohol use.  Unable to get up from sitting   12/24/22 2151              Linus Galas, MD 12/24/22 [id number]    Blanchie Dessert, MD 12/29/22 1506

## 2022-12-24 NOTE — ED Notes (Signed)
Unable to obtain IV access or blood work at this time. MD made aware. Unsuccessful with ultrasound, will wait on IV team for access.

## 2022-12-24 NOTE — Discharge Instructions (Signed)
Your lab work looks good today.  Since you are able to walk around and eat and drink, I think we can discharge.  If you start having new dizziness or sudden weakness or numbness anywhere in your body, slurred speech, loss of consciousness, please come back to the ED to be reevaluated.  Otherwise, we will be discharging you with a home health PT referral and you can follow-up with your doctor.

## 2022-12-24 NOTE — ED Triage Notes (Addendum)
Patient BIB [facility] from home. Said he was sitting on the ground tried to get up and fell back down. No LOC. Did not hit head. No blood thinners. Has not had food today.   CBG 187

## 2023-04-05 ENCOUNTER — Encounter: Payer: Self-pay | Admitting: Gastroenterology

## 2023-10-12 ENCOUNTER — Encounter: Payer: Self-pay | Admitting: Physician Assistant

## 2023-10-31 NOTE — Progress Notes (Signed)
Assessment/Plan:   Jeremiah Bowman is a very pleasant 67 y.o. year old RH male with a history of hypertension, hyperlipidemia, alcohol dependence, DM2, prolonged QT vitamin D deficiency, chronic back pain, insomnia, history of recurrent partial SBO, seen today for evaluation of memory loss. MoCA today is 13 /30.  Neurocognitive testing 10/06/2023 at an outside facility ([facility]) yielded a diagnosis of dementia without behavioral disturbance of unclear etiology. MRI brain performed at [facility]  (no images available for review) remarkable for " age advanced supratentorial volume loss most pronounced in the left greater than right temporal and frontal lobes in a configuration that can be seen with primary progressive aphasia ", additional disproportionate atrophy of the midbrain in a configuration that can be seen in progressive supranuclear palsy (although no PSP symptoms reported to pursue DAT at this time) and moderate chronic microvascular disease.    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 He continues to consume ETOH which may exacerbate symptoms.   Dementia of unclear etiology   Start memantine 5 mg, take half dose in the evening for 2 weeks, then take 1 tab twice daily.  Will increase to 10 mg bid if tolerated.  Side effects discussed (QT prolongation) Obtain MRI images of the brain.  Continue vitamin D replenishment Check B12, TSH, B1 Continue to control mood as per PCP Patient is on muscle relaxers on chronic pain medications which may contribute to some of his memory issues. Recommend good control of cardiovascular risk factors.Patient informed of very elevated BP.  Recommend Baby ASA Alcohol cessation counseled [place] up in 3 months   Subjective:   The patient is accompanied by his wife who supplements the history.   How long did patient have memory difficulties?  Patient denies any issues, his wife is concerned for  the last 2 years.  Reports some difficulty remembering new information, conversations and names as well as appointments. "Sometimes I have a hard time recalling the word".  She has kept a journal to document this changes when he denies it.  "He is just not interested in anything ".  He has not been meeting deadlines.  Long-term memory is good.  He watches TV most of the day.  He stopped going to [place] several months ago.  He does not do any brain games repeats oneself?  Endorsed by his wife, within 15 minutes. Disoriented when walking into a room?  Patient denies except occasionally not remembering what patient came to the room for    Leaving objects in unusual places? Denies.   Wandering behavior?  denies .  Any personality changes?  His wife reports that he is not interested in any changes before, he has more apathy, decreased motivation. "He used to do things as a family and he is not interested in socializing" Any history of depression?:  Denies, but his wife suspecting he does.    Hallucinations or paranoia?  Denies.    Seizures?  Denies     Any sleep changes?   Does not sleep well, takes trazodone with some relief. Reports occasionally having vivid dreams, no REM behavior, denies sleepwalking.   Sleep apnea?  Denies   Any hygiene concerns?  Denies   Independent of bathing and dressing?  Endorsed  Does the patient needs help with medications?  Wife is in charge he states that he does. Who is in charge of the finances?  Son is in charge because he was missing bills  and payments  for the business Any changes in appetite?  He has a history of decreased appetite, doe snot take any supplement    Patient have trouble swallowing? Denies.   Does the patient cook? Yes , denies forgetting common recipes  Any kitchen accidents such as leaving the stove on? Denies.   Any history of headaches?   Denies.   Chronic pain ? Denies.   Ambulates with difficulty?  Denies.  Recent falls or head injuries? he  fell of a bridge hitting concrete as a child. No LOC. He does not report any other falls or imbalance. He does chronic pain managed at pain clinic. Vision changes? Denies any difficulty looking up or down, or involuntary eye movement Unilateral weakness, numbness or tingling? Denies.   Any tremors?   Denies.   Any anosmia?  Denies.   Any incontinence of urine?  Endorsed, has to get up at least 3 times in the night. Any bowel dysfunction? Denies.      Patient lives with wife and his daughter    History of heavy alcohol intake? Brady and scotch at most 2 a day. Also non alcoholic beer. Before he drank 2 bottles a day until 5 years ago  History of heavy tobacco use? Denies.   Family history of dementia? Denies.  Does patient drive?  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  Age advanced supratentorial volume loss most pronounced in the left greater than right temporal and frontal lobes in a configuration that can be seen with primary progressive aphasia. Correlate with neurocognitive evaluation and consider FDG-PET for further evaluation if clinical warranted.  2.  Additional disproportionate atrophy of the midbrain in a configuration that can be seen in progressive supranuclear palsy. If there is clinical concern for Parkinsonian symptoms, DAT scan could also be considered.  3.  No acute intracranial abnormality.  4.  Moderate sequela of chronic microvascular disease.     Past Medical History:  Diagnosis Date   Acute kidney injury (HCC)    Diabetes (HCC)    DKA (diabetic ketoacidoses)    Hyperlipidemia    Hypertension    Small bowel obstruction (HCC) 10/2015     Past Surgical History:  Procedure Laterality Date   BOWEL RESECTION N/A 08/17/2020   Procedure: SMALL BOWEL RESECTION;  Surgeon: Emelia Loron, MD;  Location: [facility] OR;  Service:  General;  Laterality: N/A;   ESOPHAGOGASTRODUODENOSCOPY N/A 10/20/2017   Procedure: ESOPHAGOGASTRODUODENOSCOPY (EGD);  Surgeon: Rachael Fee, MD;  Location: [facility] ENDOSCOPY;  Service: Endoscopy;  Laterality: N/A;   ESOPHAGOGASTRODUODENOSCOPY ENDOSCOPY     GIVENS CAPSULE STUDY N/A 01/31/2018   Procedure: GIVENS CAPSULE STUDY;  Surgeon: Rachael Fee, MD;  Location: [facility] ENDOSCOPY;  Service: Endoscopy;  Laterality: N/A;   LAPAROSCOPY N/A 08/17/2020   Procedure: LAPAROSCOPY DIAGNOSTIC;  Surgeon: Emelia Loron, MD;  Location: [facility] OR;  Service: General;  Laterality: N/A;   LAPAROTOMY N/A 08/17/2020   Procedure: EXPLORATORY LAPAROTOMY;  Surgeon: Emelia Loron, MD;  Location: [facility] OR;  Service: General;  Laterality: N/A;   LYSIS OF ADHESION N/A 08/17/2020   Procedure: LYSIS OF ADHESION;  Surgeon: Emelia Loron, MD;  Location: [facility] OR;  Service: General;  Laterality: N/A;   TONSILLECTOMY     patient denies   WRIST SURGERY       Allergies  Allergen Reactions   Malarone [Atovaquone-Proguanil Hcl] Itching  Current Outpatient Medications  Medication Instructions   acetaminophen (TYLENOL) 325 MG tablet You can take 2 tablets every 6 hours as needed for pain.  Do not take more than 4000 mg of Tylenol per day.  You can buy this over-the-counter at any drugstore.   amLODipine (NORVASC) 5 mg, Daily   aspirin 81 mg, Daily   celecoxib (CELEBREX) 100 MG capsule Take by mouth.   cyclobenzaprine (FLEXERIL) 5 mg, 3 times daily PRN   ibuprofen (ADVIL) 800 mg, 2 times daily   lisinopril (ZESTRIL) 40 mg, Daily   lisinopril-hydrochlorothiazide (ZESTORETIC) 20-12.5 MG tablet 1 tablet, 2 times daily   magnesium oxide (MAG-OX) 400 mg, Oral, 2 times daily   memantine (NAMENDA) 5 MG tablet Take 1 tablet (5 mg at night) for 2 weeks, then increase to 1 tablet (5 mg) twice a day   metFORMIN (GLUCOPHAGE) 1,000 mg, 2 times daily with meals   Multiple Vitamin (MULTIVITAMIN WITH MINERALS) TABS tablet 1 tablet,  Oral, Daily   NEEDLE (DISP) 30 G (BD DISP NEEDLES) 10 Units, Does not apply, Daily   oxyCODONE (OXY IR/ROXICODONE) 5 mg, Oral, Every 4 hours PRN   pantoprazole (PROTONIX) 40 mg, Oral, 2 times daily before meals   pravastatin (PRAVACHOL) 20 mg, Daily   solifenacin (VESICARE) 10 mg, Daily   traZODone (DESYREL) 50 MG tablet Take by mouth.   verapamil (CALAN-SR) 180 MG CR tablet 1 tablet, Daily   Vitamin D, Ergocalciferol, (DRISDOL) 1.25 MG (50000 UNIT) CAPS capsule Take by mouth.     VITALS:   Vitals:   11/03/23 0928  BP: (!) 166/106  Pulse: 83  SpO2: 98%  Weight: 182 lb (82.6 kg)  Height: 5\' 6"  (1.676 m)      PHYSICAL EXAM   HEENT:  Normocephalic, atraumatic. The superficial temporal arteries are without ropiness or tenderness. Cardiovascular: Regular rate and rhythm. Lungs: Clear to auscultation bilaterally. Neck: There are no carotid bruits noted bilaterally.  NEUROLOGICAL:    11/03/2023   11:00 AM  Montreal Cognitive Assessment   Visuospatial/ Executive (0/5) 2  Naming (0/3) 1  Attention: Read list of digits (0/2) 2  Attention: Read list of letters (0/1) 0  Attention: Serial 7 subtraction starting at 100 (0/3) 3  Language: Repeat phrase (0/2) 0  Language : Fluency (0/1) 0  Abstraction (0/2) 1  Delayed Recall (0/5) 0  Orientation (0/6) 4  Total 13  Adjusted Score (based on education) 13        No data to display           Orientation:  Alert and oriented to person, place and time. No aphasia or dysarthria. He has a heavy [place] accent.Effie Shy of knowledge is appropriate. Recent memory impaired and remote memory intact.  Attention and concentration are normal.  Able to name objects and repeat phrases. Delayed recall  0/5 Cranial nerves: There is good facial symmetry. No hypomimia .Extraocular muscles are intact and visual fields are full to confrontational testing. Speech is not very fluent  but clear, no hypophonia . No tongue deviation. Hearing is intact to  conversational tone. Tone: Tone is good throughout.. No cogwheeling, No tremors Sensation: Sensation is intact to light touch. Vibration is intact at the bilateral big toe.  Coordination: The patient has no difficulty with RAM's or FNF bilaterally. Normal finger to nose  Motor: Strength is 5/5 in the bilateral upper and lower extremities. There is no pronator drift. There are no fasciculations noted. DTR's: Deep tendon reflexes are 2/4 bilaterally.  Gait and Station: The patient is able to ambulate with some difficulty due to arthritis. Unable to heel toe walk . Gait is cautious, stable, attributed to chronic pain in his back..   The patient is able to ambulate in a tandem fashion.       Thank you for allowing [place] the opportunity to participate in the care of this nice patient. Please do not hesitate to contact us for any questions or concerns.   Total time spent on today's visit was 62 minutes dedicated to this patient today, preparing to see patient, examining the patient, ordering tests and/or medications and counseling the patient, documenting clinical information in the EHR or other health record, independently interpreting results and communicating results to the patient/family, discussing treatment and goals, answering patient's questions and coordinating care.  Cc:  Elie Confer, NP  Marlowe Kays 11/03/2023 12:23 PM

## 2023-11-03 ENCOUNTER — Ambulatory Visit: Payer: Medicare HMO

## 2023-11-03 ENCOUNTER — Other Ambulatory Visit: Payer: Medicare HMO

## 2023-11-03 ENCOUNTER — Encounter: Payer: Self-pay | Admitting: Physician Assistant

## 2023-11-03 ENCOUNTER — Ambulatory Visit: Payer: Medicare HMO | Admitting: Physician Assistant

## 2023-11-03 VITALS — BP 166/106 | HR 83 | Ht 66.0 in | Wt 182.0 lb

## 2023-11-03 DIAGNOSIS — R413 Other amnesia: Secondary | ICD-10-CM

## 2023-11-03 MED ORDER — MEMANTINE HCL 5 MG PO TABS: ORAL_TABLET | ORAL | 0 refills | Status: DC

## 2023-11-03 NOTE — Patient Instructions (Signed)
It was a pleasure to see you today at our office.   Recommendations:  Check labs today   Follow up in 3 months    For psychiatric meds, mood meds: Please have your primary care physician manage these medications.  If you have any severe symptoms of a stroke, or other severe issues such as confusion,severe chills or fever, etc call 911 or go to the ER as you may need to be evaluated further    For assessment of decision of mental capacity and competency:  Call Dr. Erick Blinks, geriatric psychiatrist at [phone]  Counseling regarding caregiver distress, including caregiver depression, anxiety and issues regarding community resources, adult day care programs, adult living facilities, or memory care questions:  please contact your  Primary Doctor's Social Worker   Whom to call: Memory  decline, memory medications: Call our office [phone]    [email or web address]   RECOMMENDATIONS FOR ALL PATIENTS WITH MEMORY PROBLEMS: 1. Continue to exercise (Recommend 30 minutes of walking everyday, or 3 hours every week) 2. Increase social interactions - continue going to [place] and enjoy social gatherings with friends and family 3. Eat healthy, avoid fried foods and eat more fruits and vegetables 4. Maintain adequate blood pressure, blood sugar, and blood cholesterol level. Reducing the risk of stroke and cardiovascular disease also helps promoting better memory. 5. Avoid stressful situations. Live a simple life and avoid aggravations. Organize your time and prepare for the next day in anticipation. 6. Sleep well, avoid any interruptions of sleep and avoid any distractions in the bedroom that may interfere with adequate sleep quality 7. Avoid sugar, avoid sweets as there is a strong link between excessive sugar intake, diabetes, and cognitive impairment We discussed the Mediterranean diet, which has been shown to help patients reduce the  risk of progressive memory disorders and reduces cardiovascular risk. This includes eating fish, eat fruits and green leafy vegetables, nuts like almonds and hazelnuts, walnuts, and also use olive oil. Avoid fast foods and fried foods as much as possible. Avoid sweets and sugar as sugar use has been linked to worsening of memory function.  There is always a concern of gradual progression of memory problems. If this is the case, then we may need to adjust level of care according to patient needs. Support, both to the patient and caregiver, should then be put into place.         DRIVING: Regarding driving, in patients with progressive memory problems, driving will be impaired. We advise to have someone else do the driving if trouble finding directions or if minor accidents are reported. Independent driving assessment is available to determine safety of driving.   If you are interested in the driving assessment, you can contact the following:  [employer] in [place] [phone]  [facility] [phone]  [facility] [phone]  [facility] [phone] or [phone]   FALL PRECAUTIONS: Be cautious when walking. Scan the area for obstacles that may increase the risk of trips and falls. When getting up in the mornings, sit up at the edge of the bed for a few minutes before getting out of bed. Consider elevating the bed at the head end to avoid drop of blood pressure when getting up. Walk always in a well-lit room (use night lights in the walls). Avoid area rugs or power cords from appliances in the middle of the walkways. Use a walker or a cane if necessary and consider physical therapy for balance exercise. Get your eyesight  checked regularly.  FINANCIAL OVERSIGHT: Supervision, especially oversight when making financial decisions or transactions is also recommended.  HOME SAFETY: Consider the safety of the kitchen when operating appliances  like stoves, microwave oven, and blender. Consider having supervision and share cooking responsibilities until no longer able to participate in those. Accidents with firearms and other hazards in the house should be identified and addressed as well.   ABILITY TO BE LEFT ALONE: If patient is unable to contact 911 operator, consider using LifeLine, or when the need is there, arrange for someone to stay with patients. Smoking is a fire hazard, consider supervision or cessation. Risk of wandering should be assessed by caregiver and if detected at any point, supervision and safe proof recommendations should be instituted.  MEDICATION SUPERVISION: Inability to self-administer medication needs to be constantly addressed. Implement a mechanism to ensure safe administration of the medications.      Mediterranean Diet A Mediterranean diet refers to food and lifestyle choices that are based on the traditions of countries located on the [employer] [place]. This way of eating has been shown to help prevent certain conditions and improve outcomes for people who have chronic diseases, like kidney disease and heart disease. What are tips for following this plan? Lifestyle  Cook and eat meals together with your family, when possible. Drink enough fluid to keep your urine clear or pale yellow. Be physically active every day. This includes: Aerobic exercise like running or swimming. Leisure activities like gardening, walking, or housework. Get 7-8 hours of sleep each night. If recommended by your health care provider, drink red wine in moderation. This means 1 glass a day for nonpregnant women and 2 glasses a day for men. A glass of wine equals 5 oz (150 mL). Reading food labels  Check the serving size of packaged foods. For foods such as rice and pasta, the serving size refers to the amount of cooked product, not dry. Check the total fat in packaged foods. Avoid foods that have saturated fat or trans  fats. Check the ingredients list for added sugars, such as corn syrup. Shopping  At the grocery store, buy most of your food from the areas near the walls of the store. This includes: Fresh fruits and vegetables (produce). Grains, beans, nuts, and seeds. Some of these may be available in unpackaged forms or large amounts (in bulk). Fresh seafood. Poultry and eggs. Low-fat dairy products. Buy whole ingredients instead of prepackaged foods. Buy fresh fruits and vegetables in-season from local farmers markets. Buy frozen fruits and vegetables in resealable bags. If you do not have access to quality fresh seafood, buy precooked frozen shrimp or canned fish, such as tuna, salmon, or sardines. Buy small amounts of raw or cooked vegetables, salads, or olives from the deli or salad bar at your store. Stock your pantry so you always have certain foods on hand, such as olive oil, canned tuna, canned tomatoes, rice, pasta, and beans. Cooking  Cook foods with extra-virgin olive oil instead of using butter or other vegetable oils. Have meat as a side dish, and have vegetables or grains as your main dish. This means having meat in small portions or adding small amounts of meat to foods like pasta or stew. Use beans or vegetables instead of meat in common dishes like chili or lasagna. Experiment with different cooking methods. Try roasting or broiling vegetables instead of steaming or sauteing them. Add frozen vegetables to soups, stews, pasta, or rice. Add nuts or seeds for added healthy  fat at each meal. You can add these to yogurt, salads, or vegetable dishes. Marinate fish or vegetables using olive oil, lemon juice, garlic, and fresh herbs. Meal planning  Plan to eat 1 vegetarian meal one day each week. Try to work up to 2 vegetarian meals, if possible. Eat seafood 2 or more times a week. Have healthy snacks readily available, such as: Vegetable sticks with hummus. Greek yogurt. Fruit and nut  trail mix. Eat balanced meals throughout the week. This includes: Fruit: 2-3 servings a day Vegetables: 4-5 servings a day Low-fat dairy: 2 servings a day Fish, poultry, or lean meat: 1 serving a day Beans and legumes: 2 or more servings a week Nuts and seeds: 1-2 servings a day Whole grains: 6-8 servings a day Extra-virgin olive oil: 3-4 servings a day Limit red meat and sweets to only a few servings a month What are my food choices? Mediterranean diet Recommended Grains: Whole-grain pasta. Brown rice. Bulgar wheat. Polenta. Couscous. Whole-wheat bread. Orpah Cobb. Vegetables: Artichokes. Beets. Broccoli. Cabbage. Carrots. Eggplant. Green beans. Chard. Kale. Spinach. Onions. Leeks. Peas. Squash. Tomatoes. Peppers. Radishes. Fruits: Apples. Apricots. Avocado. Berries. Bananas. Cherries. Dates. Figs. Grapes. Lemons. Melon. Oranges. Peaches. Plums. Pomegranate. Meats and other protein foods: Beans. Almonds. Sunflower seeds. Pine nuts. Peanuts. Cod. Salmon. Scallops. Shrimp. Tuna. Tilapia. Clams. Oysters. Eggs. Dairy: Low-fat milk. Cheese. Greek yogurt. Beverages: Water. Red wine. Herbal tea. Fats and oils: Extra virgin olive oil. Avocado oil. Grape seed oil. Sweets and desserts: [place] yogurt with honey. Baked apples. Poached pears. Trail mix. Seasoning and other foods: Basil. Cilantro. Coriander. Cumin. Mint. Parsley. Sage. Rosemary. Tarragon. Garlic. Oregano. Thyme. Pepper. Balsalmic vinegar. Tahini. Hummus. Tomato sauce. Olives. Mushrooms. Limit these Grains: Prepackaged pasta or rice dishes. Prepackaged cereal with added sugar. Vegetables: Deep fried potatoes (french fries). Fruits: Fruit canned in syrup. Meats and other protein foods: Beef. Pork. Lamb. Poultry with skin. Hot dogs. Tomasa Blase. Dairy: Ice cream. Sour cream. Whole milk. Beverages: Juice. Sugar-sweetened soft drinks. Beer. Liquor and spirits. Fats and oils: Butter. Canola oil. Vegetable oil. Beef fat (tallow).  Lard. Sweets and desserts: Cookies. Cakes. Pies. Candy. Seasoning and other foods: Mayonnaise. Premade sauces and marinades. The items listed may not be a complete list. Talk with your dietitian about what dietary choices are right for you. Summary The Mediterranean diet includes both food and lifestyle choices. Eat a variety of fresh fruits and vegetables, beans, nuts, seeds, and whole grains. Limit the amount of red meat and sweets that you eat. Talk with your health care provider about whether it is safe for you to drink red wine in moderation. This means 1 glass a day for nonpregnant women and 2 glasses a day for men. A glass of wine equals 5 oz (150 mL). This information is not intended to replace advice given to you by your health care provider. Make sure you discuss any questions you have with your health care provider. Document Released: 06/25/2016 Document Revised: 07/28/2016 Document Reviewed: 06/25/2016 Elsevier Interactive Patient Education  2017 [employer].

## 2023-11-04 NOTE — Progress Notes (Signed)
B12 and thyroid are normal, thanks

## 2023-11-07 LAB — VITAMIN B1: Vitamin B1 (Thiamine): <6 nmol/L — ABNORMAL LOW (ref 8–30)

## 2023-11-07 LAB — VITAMIN B12: Vitamin B-12: 471 pg/mL (ref 200–1100)

## 2023-11-07 LAB — TSH: TSH: 2.83 m[IU]/L (ref 0.40–4.50)

## 2023-11-08 NOTE — Progress Notes (Signed)
Unable to leave a message at 11/08/2023 at 10:06am

## 2023-11-08 NOTE — Progress Notes (Signed)
B1 is low, recommend over the counter B1 100 mg daily. Alcohol cessation is recommended thanks

## 2023-11-19 ENCOUNTER — Telehealth: Payer: Self-pay | Admitting: Physician Assistant

## 2023-11-19 NOTE — Telephone Encounter (Signed)
 Pt's wife came in and gave us  a copy of a letter. She needs a letter written stating the pt is a patient here and was diagnosed in December 2024 with memory loss. 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 Pt's wife can be contacted at [phone] when it is ready to pick up.  I am placing the letter she gave me in [facility] box.

## 2023-11-21 ENCOUNTER — Encounter: Payer: Self-pay | Admitting: Physician Assistant

## 2023-12-03 NOTE — Telephone Encounter (Signed)
Pt's wife called in stating she had called previously about a letter, but has not gotten a call back for her to come pick it up. There is not a letter up front.

## 2023-12-06 ENCOUNTER — Encounter: Payer: Self-pay | Admitting: Physician Assistant

## 2024-02-03 ENCOUNTER — Ambulatory Visit: Payer: Medicare HMO | Admitting: Physician Assistant

## 2024-02-08 ENCOUNTER — Ambulatory Visit: Payer: Medicare HMO | Admitting: Physician Assistant

## 2024-02-11 ENCOUNTER — Encounter: Payer: Self-pay | Admitting: Physician Assistant

## 2024-02-11 ENCOUNTER — Other Ambulatory Visit: Payer: Self-pay | Admitting: Physician Assistant

## 2024-02-11 ENCOUNTER — Ambulatory Visit (INDEPENDENT_AMBULATORY_CARE_PROVIDER_SITE_OTHER): Payer: Medicare HMO | Admitting: Physician Assistant

## 2024-02-11 VITALS — BP 140/80 | HR 87 | Resp 20 | Ht 66.0 in | Wt 176.0 lb

## 2024-02-11 DIAGNOSIS — R413 Other amnesia: Secondary | ICD-10-CM | POA: Diagnosis not present

## 2024-02-11 MED ORDER — MEMANTINE HCL 10 MG PO TABS: ORAL_TABLET | ORAL | 11 refills | Status: AC

## 2024-02-11 NOTE — Progress Notes (Signed)
 Assessment/Plan:   Memory Impairment if unclear etiology   Jeremiah Bowman is a very pleasant 68 y.o. RH male with a history of hypertension, hyperlipidemia, alcohol dependence, DM2, prolonged QT vitamin D deficiency, chronic back pain, insomnia, habituation, history of recurrent partial SBO and dementia of unclear etiology per neuropsych evaluation 09/2023 at [facility] seen today in follow up for memory loss. Patient is currently on memantine 5 mg bid. Memory is stable. Neuropsych recommended PET scan as a diagnostic tool, rule out AD Patient is able to participate on ADLs , does not drive.       Follow up in 6  months. Continue Memantine, increase to 10 mg twice daily. Side effects were discussed  (Qti) Metabolic PET scan as recommended be Neuropsych evaluation  Replenish B1 (less than 6) Recommend good control of her cardiovascular risk factors. Continue baby ASA.  Continue to control mood as per PCP Monitor the use of muscle relaxers as this could contribute to memory issues Alcohol cessation counseled. Continues to drink Valentina Lucks    Subjective:    This patient is accompanied in the office by his wife who supplements the history.  Previous records as well as any outside records available were reviewed prior to todays visit. Patient was last seen on 11/03/23 with [facility] 18/30     Any changes in memory since last visit? " He is slacking on his job"-wife says. Sits in one place for a long time. Does not engage in activities. Does not appear to be interested in things, she says repeats oneself?  Endorsed. Disoriented when walking into a room?  Patient denies    Leaving objects?  May misplace things but not in unusual places   Wandering behavior?  denies   Any personality changes since last visit?  Denies. During the visit he has been very argumentative towards his wife.   Any worsening depression?:  Denies.   Hallucinations or paranoia?  Denies.   Seizures? denies    Any sleep  changes?  Denies vivid dreams, REM behavior or sleepwalking   Sleep apnea?   Denies.   Any hygiene concerns? Denies.  Independent of bathing and dressing?  Endorsed  Does the patient needs help with medications?  Wife is in charge   Who is in charge of the finances? Wife  is in charge     Any changes in appetite?  denies     Patient have trouble swallowing? Denies.   Does the patient cook? No Any headaches?   denies   Chronic back pain  denies   Ambulates with difficulty? Denies.    Recent falls or head injuries? denies     Unilateral weakness, numbness or tingling? denies   Any tremors?  Denies   Any anosmia?  Denies   Any incontinence of urine?  Denies  Any bowel dysfunction?   Denies      Patient lives with wife  Does the patient drive? No longer drives    Initial visit 11/03/23 How long did patient have memory difficulties?  Patient denies any issues, his wife is concerned for the last 2 years.  Reports some difficulty remembering new information, conversations and names as well as appointments. "Sometimes I have a hard time recalling the word".  She has kept a journal to document this changes when he denies it.  "He is just not interested in anything ".  He has not been meeting deadlines.  Long-term memory is good.  He watches TV most of the day.  He stopped going to [place] several months ago.  He does not do any brain games repeats oneself?  Endorsed by his wife, within 15 minutes. Disoriented when walking into a room?  Patient denies except occasionally not remembering what patient came to the room for    Leaving objects in unusual places? Denies.   Wandering behavior?  denies .  Any personality changes?  His wife reports that he is not interested in any changes before, he has more apathy, decreased motivation. "He used to do things as a family and he is not interested in socializing" Any history of depression?:  Denies, but his wife suspecting he does.    Hallucinations or  paranoia?  Denies.    Seizures?  Denies     Any sleep changes?   Does not sleep well, takes trazodone with some relief. Reports occasionally having vivid dreams, no REM behavior, denies sleepwalking.   Sleep apnea?  Denies   Any hygiene concerns?  Denies   Independent of bathing and dressing?  Endorsed  Does the patient needs help with medications?  Wife is in charge he states that he does. Who is in charge of the finances?  Son is in charge because he was missing bills and payments  for the business Any changes in appetite?  He has a history of decreased appetite, doe snot take any supplement    Patient have trouble swallowing? Denies.   Does the patient cook? Yes , denies forgetting common recipes  Any kitchen accidents such as leaving the stove on? Denies.   Any history of headaches?   Denies.   Chronic pain ? Denies.   Ambulates with difficulty?  Denies.  Recent falls or head injuries? he fell of a bridge hitting concrete as a child. No LOC. He does not report any other falls or imbalance. He does chronic pain managed at pain clinic. Vision changes? Denies any difficulty looking up or down, or involuntary eye movement Unilateral weakness, numbness or tingling? Denies.   Any tremors?   Denies.   Any anosmia?  Denies.   Any incontinence of urine?  Endorsed, has to get up at least 3 times in the night. Any bowel dysfunction? Denies.      Patient lives with wife and his daughter    History of heavy alcohol intake? Brady and scotch at most 2 a day. Also non alcoholic beer. Before he drank 2 bottles a day until 5 years ago  History of heavy tobacco use? Denies.   Family history of dementia? Denies.  Does patient drive?  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  Age advanced supratentorial volume loss most pronounced in the left greater than  right temporal and frontal lobes in a configuration that can be seen with primary progressive aphasia. Correlate with neurocognitive evaluation and consider FDG-PET for further evaluation if clinical warranted.  2.  Additional disproportionate atrophy of the midbrain in a configuration that can be seen in progressive supranuclear palsy. If there is clinical concern for Parkinsonian symptoms, DAT scan could also be considered.  3.  No acute intracranial abnormality.  4.  Moderate sequela of chronic microvascular disease.    PREVIOUS MEDICATIONS:   CURRENT MEDICATIONS:  Outpatient Encounter Medications as of 02/11/2024  Medication Sig   amLODipine (NORVASC) 5 MG tablet Take 5 mg by mouth daily.   aspirin 81  MG chewable tablet Chew 81 mg by mouth daily.   lisinopril (ZESTRIL) 40 MG tablet Take 40 mg by mouth daily.   pantoprazole (PROTONIX) 40 MG tablet Take 1 tablet (40 mg total) by mouth 2 (two) times daily before a meal.   pravastatin (PRAVACHOL) 20 MG tablet Take 20 mg by mouth daily.   Vitamin D, Ergocalciferol, (DRISDOL) 1.25 MG (50000 UNIT) CAPS capsule Take by mouth.   [DISCONTINUED] memantine (NAMENDA) 5 MG tablet TAKE 1 TABLET BY MOUTH AT NIGHT FOR 2 WEEKS; THEN INCREASE TO TAKING 1 TABLET TWICE DAILY   acetaminophen (TYLENOL) 325 MG tablet You can take 2 tablets every 6 hours as needed for pain.  Do not take more than 4000 mg of Tylenol per day.  You can buy this over-the-counter at any drugstore. (Patient not taking: Reported on 11/03/2023)   cyclobenzaprine (FLEXERIL) 5 MG tablet Take 5 mg by mouth 3 (three) times daily as needed for muscle spasms. (Patient not taking: Reported on 11/03/2023)   ibuprofen (ADVIL) 800 MG tablet Take 800 mg by mouth in the morning and at bedtime.   lisinopril-hydrochlorothiazide (ZESTORETIC) 20-12.5 MG tablet Take 1 tablet by mouth 2 (two) times daily. (Patient not taking: Reported on 11/03/2023)   magnesium oxide (MAG-OX) 400 (241.3 Mg) MG tablet Take 1  tablet (400 mg total) by mouth 2 (two) times daily. (Patient not taking: Reported on 11/03/2023)   memantine (NAMENDA) 10 MG tablet TAKE 1 TABLET  TWICE DAILY   metFORMIN (GLUCOPHAGE) 1000 MG tablet Take 1,000 mg by mouth 2 (two) times daily with a meal. (Patient not taking: Reported on 11/03/2023)   Multiple Vitamin (MULTIVITAMIN WITH MINERALS) TABS tablet Take 1 tablet by mouth daily.   NEEDLE, DISP, 30 G (BD DISP NEEDLES) 30G X 1/2" MISC 10 Units by Does not apply route daily.   oxyCODONE (OXY IR/ROXICODONE) 5 MG immediate release tablet Take 1 tablet (5 mg total) by mouth every 4 (four) hours as needed for breakthrough pain. (Patient not taking: Reported on 11/03/2023)   solifenacin (VESICARE) 10 MG tablet Take 10 mg by mouth daily. (Patient not taking: Reported on 11/03/2023)   traZODone (DESYREL) 50 MG tablet Take by mouth. (Patient not taking: Reported on 02/11/2024)   verapamil (CALAN-SR) 180 MG CR tablet Take 1 tablet by mouth daily. (Patient not taking: Reported on 11/03/2023)   [DISCONTINUED] memantine (NAMENDA) 5 MG tablet Take 1 tablet (5 mg at night) for 2 weeks, then increase to 1 tablet (5 mg) twice a day   No facility-administered encounter medications on file as of 02/11/2024.       02/11/2024    2:00 PM  MMSE - Mini Mental State Exam  Orientation to time 5  Orientation to Place 4  Registration 3  Attention/ Calculation 5  Recall 0  Language- name 2 objects 2  Language- repeat 1  Language- follow 3 step command 3  Language- read & follow direction 1  Write a sentence 1  Copy design 0  Total score 25      11/03/2023   11:00 AM  Montreal Cognitive Assessment   Visuospatial/ Executive (0/5) 2  Naming (0/3) 1  Attention: Read list of digits (0/2) 2  Attention: Read list of letters (0/1) 0  Attention: Serial 7 subtraction starting at 100 (0/3) 3  Language: Repeat phrase (0/2) 0  Language : Fluency (0/1) 0  Abstraction (0/2) 1  Delayed Recall (0/5) 0  Orientation  (0/6) 4  Total 13  Adjusted Score (based  on education) 13    Objective:     PHYSICAL EXAMINATION:    VITALS:   Vitals:   02/11/24 1413 02/11/24 1424  BP: (!) 150/80 (!) 140/80  Pulse: 87   Resp: 20   SpO2: (!) 10%   Weight: 176 lb (79.8 kg)   Height: 5\' 6"  (1.676 m)     GEN:  The patient appears stated age and is in NAD. HEENT:  Normocephalic, atraumatic.   Neurological examination:  General: NAD, well-groomed, appears stated age. Orientation: The patient is alert. Oriented to person, place and date Cranial nerves: There is good facial symmetry. Anxious appearing. The speech is fluent and clear. No aphasia or dysarthria. Fund of knowledge is appropriate. Recent and remote memory are impaired. Attention and concentration are reduced.  Able to name objects and repeat phrases.  Hearing is intact to conversational tone.   Sensation: Sensation is intact to light touch throughout Motor: Strength is at least antigravity x4. DTR's 2/4 in UE/LE     Movement examination: Tone: There is normal tone in the UE/LE Abnormal movements:  no tremor.  No myoclonus.  No asterixis.   Coordination:  There is no decremation with RAM's. Normal finger to nose  Gait and Station: The patient has no difficulty arising out of a deep-seated chair without the use of the hands. The patient's stride length is good.  Gait is cautious and narrow.    Thank you for allowing [place] the opportunity to participate in the care of this nice patient. Please do not hesitate to contact us for any questions or concerns.   Total time spent on today's visit was 31 minutes dedicated to this patient today, preparing to see patient, examining the patient, ordering tests and/or medications and counseling the patient, documenting clinical information in the EHR or other health record, independently interpreting results and communicating results to the patient/family, discussing treatment and goals, answering patient's questions  and coordinating care.  Cc:  Elie Confer, NP  Marlowe Kays 02/13/2024 5:04 PM

## 2024-02-11 NOTE — Patient Instructions (Addendum)
 It was a pleasure to see you today at our office.   Recommendations:   Increase memantine to 10 mg twice a day  Continue taking B1 Follow up in 6 months     For psychiatric meds, mood meds: Please have your primary care physician manage these medications.  If you have any severe symptoms of a stroke, or other severe issues such as confusion,severe chills or fever, etc call 911 or go to the ER as you may need to be evaluated further    For assessment of decision of mental capacity and competency:  Call Dr. Erick Blinks, geriatric psychiatrist at [phone]  Counseling regarding caregiver distress, including caregiver depression, anxiety and issues regarding community resources, adult day care programs, adult living facilities, or memory care questions:  please contact your  Primary Doctor's Social Worker   Whom to call: Memory  decline, memory medications: Call our office [phone]    [email or web address]   RECOMMENDATIONS FOR ALL PATIENTS WITH MEMORY PROBLEMS: 1. Continue to exercise (Recommend 30 minutes of walking everyday, or 3 hours every week) 2. Increase social interactions - continue going to [place] and enjoy social gatherings with friends and family 3. Eat healthy, avoid fried foods and eat more fruits and vegetables 4. Maintain adequate blood pressure, blood sugar, and blood cholesterol level. Reducing the risk of stroke and cardiovascular disease also helps promoting better memory. 5. Avoid stressful situations. Live a simple life and avoid aggravations. Organize your time and prepare for the next day in anticipation. 6. Sleep well, avoid any interruptions of sleep and avoid any distractions in the bedroom that may interfere with adequate sleep quality 7. Avoid sugar, avoid sweets as there is a strong link between excessive sugar intake, diabetes, and cognitive impairment We discussed the Mediterranean diet, which  has been shown to help patients reduce the risk of progressive memory disorders and reduces cardiovascular risk. This includes eating fish, eat fruits and green leafy vegetables, nuts like almonds and hazelnuts, walnuts, and also use olive oil. Avoid fast foods and fried foods as much as possible. Avoid sweets and sugar as sugar use has been linked to worsening of memory function.  There is always a concern of gradual progression of memory problems. If this is the case, then we may need to adjust level of care according to patient needs. Support, both to the patient and caregiver, should then be put into place.         DRIVING: Regarding driving, in patients with progressive memory problems, driving will be impaired. We advise to have someone else do the driving if trouble finding directions or if minor accidents are reported. Independent driving assessment is available to determine safety of driving.   If you are interested in the driving assessment, you can contact the following:  [facility] [employer] in [place] [phone]  [facility] [phone]  [facility] [phone]  [facility] [phone] or [phone]   FALL PRECAUTIONS: Be cautious when walking. Scan the area for obstacles that may increase the risk of trips and falls. When getting up in the mornings, sit up at the edge of the bed for a few minutes before getting out of bed. Consider elevating the bed at the head end to avoid drop of blood pressure when getting up. Walk always in a well-lit room (use night lights in the walls). Avoid area rugs or power cords from appliances in the middle of the walkways. Use a walker or a cane if necessary and  consider physical therapy for balance exercise. Get your eyesight checked regularly.  FINANCIAL OVERSIGHT: Supervision, especially oversight when making financial decisions or transactions is also recommended.  HOME SAFETY: Consider the  safety of the kitchen when operating appliances like stoves, microwave oven, and blender. Consider having supervision and share cooking responsibilities until no longer able to participate in those. Accidents with firearms and other hazards in the house should be identified and addressed as well.   ABILITY TO BE LEFT ALONE: If patient is unable to contact 911 operator, consider using LifeLine, or when the need is there, arrange for someone to stay with patients. Smoking is a fire hazard, consider supervision or cessation. Risk of wandering should be assessed by caregiver and if detected at any point, supervision and safe proof recommendations should be instituted.  MEDICATION SUPERVISION: Inability to self-administer medication needs to be constantly addressed. Implement a mechanism to ensure safe administration of the medications.      Mediterranean Diet A Mediterranean diet refers to food and lifestyle choices that are based on the traditions of countries located on the [employer] [place]. This way of eating has been shown to help prevent certain conditions and improve outcomes for people who have chronic diseases, like kidney disease and heart disease. What are tips for following this plan? Lifestyle  Cook and eat meals together with your family, when possible. Drink enough fluid to keep your urine clear or pale yellow. Be physically active every day. This includes: Aerobic exercise like running or swimming. Leisure activities like gardening, walking, or housework. Get 7-8 hours of sleep each night. If recommended by your health care provider, drink red wine in moderation. This means 1 glass a day for nonpregnant women and 2 glasses a day for men. A glass of wine equals 5 oz (150 mL). Reading food labels  Check the serving size of packaged foods. For foods such as rice and pasta, the serving size refers to the amount of cooked product, not dry. Check the total fat in packaged foods. Avoid  foods that have saturated fat or trans fats. Check the ingredients list for added sugars, such as corn syrup. Shopping  At the grocery store, buy most of your food from the areas near the walls of the store. This includes: Fresh fruits and vegetables (produce). Grains, beans, nuts, and seeds. Some of these may be available in unpackaged forms or large amounts (in bulk). Fresh seafood. Poultry and eggs. Low-fat dairy products. Buy whole ingredients instead of prepackaged foods. Buy fresh fruits and vegetables in-season from local farmers markets. Buy frozen fruits and vegetables in resealable bags. If you do not have access to quality fresh seafood, buy precooked frozen shrimp or canned fish, such as tuna, salmon, or sardines. Buy small amounts of raw or cooked vegetables, salads, or olives from the deli or salad bar at your store. Stock your pantry so you always have certain foods on hand, such as olive oil, canned tuna, canned tomatoes, rice, pasta, and beans. Cooking  Cook foods with extra-virgin olive oil instead of using butter or other vegetable oils. Have meat as a side dish, and have vegetables or grains as your main dish. This means having meat in small portions or adding small amounts of meat to foods like pasta or stew. Use beans or vegetables instead of meat in common dishes like chili or lasagna. Experiment with different cooking methods. Try roasting or broiling vegetables instead of steaming or sauteing them. Add frozen vegetables to soups, stews, pasta,  or rice. Add nuts or seeds for added healthy fat at each meal. You can add these to yogurt, salads, or vegetable dishes. Marinate fish or vegetables using olive oil, lemon juice, garlic, and fresh herbs. Meal planning  Plan to eat 1 vegetarian meal one day each week. Try to work up to 2 vegetarian meals, if possible. Eat seafood 2 or more times a week. Have healthy snacks readily available, such as: Vegetable sticks with  hummus. Greek yogurt. Fruit and nut trail mix. Eat balanced meals throughout the week. This includes: Fruit: 2-3 servings a day Vegetables: 4-5 servings a day Low-fat dairy: 2 servings a day Fish, poultry, or lean meat: 1 serving a day Beans and legumes: 2 or more servings a week Nuts and seeds: 1-2 servings a day Whole grains: 6-8 servings a day Extra-virgin olive oil: 3-4 servings a day Limit red meat and sweets to only a few servings a month What are my food choices? Mediterranean diet Recommended Grains: Whole-grain pasta. Brown rice. Bulgar wheat. Polenta. Couscous. Whole-wheat bread. Orpah Cobb. Vegetables: Artichokes. Beets. Broccoli. Cabbage. Carrots. Eggplant. Green beans. Chard. Kale. Spinach. Onions. Leeks. Peas. Squash. Tomatoes. Peppers. Radishes. Fruits: Apples. Apricots. Avocado. Berries. Bananas. Cherries. Dates. Figs. Grapes. Lemons. Melon. Oranges. Peaches. Plums. Pomegranate. Meats and other protein foods: Beans. Almonds. Sunflower seeds. Pine nuts. Peanuts. Cod. Salmon. Scallops. Shrimp. Tuna. Tilapia. Clams. Oysters. Eggs. Dairy: Low-fat milk. Cheese. Greek yogurt. Beverages: Water. Red wine. Herbal tea. Fats and oils: Extra virgin olive oil. Avocado oil. Grape seed oil. Sweets and desserts: [place] yogurt with honey. Baked apples. Poached pears. Trail mix. Seasoning and other foods: Basil. Cilantro. Coriander. Cumin. Mint. Parsley. Sage. Rosemary. Tarragon. Garlic. Oregano. Thyme. Pepper. Balsalmic vinegar. Tahini. Hummus. Tomato sauce. Olives. Mushrooms. Limit these Grains: Prepackaged pasta or rice dishes. Prepackaged cereal with added sugar. Vegetables: Deep fried potatoes (french fries). Fruits: Fruit canned in syrup. Meats and other protein foods: Beef. Pork. Lamb. Poultry with skin. Hot dogs. Tomasa Blase. Dairy: Ice cream. Sour cream. Whole milk. Beverages: Juice. Sugar-sweetened soft drinks. Beer. Liquor and spirits. Fats and oils: Butter. Canola oil.  Vegetable oil. Beef fat (tallow). Lard. Sweets and desserts: Cookies. Cakes. Pies. Candy. Seasoning and other foods: Mayonnaise. Premade sauces and marinades. The items listed may not be a complete list. Talk with your dietitian about what dietary choices are right for you. Summary The Mediterranean diet includes both food and lifestyle choices. Eat a variety of fresh fruits and vegetables, beans, nuts, seeds, and whole grains. Limit the amount of red meat and sweets that you eat. Talk with your health care provider about whether it is safe for you to drink red wine in moderation. This means 1 glass a day for nonpregnant women and 2 glasses a day for men. A glass of wine equals 5 oz (150 mL). This information is not intended to replace advice given to you by your health care provider. Make sure you discuss any questions you have with your health care provider. Document Released: 06/25/2016 Document Revised: 07/28/2016 Document Reviewed: 06/25/2016 Elsevier Interactive Patient Education  2017 [employer].

## 2024-02-16 ENCOUNTER — Telehealth: Payer: Self-pay

## 2024-02-16 NOTE — Telephone Encounter (Signed)
 [place] Pet Metablolic Brain will not be covered under insurance plane. Please advise.

## 2024-02-16 NOTE — Telephone Encounter (Signed)
 Sorry about it, will not be able to perform. Thanks

## 2024-02-23 ENCOUNTER — Telehealth: Payer: Self-pay

## 2024-02-23 NOTE — Telephone Encounter (Signed)
 Left a VM stating that she was returning a call to the office about the patient appt

## 2024-02-23 NOTE — Telephone Encounter (Signed)
 Mail box is full at this time 02/23/2024

## 2024-02-23 NOTE — Telephone Encounter (Signed)
 NM Pet Metabolic Brain was denied, I left message on voicemail several times to have patient call the office. Marlowe Kays, PA-C. Was advised. He needs to follow up as scheduled. [facility] has cancelled the appt. I left message that they had done so.

## 2024-02-23 NOTE — Telephone Encounter (Signed)
 Tried to call wife at 1:56pm no answer again.

## 2024-02-24 NOTE — Telephone Encounter (Signed)
 Spoke with husband , he is going to have his wife call back.

## 2024-08-14 ENCOUNTER — Ambulatory Visit: Admitting: Physician Assistant
